# Patient Record
Sex: Female | Born: 1950 | Race: Asian | Hispanic: No | Smoking: Never smoker
Health system: Southern US, Community
[De-identification: ages and names within clinical notes are randomized; demographics above are authoritative.]

## PROBLEM LIST (undated history)

## (undated) DIAGNOSIS — C569 Malignant neoplasm of unspecified ovary: Secondary | ICD-10-CM

## (undated) DIAGNOSIS — E119 Type 2 diabetes mellitus without complications: Secondary | ICD-10-CM

## (undated) DIAGNOSIS — E079 Disorder of thyroid, unspecified: Secondary | ICD-10-CM

## (undated) DIAGNOSIS — I10 Essential (primary) hypertension: Secondary | ICD-10-CM

## (undated) DIAGNOSIS — E039 Hypothyroidism, unspecified: Secondary | ICD-10-CM

## (undated) DIAGNOSIS — I4891 Unspecified atrial fibrillation: Secondary | ICD-10-CM

## (undated) DIAGNOSIS — B019 Varicella without complication: Secondary | ICD-10-CM

## (undated) DIAGNOSIS — M1990 Unspecified osteoarthritis, unspecified site: Secondary | ICD-10-CM

## (undated) DIAGNOSIS — K219 Gastro-esophageal reflux disease without esophagitis: Secondary | ICD-10-CM

## (undated) DIAGNOSIS — G629 Polyneuropathy, unspecified: Secondary | ICD-10-CM

## (undated) DIAGNOSIS — R51 Headache: Secondary | ICD-10-CM

## (undated) DIAGNOSIS — R519 Headache, unspecified: Secondary | ICD-10-CM

## (undated) DIAGNOSIS — N6320 Unspecified lump in the left breast, unspecified quadrant: Secondary | ICD-10-CM

## (undated) DIAGNOSIS — E785 Hyperlipidemia, unspecified: Secondary | ICD-10-CM

## (undated) HISTORY — DX: Disorder of thyroid, unspecified: E07.9

## (undated) HISTORY — DX: Type 2 diabetes mellitus without complications: E11.9

## (undated) HISTORY — PX: CATARACT EXTRACTION W/ INTRAOCULAR LENS  IMPLANT, BILATERAL: SHX1307

## (undated) HISTORY — DX: Varicella without complication: B01.9

## (undated) HISTORY — PX: HYSTERECTOMY: SHX81

## (undated) HISTORY — PX: CHOLECYSTECTOMY: SHX55

## (undated) HISTORY — PX: APPENDECTOMY (OPEN): SHX54

---
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
## 1982-01-23 HISTORY — PX: APPENDECTOMY: SHX54

## 2009-01-23 HISTORY — PX: CHOLECYSTECTOMY: SHX55

## 2010-05-24 HISTORY — PX: ABDOMINAL HYSTERECTOMY: SHX81

## 2012-07-31 ENCOUNTER — Other Ambulatory Visit: Payer: Self-pay | Admitting: Occupational Medicine

## 2012-08-08 ENCOUNTER — Other Ambulatory Visit: Payer: Self-pay | Admitting: Occupational Medicine

## 2012-08-09 ENCOUNTER — Ambulatory Visit
Admission: RE | Admit: 2012-08-09 | Discharge: 2012-08-09 | Disposition: A | Discharge status: Home / Self Care | Payer: No Typology Code available for payment source | Source: Ambulatory Visit | Attending: Occupational Medicine | Admitting: Occupational Medicine

## 2012-08-09 DIAGNOSIS — R143 Flatulence: Secondary | ICD-10-CM | POA: Insufficient documentation

## 2012-08-09 DIAGNOSIS — R109 Unspecified abdominal pain: Secondary | ICD-10-CM | POA: Insufficient documentation

## 2012-08-09 DIAGNOSIS — R141 Gas pain: Secondary | ICD-10-CM | POA: Insufficient documentation

## 2012-08-09 DIAGNOSIS — Z1231 Encounter for screening mammogram for malignant neoplasm of breast: Secondary | ICD-10-CM | POA: Insufficient documentation

## 2013-01-20 ENCOUNTER — Ambulatory Visit: Payer: No Typology Code available for payment source | Admitting: Ophthalmology

## 2013-12-09 ENCOUNTER — Other Ambulatory Visit (INDEPENDENT_AMBULATORY_CARE_PROVIDER_SITE_OTHER): Payer: Self-pay | Admitting: Registered Nurse

## 2013-12-09 DIAGNOSIS — R221 Localized swelling, mass and lump, neck: Secondary | ICD-10-CM

## 2013-12-16 ENCOUNTER — Ambulatory Visit: Admission: RE | Admit: 2013-12-16 | Payer: Charity | Source: Ambulatory Visit

## 2015-07-20 DIAGNOSIS — J0191 Acute recurrent sinusitis, unspecified: Secondary | ICD-10-CM | POA: Diagnosis not present

## 2015-07-20 DIAGNOSIS — K219 Gastro-esophageal reflux disease without esophagitis: Secondary | ICD-10-CM | POA: Diagnosis not present

## 2015-07-20 DIAGNOSIS — E7800 Pure hypercholesterolemia, unspecified: Secondary | ICD-10-CM | POA: Diagnosis not present

## 2015-07-20 DIAGNOSIS — E1100 Type 2 diabetes mellitus with hyperosmolarity without nonketotic hyperglycemic-hyperosmolar coma (NKHHC): Secondary | ICD-10-CM | POA: Diagnosis not present

## 2015-07-20 DIAGNOSIS — I10 Essential (primary) hypertension: Secondary | ICD-10-CM | POA: Diagnosis not present

## 2015-08-10 DIAGNOSIS — M545 Low back pain: Secondary | ICD-10-CM | POA: Diagnosis not present

## 2015-08-10 DIAGNOSIS — R12 Heartburn: Secondary | ICD-10-CM | POA: Diagnosis not present

## 2015-08-10 DIAGNOSIS — I10 Essential (primary) hypertension: Secondary | ICD-10-CM | POA: Diagnosis not present

## 2015-08-10 DIAGNOSIS — I776 Arteritis, unspecified: Secondary | ICD-10-CM | POA: Diagnosis not present

## 2015-08-10 DIAGNOSIS — M6240 Contracture of muscle, unspecified site: Secondary | ICD-10-CM | POA: Diagnosis not present

## 2015-08-10 DIAGNOSIS — G894 Chronic pain syndrome: Secondary | ICD-10-CM | POA: Diagnosis not present

## 2015-12-30 ENCOUNTER — Encounter: Payer: Self-pay | Admitting: Nurse Practitioner

## 2015-12-30 ENCOUNTER — Ambulatory Visit (INDEPENDENT_AMBULATORY_CARE_PROVIDER_SITE_OTHER): Payer: Medicare Other | Admitting: Nurse Practitioner

## 2015-12-30 ENCOUNTER — Other Ambulatory Visit (INDEPENDENT_AMBULATORY_CARE_PROVIDER_SITE_OTHER): Payer: Medicare Other

## 2015-12-30 VITALS — BP 142/70 | HR 96 | Temp 98.5°F | Ht 62.0 in | Wt 155.0 lb

## 2015-12-30 DIAGNOSIS — E119 Type 2 diabetes mellitus without complications: Secondary | ICD-10-CM | POA: Insufficient documentation

## 2015-12-30 DIAGNOSIS — K219 Gastro-esophageal reflux disease without esophagitis: Secondary | ICD-10-CM | POA: Diagnosis not present

## 2015-12-30 DIAGNOSIS — R1031 Right lower quadrant pain: Secondary | ICD-10-CM

## 2015-12-30 DIAGNOSIS — I10 Essential (primary) hypertension: Secondary | ICD-10-CM | POA: Diagnosis not present

## 2015-12-30 DIAGNOSIS — G250 Essential tremor: Secondary | ICD-10-CM | POA: Insufficient documentation

## 2015-12-30 DIAGNOSIS — E785 Hyperlipidemia, unspecified: Secondary | ICD-10-CM

## 2015-12-30 DIAGNOSIS — Z794 Long term (current) use of insulin: Secondary | ICD-10-CM

## 2015-12-30 DIAGNOSIS — E118 Type 2 diabetes mellitus with unspecified complications: Secondary | ICD-10-CM | POA: Diagnosis not present

## 2015-12-30 DIAGNOSIS — R079 Chest pain, unspecified: Secondary | ICD-10-CM | POA: Diagnosis not present

## 2015-12-30 DIAGNOSIS — E039 Hypothyroidism, unspecified: Secondary | ICD-10-CM | POA: Insufficient documentation

## 2015-12-30 LAB — URINALYSIS, ROUTINE W REFLEX MICROSCOPIC
Bilirubin Urine: NEGATIVE
Hgb urine dipstick: NEGATIVE
Ketones, ur: NEGATIVE
Leukocytes, UA: NEGATIVE
Nitrite: NEGATIVE
PH: 5.5 (ref 5.0–8.0)
RBC / HPF: NONE SEEN (ref 0–?)
SPECIFIC GRAVITY, URINE: 1.015 (ref 1.000–1.030)
TOTAL PROTEIN, URINE-UPE24: NEGATIVE
URINE GLUCOSE: 100 — AB
Urobilinogen, UA: 0.2 (ref 0.0–1.0)

## 2015-12-30 LAB — T4, FREE: Free T4: 0.98 ng/dL (ref 0.60–1.60)

## 2015-12-30 LAB — HEMOGLOBIN A1C: HEMOGLOBIN A1C: 9.8 % — AB (ref 4.6–6.5)

## 2015-12-30 MED ORDER — SIMVASTATIN 20 MG PO TABS: 20.0000 mg | ORAL_TABLET | Freq: Every day | ORAL | 3 refills | Status: DC

## 2015-12-30 MED ORDER — METFORMIN HCL 1000 MG PO TABS: 1000.0000 mg | ORAL_TABLET | Freq: Two times a day (BID) | ORAL | 3 refills | Status: DC

## 2015-12-30 MED ORDER — DILTIAZEM HCL 30 MG PO TABS: 30.0000 mg | ORAL_TABLET | Freq: Two times a day (BID) | ORAL | 3 refills | Status: DC

## 2015-12-30 MED ORDER — ASPIRIN 81 MG PO CHEW: 81.0000 mg | CHEWABLE_TABLET | Freq: Every day | ORAL | 3 refills | Status: DC

## 2015-12-30 MED ORDER — OMEPRAZOLE 20 MG PO CPDR: 20.0000 mg | DELAYED_RELEASE_CAPSULE | Freq: Every day | ORAL | 3 refills | Status: DC

## 2015-12-30 MED ORDER — AMLODIPINE BESYLATE 5 MG PO TABS: 5.0000 mg | ORAL_TABLET | Freq: Every day | ORAL | 3 refills | Status: DC

## 2015-12-30 NOTE — Progress Notes (Signed)
Pre visit review using our clinic review tool, if applicable. No additional management support is needed unless otherwise documented below in the visit note. 

## 2015-12-30 NOTE — Progress Notes (Signed)
Subjective:    Patient ID: Barbara White, female    DOB: 20-Sep-1950, 65 y.o.   MRN: [id number]  Patient presents today for complete physical or establish care (new patient) She does not speak english. She is accompanied by sister who is translating. Unable to find bengali interpreter. Moved to [place] from [place] 55month ago.  Chest Pain   This is a recurrent problem. The current episode started more than 1 month ago. The onset quality is gradual. The problem occurs intermittently. The problem has been waxing and waning. The pain is present in the lateral region. The quality of the pain is described as pressure. The pain radiates to the left shoulder. Associated symptoms include abdominal pain. Pertinent negatives include no back pain, claudication, cough, dizziness, exertional chest pressure, fever, headaches, irregular heartbeat, leg pain, lower extremity edema, malaise/fatigue, nausea, numbness, orthopnea, palpitations, PND, shortness of breath or vomiting. Risk factors include post-menopausal and obesity.  Her family medical history is significant for CAD, diabetes, heart disease, hyperlipidemia, hypertension and stroke.  Abdominal Pain  This is a recurrent problem. The current episode started more than 1 month ago. The onset quality is gradual. The problem occurs intermittently. The problem has been waxing and waning. The pain is located in the periumbilical region, suprapubic region, RLQ and RUQ. The pain is at a severity of 3/10. The pain is mild. The quality of the pain is aching and cramping. The abdominal pain does not radiate. Pertinent negatives include no constipation, diarrhea, dysuria, fever, frequency, headaches, myalgias, nausea, vomiting or weight loss. Nothing aggravates the pain. The pain is relieved by nothing. She has tried nothing for the symptoms. Her past medical history is significant for GERD. There is no history of pancreatitis, PUD or ulcerative colitis.  s/p  cholescystectomy, total hysterectomy and appendectomy.  IImmunizations: (TDAP, Hep C screen, Pneumovax, Influenza, zoster)  Health Maintenance  Topic Date Due  .  Hepatitis C: One time screening is recommended by Center for Disease Control  (CDC) for  adults born from [birth year]through 1965.   [id number] . Complete foot exam   07/22/1960  . Eye exam for diabetics  07/22/1960  . Urine Protein Check  07/22/1960  . HIV Screening  07/22/1965  . Tetanus Vaccine  07/22/1969  . Pap Smear  07/23/1971  . Mammogram  07/22/2000  . Colon Cancer Screening  07/22/2000  . Shingles Vaccine  07/23/2010  . DEXA scan (bone density measurement)  07/23/2015  . Pneumonia vaccines (1 of 2 - PCV13) 07/23/2015  . Flu Shot  08/24/2015  . Hemoglobin A1C  06/29/2016   Diet:regular Weight:  Wt Readings from Last 3 Encounters:  12/30/15 155 lb (70.3 kg)   Exercise:none Fall Risk: Fall Risk  12/30/2015  Falls in the past year? No   Home Safety: home with sister [facility]  Decreased Interest 0  Down, Depressed, Hopeless 0  PHQ - 2 Score 0   Medications and allergies reviewed with patient and updated if appropriate.  Patient Active Problem List   Diagnosis Date Noted  . HTN (hypertension), benign 12/30/2015  . Hyperlipidemia 12/30/2015  . Diabetes mellitus ([facility] 12/30/2015  . Hypothyroidism 12/30/2015  . Essential tremor 12/30/2015    No current outpatient prescriptions on file prior to visit.   No current facility-administered medications on file prior to visit.     Past Medical History:  Diagnosis Date  . Chicken pox 1967  . Diabetes mellitus without complication ([place]   . Thyroid  disease     Past Surgical History:  Procedure Laterality Date  . ABDOMINAL HYSTERECTOMY  05/2010  . APPENDECTOMY  1984  . CHOLECYSTECTOMY  2011    Social History   Social History  . Marital status: Widowed    Spouse name: N/A  . Number of children: N/A  .  Years of education: N/A   Social History Main Topics  . Smoking status: Never Smoker  . Smokeless tobacco: Never Used  . Alcohol use No  . Drug use: No  . Sexual activity: Not Currently   Other Topics Concern  . None   Social History Narrative  . None    Family History  Problem Relation Age of Onset  . Stroke Father   . Diabetes Father   . Heart disease Father 38    with MI  . Hypertension Father   . Hyperlipidemia Father         Review of Systems  Constitutional: Negative for fever, malaise/fatigue and weight loss.  HENT: Negative for congestion and sore throat.   Eyes:       Negative for visual changes  Respiratory: Negative for cough and shortness of breath.   Cardiovascular: Positive for chest pain. Negative for palpitations, orthopnea, claudication, leg swelling and PND.  Gastrointestinal: Positive for abdominal pain and heartburn. Negative for blood in stool, constipation, diarrhea, nausea and vomiting.  Genitourinary: Negative for dysuria, frequency and urgency.  Musculoskeletal: Positive for neck pain. Negative for back pain, falls, joint pain and myalgias.  Skin: Negative for rash.  Neurological: Negative for dizziness, sensory change, numbness and headaches.  Endo/Heme/Allergies: Does not bruise/bleed easily.  Psychiatric/Behavioral: Negative for depression, substance abuse and suicidal ideas. The patient is not nervous/anxious.     Objective:   Vitals:   12/30/15 1506  BP: (!) 142/70  Pulse: 96  Temp: 98.5 F (36.9 C)    Body mass index is 28.35 kg/m.   Physical Examination:  Physical Exam  Constitutional: She is oriented to person, place, and time and well-developed, well-nourished, and in no distress. No distress.  HENT:  Right Ear: External ear normal.  Left Ear: External ear normal.  Nose: Nose normal.  Mouth/Throat: Oropharynx is clear and moist. No oropharyngeal exudate.  Eyes: Conjunctivae and EOM are normal. Pupils are equal,  round, and reactive to light. No scleral icterus.  Neck: Normal range of motion. Neck supple. No thyromegaly present.  Cardiovascular: Normal rate, normal heart sounds and intact distal pulses.   Pulmonary/Chest: Effort normal and breath sounds normal. She exhibits no tenderness.  Abdominal: Soft. Bowel sounds are normal. She exhibits no distension. There is tenderness. There is no rebound and no guarding.  R side of ABD  Musculoskeletal: Normal range of motion. She exhibits no edema or tenderness.  Lymphadenopathy:    She has no cervical adenopathy.  Neurological: She is alert and oriented to person, place, and time. Gait normal.  Skin: Skin is warm and dry.  Psychiatric: Affect and judgment normal.    ASSESSMENT and PLAN:   Normal ECG.  Kevia was seen today for establish care.  Diagnoses and all orders for this visit:  Chest pain, unspecified type -     EKG 12-Lead -     Ambulatory referral to Cardiology -     DG Chest 2 View; Future  HTN (hypertension), benign -     Comp Met (CMET); Future -     amLODipine (NORVASC) 5 MG tablet; Take 1 tablet (5 mg total) by mouth  daily. -     aspirin 81 MG chewable tablet; Chew 1 tablet (81 mg total) by mouth daily. -     diltiazem (CARDIZEM) 30 MG tablet; Take 1 tablet (30 mg total) by mouth 2 (two) times daily. -     Ambulatory referral to Cardiology  Hyperlipidemia, unspecified hyperlipidemia type -     Comp Met (CMET); Future -     simvastatin (ZOCOR) 20 MG tablet; Take 1 tablet (20 mg total) by mouth daily at 6 PM. -     CK total and CKMB (cardiac)not at [facility] -     Ambulatory referral to Cardiology  Type 2 diabetes mellitus with complication, with long-term current use of insulin (HCC) -     Comp Met (CMET); Future -     T4, free; Future -     Hemoglobin A1c; Future -     Microalbumin / creatinine urine ratio -     metFORMIN (GLUCOPHAGE) 1000 MG tablet; Take 1 tablet (1,000 mg total) by mouth 2 (two) times daily. -      Ambulatory referral to Cardiology  Hypothyroidism, unspecified type -     TSH; Future  RLQ abdominal pain -     Urinalysis, Routine w reflex microscopic; Future -     CT Abdomen Pelvis W Contrast; Future  Essential tremor  Gastroesophageal reflux disease, esophagitis presence not specified -     omeprazole (PRILOSEC) 20 MG capsule; Take 1 capsule (20 mg total) by mouth daily.   No problem-specific Assessment & Plan notes found for this encounter. Consider CT ABD/pelvis if labs are normal    Recent Results (from the past 2160 hour(s))  Comp Met (CMET)     Status: Abnormal   Collection Time: 12/30/15  5:03 PM  Result Value Ref Range   Sodium 136 135 - 145 mEq/L   Potassium 4.0 3.5 - 5.1 mEq/L   Chloride 100 96 - 112 mEq/L   CO2 27 19 - 32 mEq/L   Glucose, Bld 181 (H) 70 - 99 mg/dL   BUN 15 6 - 23 mg/dL   Creatinine, Ser 1.02 0.40 - 1.20 mg/dL   Total Bilirubin 0.3 0.2 - 1.2 mg/dL   Alkaline Phosphatase 54 39 - 117 U/L   AST 25 0 - 37 U/L   ALT 24 0 - 35 U/L   Total Protein 7.8 6.0 - 8.3 g/dL   Albumin 4.6 3.5 - 5.2 g/dL   Calcium 9.8 8.4 - 10.5 mg/dL   GFR 57.73 (L) >60.00 mL/min  TSH     Status: None   Collection Time: 12/30/15  5:03 PM  Result Value Ref Range   TSH 1.88 0.35 - 4.50 uIU/mL  T4, free     Status: None   Collection Time: 12/30/15  5:03 PM  Result Value Ref Range   Free T4 0.98 0.60 - 1.60 ng/dL    Comment: Specimens from patients who are undergoing biotin therapy and /or ingesting biotin supplements may contain high levels of biotin.  The higher biotin concentration in these specimens interferes with this Free T4 assay.  Specimens that contain high levels  of biotin may cause false high results for this Free T4 assay.  Please interpret results in light of the total clinical presentation of the patient.    Hemoglobin A1c     Status: Abnormal   Collection Time: 12/30/15  5:03 PM  Result Value Ref Range   Hgb A1c MFr Bld 9.8 (H) 4.6 - 6.5 %  Comment:  Glycemic Control Guidelines for People with Diabetes:Non Diabetic:  <6%Goal of Therapy: <7%Additional Action Suggested:  >8%   Urinalysis, Routine w reflex microscopic     Status: Abnormal   Collection Time: 12/30/15  5:03 PM  Result Value Ref Range   Color, Urine YELLOW Yellow;Lt. Yellow   APPearance CLEAR Clear   Specific Gravity, Urine 1.015 1.000 - 1.030   pH 5.5 5.0 - 8.0   Total Protein, Urine NEGATIVE Negative   Urine Glucose 100 (A) Negative   Ketones, ur NEGATIVE Negative   Bilirubin Urine NEGATIVE Negative   Hgb urine dipstick NEGATIVE Negative   Urobilinogen, UA 0.2 0.0 - 1.0   Leukocytes, UA NEGATIVE Negative   Nitrite NEGATIVE Negative   WBC, UA 0-2/hpf 0-2/hpf   RBC / HPF none seen 0-2/hpf   Squamous Epithelial / LPF Rare(0-4/hpf) Rare(0-4/hpf)   Follow up: Return in about 1 month (around 01/30/2016) for DM and HTN, hyperlipidemia.  Wilfred Lacy, NP

## 2015-12-30 NOTE — Patient Instructions (Addendum)
You will be called with lab results.  Go to hospital if chest pain worsens  Check blood glucose twice a day (before breakfast and before dinner), and record. Bring glucose reading to next Ov.  Normal EKG. You are to f/up with cardiology. You will be called to schedule appt.

## 2015-12-31 LAB — COMPREHENSIVE METABOLIC PANEL
ALBUMIN: 4.6 g/dL (ref 3.5–5.2)
ALK PHOS: 54 U/L (ref 39–117)
ALT: 24 U/L (ref 0–35)
AST: 25 U/L (ref 0–37)
BUN: 15 mg/dL (ref 6–23)
CHLORIDE: 100 meq/L (ref 96–112)
CO2: 27 mEq/L (ref 19–32)
Calcium: 9.8 mg/dL (ref 8.4–10.5)
Creatinine, Ser: 1.02 mg/dL (ref 0.40–1.20)
GFR: 57.73 mL/min — AB (ref >60.00)
Glucose, Bld: 181 mg/dL — ABNORMAL HIGH (ref 70–99)
POTASSIUM: 4 meq/L (ref 3.5–5.1)
Sodium: 136 mEq/L (ref 135–145)
TOTAL PROTEIN: 7.8 g/dL (ref 6.0–8.3)
Total Bilirubin: 0.3 mg/dL (ref 0.2–1.2)

## 2015-12-31 LAB — TSH: TSH: 1.88 u[IU]/mL (ref 0.35–4.50)

## 2016-01-05 ENCOUNTER — Telehealth: Payer: Self-pay | Admitting: Emergency Medicine

## 2016-01-05 DIAGNOSIS — E039 Hypothyroidism, unspecified: Secondary | ICD-10-CM

## 2016-01-05 MED ORDER — GLIPIZIDE 5 MG PO TABS: 5.0000 mg | ORAL_TABLET | Freq: Two times a day (BID) | ORAL | 3 refills | Status: DC

## 2016-01-05 MED ORDER — LEVOTHYROXINE SODIUM 50 MCG PO TABS: 50.0000 ug | ORAL_TABLET | Freq: Once | ORAL | 0 refills | Status: DC

## 2016-01-05 MED ORDER — INSULIN GLARGINE 100 UNIT/ML ~~LOC~~ SOLN: 25.0000 [IU] | Freq: Every day | SUBCUTANEOUS | 3 refills | Status: DC

## 2016-01-05 NOTE — Telephone Encounter (Signed)
[facility] patients levothyroxine (SYNTHROID, LEVOTHROID) 50 MCG tablet. They need clarification other the quanity. Please advise thanks.

## 2016-01-06 MED ORDER — INSULIN PEN NEEDLE 31G X 8 MM MISC: 11 refills | Status: DC

## 2016-01-06 NOTE — Telephone Encounter (Signed)
Rec'd call from pharmacist stating pt is wanting to change Lantus vials to pens. Gave ok to change to solostar pen, and inform him on the levothyroxine. Should be 1 pill a day #30. Updated chart...Raechel Chute/lmb

## 2016-01-11 ENCOUNTER — Encounter: Payer: Self-pay | Admitting: Nurse Practitioner

## 2016-01-13 ENCOUNTER — Ambulatory Visit
Admission: RE | Admit: 2016-01-13 | Discharge: 2016-01-13 | Disposition: A | Discharge status: Home / Self Care | Payer: Medicare Other | Source: Ambulatory Visit | Attending: Nurse Practitioner | Admitting: Nurse Practitioner

## 2016-01-13 DIAGNOSIS — R1031 Right lower quadrant pain: Secondary | ICD-10-CM | POA: Diagnosis not present

## 2016-01-13 IMAGING — CT CT ABD-PELV W/ CM
3 of 5 series · 13 of 36 positions shown, 19 images · IV contrast (agent unspecified)
Comparison: None.

CLINICAL DATA: Right lower quadrant pain

EXAM:
CT ABDOMEN AND PELVIS WITH CONTRAST
TECHNIQUE: Multidetector CT imaging of the abdomen and pelvis was performed
using the standard protocol following bolus administration of
intravenous contrast.
CONTRAST:  100 mL [4R].

[Series 3: abd/pelvis with · axial · 0.79mm/px · z∈[-344,-24]mm · 7 of 86 slices shown, 12 images]
[im 11/86  soft-tissue]
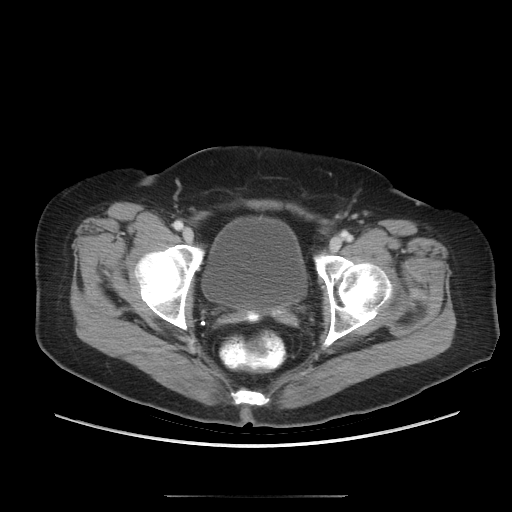
[im 11/86  bone]
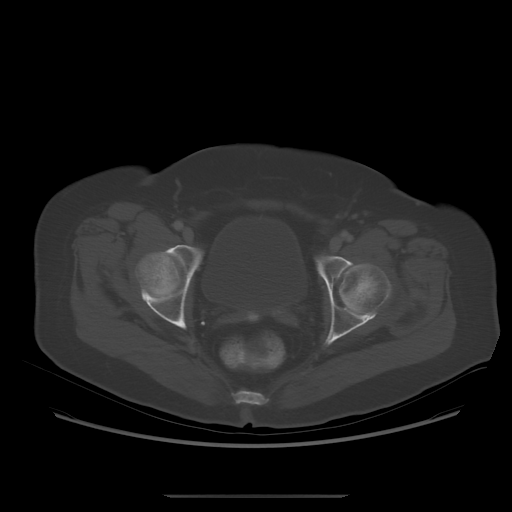
[im 22/86  soft-tissue]
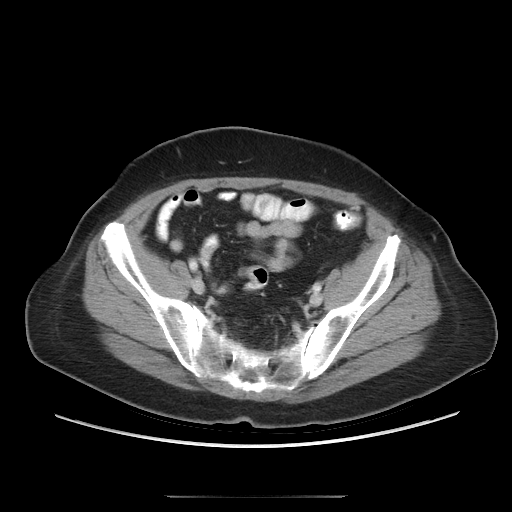
[im 32/86  soft-tissue]
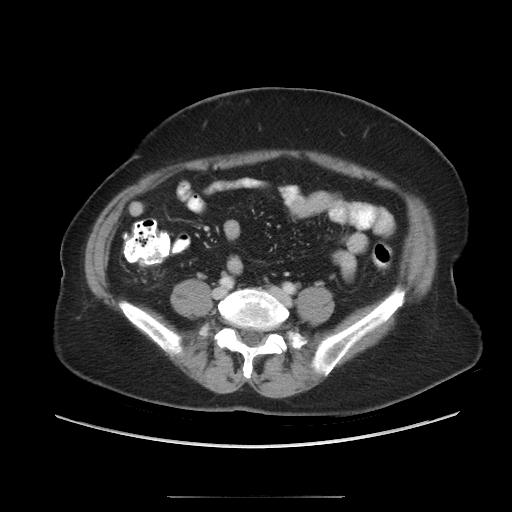
[im 43/86  soft-tissue]
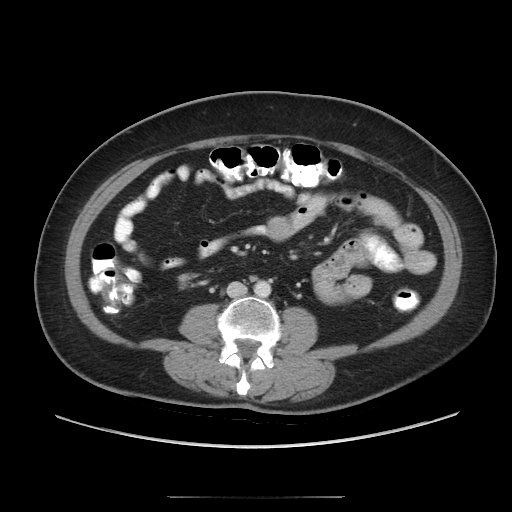
[im 43/86  lung]
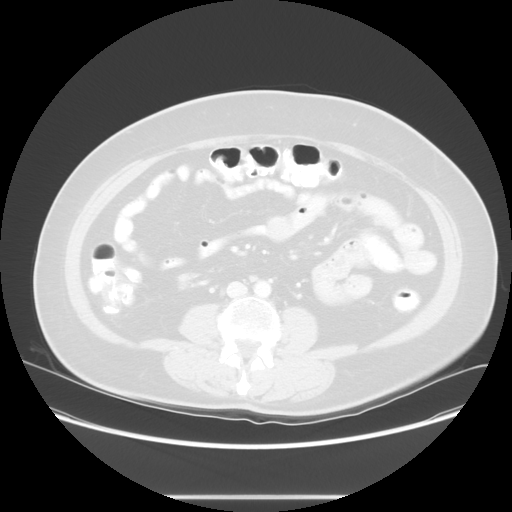
[im 54/86  soft-tissue]
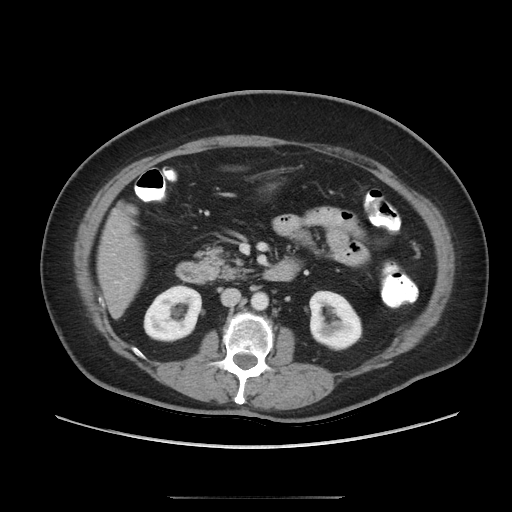
[im 54/86  lung]
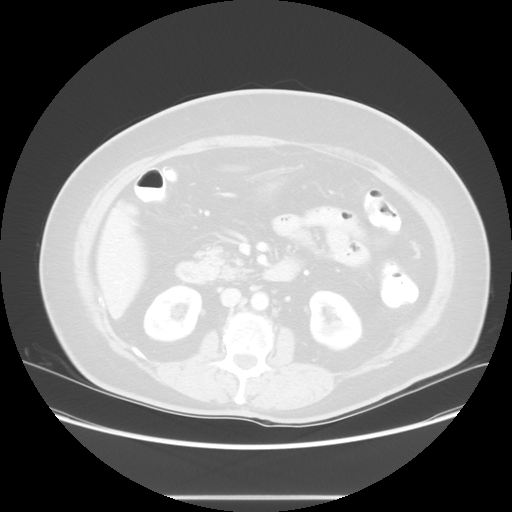
[im 64/86  soft-tissue]
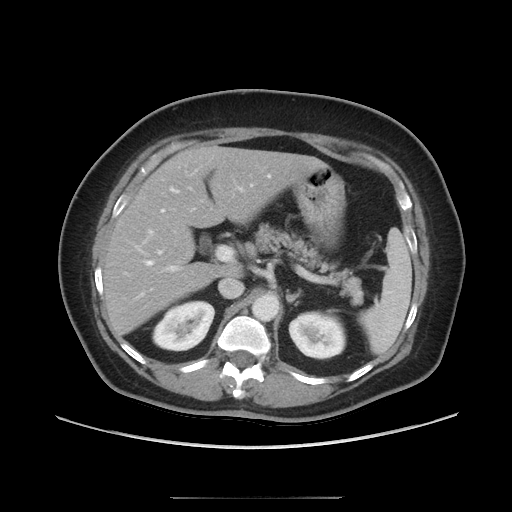
[im 64/86  lung]
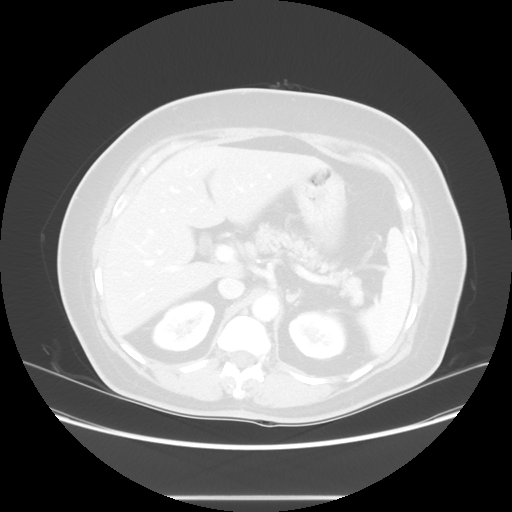
[im 75/86  soft-tissue]
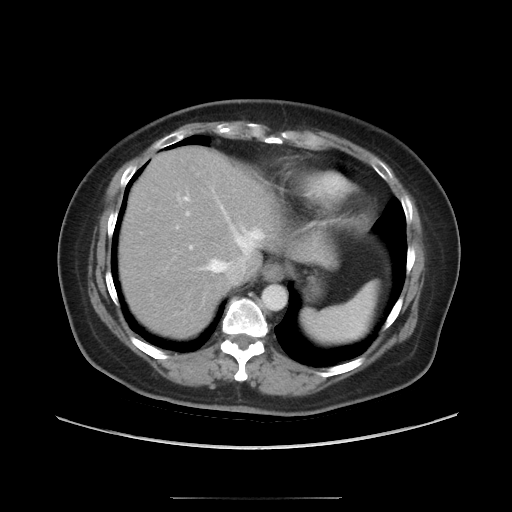
[im 75/86  lung]
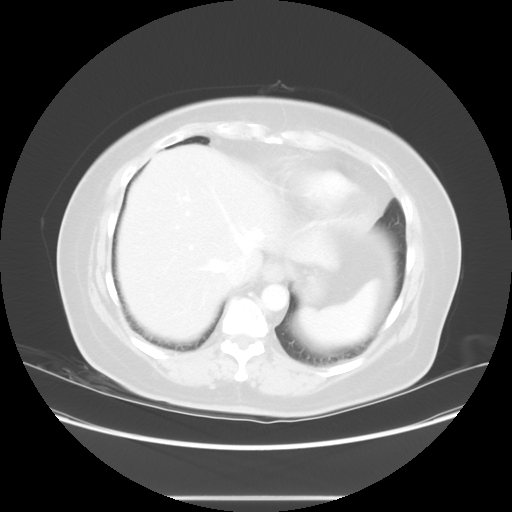

[Series 601: coronal body · coronal · 0.90mm/px · 1 of 118 slices shown, 2 images]
[im 40/118  soft-tissue]
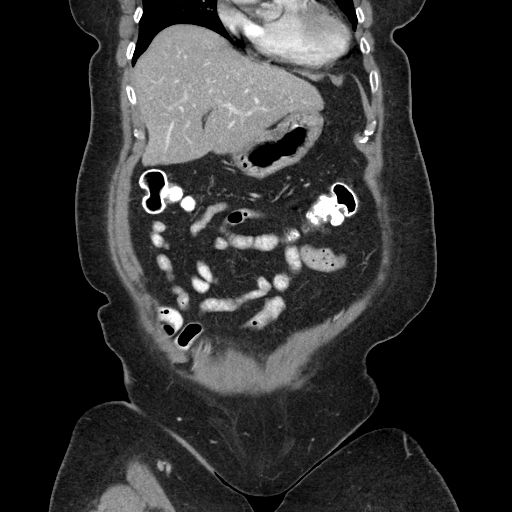
[im 40/118  bone]
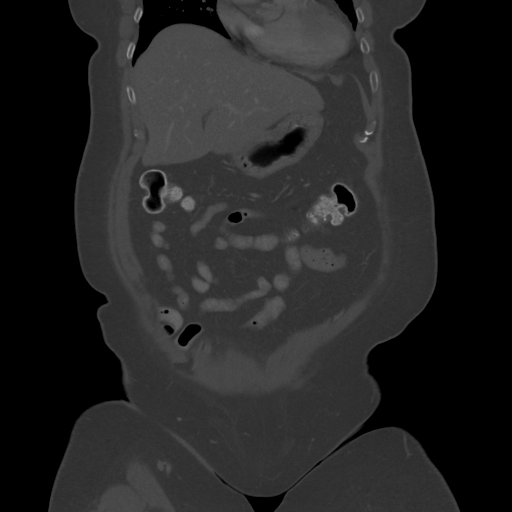

[Series 602: sagittal body · sagittal · 0.90mm/px · 5 of 155 slices shown]
[im 10/155  soft-tissue]
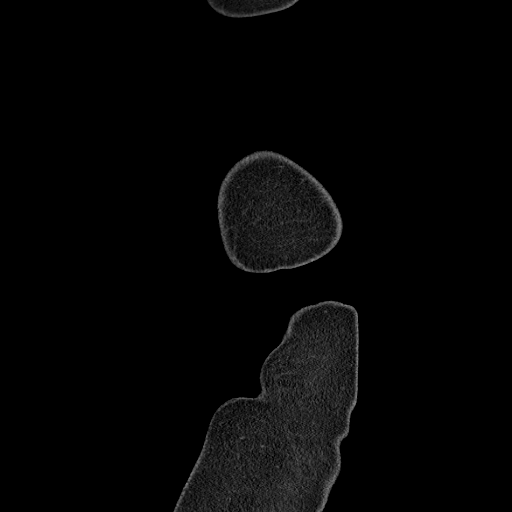
[im 29/155  soft-tissue]
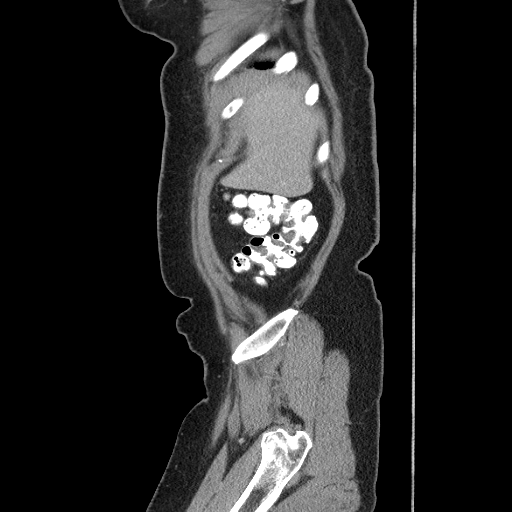
[im 49/155  soft-tissue]
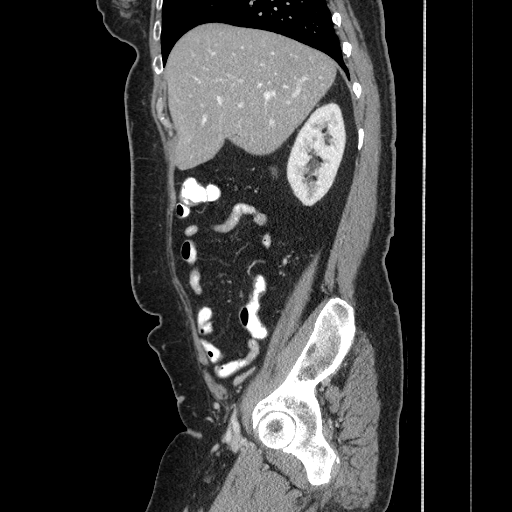
[im 68/155  soft-tissue]
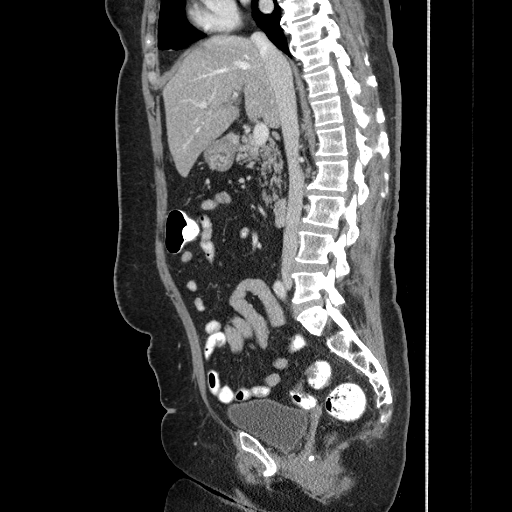
[im 87/155  soft-tissue]
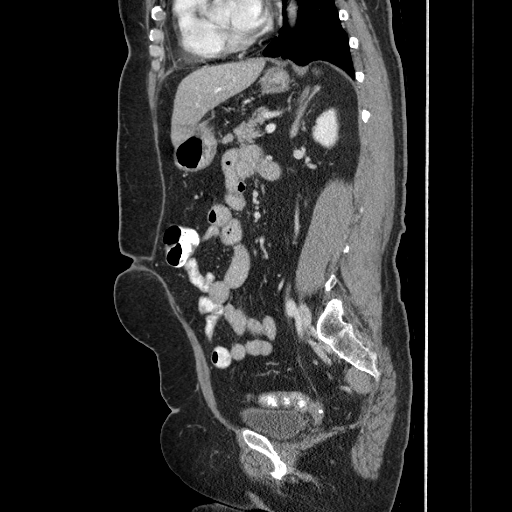

[13 of 36 positions shown; findings below may reference images not displayed]

FINDINGS: Lower chest: No acute abnormality.

Hepatobiliary: The liver is diffusely fatty infiltrated. The
gallbladder has been surgically removed.

Pancreas: Unremarkable. No pancreatic ductal dilatation or
surrounding inflammatory changes.

Spleen: Normal in size without focal abnormality.

Adrenals/Urinary Tract: The adrenal glands are within normal limits.
A few scattered small cysts are seen. No calculi or obstructive
changes are noted. The bladder is partially distended.

Stomach/Bowel: Scattered diverticular change of the colon is noted
without diverticulitis. The appendix is not visualized consistent
with a prior surgical history. No obstructive changes are seen.

Vascular/Lymphatic: Aortic atherosclerosis. No enlarged abdominal or
pelvic lymph nodes.

Reproductive: Status post hysterectomy. No adnexal masses.

Other: No abdominal wall hernia or abnormality. No abdominopelvic
ascites.

Musculoskeletal: No acute or significant osseous findings.
IMPRESSION: Chronic changes as described above without acute abnormality.

Postsurgical changes.

## 2016-01-13 MED ORDER — IOPAMIDOL (ISOVUE-300) INJECTION 61%: 100.0000 mL | Freq: Once | INTRAVENOUS | Status: DC | PRN

## 2016-01-24 HISTORY — PX: BREAST LUMPECTOMY: SHX2

## 2016-01-31 ENCOUNTER — Ambulatory Visit (INDEPENDENT_AMBULATORY_CARE_PROVIDER_SITE_OTHER): Payer: Medicare Other | Admitting: Nurse Practitioner

## 2016-01-31 ENCOUNTER — Encounter: Payer: Self-pay | Admitting: Nurse Practitioner

## 2016-01-31 VITALS — BP 132/70 | HR 87 | Temp 98.3°F | Resp 12 | Ht 62.0 in | Wt 154.1 lb

## 2016-01-31 DIAGNOSIS — Z23 Encounter for immunization: Secondary | ICD-10-CM | POA: Diagnosis not present

## 2016-01-31 DIAGNOSIS — E039 Hypothyroidism, unspecified: Secondary | ICD-10-CM | POA: Diagnosis not present

## 2016-01-31 DIAGNOSIS — Z794 Long term (current) use of insulin: Secondary | ICD-10-CM

## 2016-01-31 DIAGNOSIS — E118 Type 2 diabetes mellitus with unspecified complications: Secondary | ICD-10-CM | POA: Diagnosis not present

## 2016-01-31 DIAGNOSIS — E785 Hyperlipidemia, unspecified: Secondary | ICD-10-CM | POA: Diagnosis not present

## 2016-01-31 DIAGNOSIS — I10 Essential (primary) hypertension: Secondary | ICD-10-CM

## 2016-01-31 MED ORDER — BLOOD GLUCOSE MONITOR KIT: PACK | 0 refills | Status: DC

## 2016-01-31 MED ORDER — INSULIN GLARGINE 100 UNITS/ML SOLOSTAR PEN: 30.0000 [IU] | PEN_INJECTOR | Freq: Every day | SUBCUTANEOUS | 1 refills | Status: DC

## 2016-01-31 MED ORDER — LINAGLIPTIN 5 MG PO TABS: 5.0000 mg | ORAL_TABLET | Freq: Every day | ORAL | 3 refills | Status: DC

## 2016-01-31 NOTE — Progress Notes (Signed)
Pre visit review using our clinic review tool, if applicable. No additional management support is needed unless otherwise documented below in the visit note. 

## 2016-01-31 NOTE — Progress Notes (Signed)
Subjective:  Patient ID: Barbara White, female    DOB: [birth year]  Age: 66 y.o. MRN: [id number]  CC: Follow-up (following up on blood sugar, cholesterol, and thyroid)   patient in office with sister who is helping to interpret. Patient speaks little english.  Diabetes  She presents for her follow-up diabetic visit. She has type 2 diabetes mellitus. Disease course: uncontrolled. There are no hypoglycemic associated symptoms. Associated symptoms include foot paresthesias. Pertinent negatives for diabetes include no blurred vision, no chest pain, no fatigue, no foot ulcerations, no polydipsia, no polyphagia, no polyuria, no visual change, no weakness and no weight loss. There are no hypoglycemic complications. Symptoms are stable. There are no diabetic complications. Risk factors for coronary artery disease include dyslipidemia, diabetes mellitus, family history, hypertension, post-menopausal and sedentary lifestyle. Current diabetic treatment includes insulin injections and oral agent (dual therapy). She is compliant with treatment all of the time. Her weight is stable. She is following a generally unhealthy diet. When asked about meal planning, she reported none. She has not had a previous visit with a dietitian. She never participates in exercise. There is no change in her home blood glucose trend. Her breakfast blood glucose is taken between 8-9 am. Her breakfast blood glucose range is generally 180-200 mg/dl. Her dinner blood glucose is taken after 8 pm. Her dinner blood glucose range is generally >200 mg/dl. Her highest blood glucose is >200 mg/dl. Her overall blood glucose range is 180-200 mg/dl. An ACE inhibitor/angiotensin II receptor blocker is not being taken. She does not see a podiatrist.Eye exam is not current.    Outpatient Medications Prior to Visit  Medication Sig Dispense Refill  . amLODipine (NORVASC) 5 MG tablet Take 1 tablet (5 mg total) by mouth daily. 30 tablet 3  . aspirin 81 MG  chewable tablet Chew 1 tablet (81 mg total) by mouth daily. 30 tablet 3  . diltiazem (CARDIZEM) 30 MG tablet Take 1 tablet (30 mg total) by mouth 2 (two) times daily. 60 tablet 3  . levothyroxine (SYNTHROID, LEVOTHROID) 50 MCG tablet Take 1 tablet (50 mcg total) by mouth once. 1 tab po daily (Patient taking differently: Take 50 mcg by mouth daily before breakfast. ) 1 tablet 0  . metFORMIN (GLUCOPHAGE) 1000 MG tablet Take 1 tablet (1,000 mg total) by mouth 2 (two) times daily. 60 tablet 3  . omeprazole (PRILOSEC) 20 MG capsule Take 1 capsule (20 mg total) by mouth daily. 30 capsule 3  . simvastatin (ZOCOR) 20 MG tablet Take 1 tablet (20 mg total) by mouth daily at 6 PM. 30 tablet 3  . glipiZIDE (GLUCOTROL) 5 MG tablet Take 1 tablet (5 mg total) by mouth 2 (two) times daily before a meal. 1 tab PO twice a day 60 tablet 3  . Insulin Pen Needle (B-D ULTRAFINE III SHORT PEN) 31G X 8 MM MISC Use to administer insulin at bedtime w/Solostar Pen. Dx E11.9 (Patient not taking: Reported on 01/31/2016) 30 each 11   No facility-administered medications prior to visit.     ROS See HPI  Objective:  BP 132/70   Pulse 87   Temp 98.3 F (36.8 C) (Oral)   Resp 12   Ht 5' 2"  (1.575 m)   Wt 154 lb 1.9 oz (69.9 kg)   SpO2 97%   BMI 28.19 kg/m   BP Readings from Last 3 Encounters:  01/31/16 132/70  12/30/15 (!) 142/70    Wt Readings from Last 3 Encounters:  01/31/16 154 lb 1.9  oz (69.9 kg)  12/30/15 155 lb (70.3 kg)    Physical Exam  Constitutional: She is oriented to person, place, and time. No distress.  HENT:  Right Ear: External ear normal.  Left Ear: External ear normal.  Nose: Nose normal.  Mouth/Throat: No oropharyngeal exudate.  Eyes: No scleral icterus.  Neck: Normal range of motion. Neck supple. No thyromegaly present.  Cardiovascular: Normal rate, regular rhythm and normal heart sounds.   Pulmonary/Chest: Effort normal and breath sounds normal. No respiratory distress.  Abdominal:  Soft. She exhibits no distension. There is no tenderness.  Musculoskeletal: Normal range of motion. She exhibits no edema.  Lymphadenopathy:    She has no cervical adenopathy.  Neurological: She is alert and oriented to person, place, and time.  Diabetic foot exam done  Skin: Skin is warm and dry.  Psychiatric: She has a normal mood and affect. Her behavior is normal.  Vitals reviewed.   Lab Results  Component Value Date   GLUCOSE 181 (H) 12/30/2015   ALT 24 12/30/2015   AST 25 12/30/2015   NA 136 12/30/2015   K 4.0 12/30/2015   CL 100 12/30/2015   CREATININE 1.02 12/30/2015   BUN 15 12/30/2015   CO2 27 12/30/2015   TSH 1.88 12/30/2015   HGBA1C 9.8 (H) 12/30/2015    Ct Abdomen Pelvis W Contrast  Result Date: 01/13/2016 CLINICAL DATA:  Right lower quadrant pain EXAM: CT ABDOMEN AND PELVIS WITH CONTRAST TECHNIQUE: Multidetector CT imaging of the abdomen and pelvis was performed using the standard protocol following bolus administration of intravenous contrast. CONTRAST:  100 mL Isovue-300. COMPARISON:  None. FINDINGS: Lower chest: No acute abnormality. Hepatobiliary: The liver is diffusely fatty infiltrated. The gallbladder has been surgically removed. Pancreas: Unremarkable. No pancreatic ductal dilatation or surrounding inflammatory changes. Spleen: Normal in size without focal abnormality. Adrenals/Urinary Tract: The adrenal glands are within normal limits. A few scattered small cysts are seen. No calculi or obstructive changes are noted. The bladder is partially distended. Stomach/Bowel: Scattered diverticular change of the colon is noted without diverticulitis. The appendix is not visualized consistent with a prior surgical history. No obstructive changes are seen. Vascular/Lymphatic: Aortic atherosclerosis. No enlarged abdominal or pelvic lymph nodes. Reproductive: Status post hysterectomy. No adnexal masses. Other: No abdominal wall hernia or abnormality. No abdominopelvic ascites.  Musculoskeletal: No acute or significant osseous findings. IMPRESSION: Chronic changes as described above without acute abnormality. Postsurgical changes. Electronically Signed   By: Inez Catalina M.D.   On: 01/13/2016 09:55    Assessment & Plan:   Janine was seen today for follow-up.  Diagnoses and all orders for this visit:  Type 2 diabetes mellitus with complication, with long-term current use of insulin ([place]) -     blood glucose meter kit and supplies KIT; Dispense based on patient and insurance preference. Use up to four times daily as directed. (FOR ICD-9 250.00, 250.01). -     insulin glargine (LANTUS) 100 unit/mL SOPN; Inject 0.3 mLs (30 Units total) into the skin at bedtime. -     linagliptin (TRADJENTA) 5 MG TABS tablet; Take 1 tablet (5 mg total) by mouth daily. -     Ambulatory referral to diabetic education -     Comp Met (CMET); Future -     Hemoglobin A1c; Future -     Microalbumin / creatinine urine ratio; Future -     Ambulatory referral to Ophthalmology  Hyperlipidemia, unspecified hyperlipidemia type -     Lipid panel; Future -  Comp Met (CMET); Future  HTN (hypertension), benign -     Comp Met (CMET); Future  Hypothyroidism, unspecified type -     Comp Met (CMET); Future -     TSH; Future -     T4, free; Future  Other orders -     Flu vaccine HIGH DOSE PF   I have discontinued Ms. Schnoor's glipiZIDE. I have also changed her insulin glargine. Additionally, I am having her start on blood glucose meter kit and supplies and linagliptin. Lastly, I am having her maintain her amLODipine, aspirin, diltiazem, metFORMIN, simvastatin, omeprazole, levothyroxine, and Insulin Pen Needle.  Meds ordered this encounter  Medications  . DISCONTD: insulin glargine (LANTUS) 100 unit/mL SOPN    Sig: Inject 30 Units into the skin at bedtime.  . blood glucose meter kit and supplies KIT    Sig: Dispense based on patient and insurance preference. Use up to four times daily as  directed. (FOR ICD-9 250.00, 250.01).    Dispense:  1 each    Refill:  0    Order Specific Question:   Supervising Provider    Answer:   Cassandria Anger [[id number]]    Order Specific Question:   Number of strips    Answer:   100    Order Specific Question:   Number of lancets    Answer:   100  . insulin glargine (LANTUS) 100 unit/mL SOPN    Sig: Inject 0.3 mLs (30 Units total) into the skin at bedtime.    Dispense:  15 mL    Refill:  1    Order Specific Question:   Supervising Provider    Answer:   Cassandria Anger [[id number]]  . linagliptin (TRADJENTA) 5 MG TABS tablet    Sig: Take 1 tablet (5 mg total) by mouth daily.    Dispense:  30 tablet    Refill:  3    Order Specific Question:   Supervising Provider    Answer:   Cassandria Anger [[id number]]    Follow-up: Return in about 3 months (around 04/30/2016) for DM and HTN, hyperlipidemia (fasting, CPE).  Wilfred Lacy, NP

## 2016-01-31 NOTE — Patient Instructions (Addendum)
Continue blood sugar checks twice a day and record. Bring glucose readings to next office visit.  Increase Lantus dose to 30units at bedtime. Stop glucotrol Start tradjenta  Bring old immunization records, colonoscopy and mammogram reports

## 2016-02-09 ENCOUNTER — Ambulatory Visit: Payer: Medicare Other | Admitting: Cardiovascular Disease

## 2016-02-15 DIAGNOSIS — H353132 Nonexudative age-related macular degeneration, bilateral, intermediate dry stage: Secondary | ICD-10-CM | POA: Diagnosis not present

## 2016-02-15 DIAGNOSIS — H43391 Other vitreous opacities, right eye: Secondary | ICD-10-CM | POA: Diagnosis not present

## 2016-02-15 DIAGNOSIS — H5203 Hypermetropia, bilateral: Secondary | ICD-10-CM | POA: Diagnosis not present

## 2016-02-15 DIAGNOSIS — H52223 Regular astigmatism, bilateral: Secondary | ICD-10-CM | POA: Diagnosis not present

## 2016-02-15 DIAGNOSIS — E113293 Type 2 diabetes mellitus with mild nonproliferative diabetic retinopathy without macular edema, bilateral: Secondary | ICD-10-CM | POA: Diagnosis not present

## 2016-02-15 DIAGNOSIS — H524 Presbyopia: Secondary | ICD-10-CM | POA: Diagnosis not present

## 2016-02-15 DIAGNOSIS — H40023 Open angle with borderline findings, high risk, bilateral: Secondary | ICD-10-CM | POA: Diagnosis not present

## 2016-03-02 ENCOUNTER — Encounter: Payer: Self-pay | Admitting: Dietician

## 2016-03-02 ENCOUNTER — Encounter: Payer: Medicare Other | Attending: Nurse Practitioner | Admitting: Dietician

## 2016-03-02 DIAGNOSIS — Z794 Long term (current) use of insulin: Secondary | ICD-10-CM

## 2016-03-02 DIAGNOSIS — E118 Type 2 diabetes mellitus with unspecified complications: Secondary | ICD-10-CM

## 2016-03-02 DIAGNOSIS — I10 Essential (primary) hypertension: Secondary | ICD-10-CM | POA: Insufficient documentation

## 2016-03-02 DIAGNOSIS — E039 Hypothyroidism, unspecified: Secondary | ICD-10-CM | POA: Insufficient documentation

## 2016-03-02 DIAGNOSIS — E119 Type 2 diabetes mellitus without complications: Secondary | ICD-10-CM | POA: Insufficient documentation

## 2016-03-02 DIAGNOSIS — Z713 Dietary counseling and surveillance: Secondary | ICD-10-CM | POA: Insufficient documentation

## 2016-03-02 DIAGNOSIS — E785 Hyperlipidemia, unspecified: Secondary | ICD-10-CM | POA: Diagnosis not present

## 2016-03-02 NOTE — Patient Instructions (Addendum)
Continue to walk daily. Have small amount of protein with each meal and snack.  Protein choices are meat, chicken, fish, egg, yogurt, nuts, lentils, chick peas and other legumes. Be careful about the portion sizes.  Aim for 3 Carb Choices per meal (45 grams) +/- 1 either way  Aim for 0-1 Carbs per snack if hungry  Include protein in moderation with your meals and snacks Consider reading food labels for Total Carbohydrate and Fat Grams of foods Continue checking BG at alternate times per day as directed by MD  Continue taking medication as directed by MD

## 2016-03-03 NOTE — Progress Notes (Signed)
Diabetes Self-Management Education  Visit Type: First/Initial  Appt. Start Time: 1345 Appt. End Time: 1500  03/03/2016  [name], identified by name and date of birth, is a 66 y.o. female with a diagnosis of Diabetes: Type 2.  Other hx includes hypothyroidism, HTN, and hyperlipidemia.  Medications include Lantus 30 units q HS, Tradjenta, and Metformin.  Her last A1C was 9.8%.  She ran out of medications for 3 weeks secondary to insurance change with move from [place].    Patient lives with her sister and brother in Social worker.  Her sister does the cooking and shopping.  Patient is from [place] and understands much [place] and can read [place].  Sister did some interpreting when needed and interpretor waiver was signed.  Her sister describes her eating as picky, sometimes she will fast or not eat because she does not like the food.  Her sister cooks with little salt.  ASSESSMENT  Height 5\' 2"  (1.575 m), weight 152 lb (68.9 kg). Body mass index is 27.8 kg/m.      Diabetes Self-Management Education - 03/02/16 1350      Visit Information   Visit Type First/Initial     Initial Visit   Diabetes Type Type 2   Are you currently following a meal plan? No   Are you taking your medications as prescribed? Yes   Date Diagnosed 1998     Health Coping   How would you rate your overall health? Good     Psychosocial Assessment   Patient Belief/Attitude about Diabetes Motivated to manage diabetes   Self-care barriers English as a second language   Self-management support Doctor's office;Family   Other persons present Patient;Family Member  sister   Patient Concerns Nutrition/Meal planning;Glycemic Control   Special Needs Other (comment)  needs interpreter   Preferred Learning Style No preference indicated   Learning Readiness Ready   How often do you need to have someone help you when you read instructions, pamphlets, or other written materials from your doctor or pharmacy? 3 -  Sometimes  interpreter, speaks Bengali   What is the last grade level you completed in school? 4 years college     Pre-Education Assessment   Patient understands the diabetes disease and treatment process. Needs Instruction   Patient understands incorporating nutritional management into lifestyle. Needs Instruction   Patient undertands incorporating physical activity into lifestyle. Needs Instruction   Patient understands using medications safely. Needs Instruction   Patient understands monitoring blood glucose, interpreting and using results Needs Instruction   Patient understands prevention, detection, and treatment of acute complications. Needs Instruction   Patient understands prevention, detection, and treatment of chronic complications. Needs Instruction   Patient understands how to develop strategies to address psychosocial issues. Needs Instruction   Patient understands how to develop strategies to promote health/change behavior. Needs Instruction     Complications   Last HgB A1C per patient/outside source 9.8 %  12/30/2015   Fasting Blood glucose range (mg/dL) 16-109   Postprandial Blood glucose range (mg/dL) >604  around 540   Number of hypoglycemic episodes per month 0   Number of hyperglycemic episodes per week 14   Can you tell when your blood sugar is high? Yes   What do you do if your blood sugar is high? tries to relax, drink water   Have you had a dilated eye exam in the past 12 months? Yes   Have you had a dental exam in the past 12 months? Yes  Are you checking your feet? Yes   How many days per week are you checking your feet? 1     Dietary Intake   Breakfast oatmeal with salt, 2% milk, [employer] bread and butter or jelly, 1/2 banana or pineapple or pomelo or guava AND sometimes egg  9-9:30   Snack (morning) yogurt with chickpeas or fruit  12   Lunch rice, vegetable, fish or meat  2   Snack (afternoon) cookie or Bangladeshindian snack  4:30   Dinner [employer] Chipati (1-2), rice   7:30   Snack (evening) none   Beverage(s) coffee with powdered creamer, water     Exercise   Exercise Type Light (walking / raking leaves)   How many days per week to you exercise? 7   How many minutes per day do you exercise? 30   Total minutes per week of exercise 210     Patient Education   Previous Diabetes Education Yes (please comment)   Disease state  Definition of diabetes, type 1 and 2, and the diagnosis of diabetes   Nutrition management  Role of diet in the treatment of diabetes and the relationship between the three main macronutrients and blood glucose level;Food label reading, portion sizes and measuring food.;Carbohydrate counting;Meal options for control of blood glucose level and chronic complications.;Meal timing in regards to the patients' current diabetes medication.   Physical activity and exercise  Role of exercise on diabetes management, blood pressure control and cardiac health.;Helped patient identify appropriate exercises in relation to his/her diabetes, diabetes complications and other health issue.   Monitoring Purpose and frequency of SMBG.;Identified appropriate SMBG and/or A1C goals.   Acute complications Taught treatment of hypoglycemia - the 15 rule.   Chronic complications Assessed and discussed foot care and prevention of foot problems;Relationship between chronic complications and blood glucose control;Retinopathy and reason for yearly dilated eye exams;Dental care   Psychosocial adjustment Worked with patient to identify barriers to care and solutions   Personal strategies to promote health Lifestyle issues that need to be addressed for better diabetes care     Individualized Goals (developed by patient)   Nutrition General guidelines for healthy choices and portions discussed   Physical Activity Exercise 3-5 times per week;30 minutes per day   Medications take my medication as prescribed   Monitoring  test my blood glucose as discussed   Reducing Risk  examine blood glucose patterns     Post-Education Assessment   Patient understands the diabetes disease and treatment process. Demonstrates understanding / competency   Patient understands incorporating nutritional management into lifestyle. Demonstrates understanding / competency   Patient undertands incorporating physical activity into lifestyle. Demonstrates understanding / competency   Patient understands using medications safely. Demonstrates understanding / competency   Patient understands monitoring blood glucose, interpreting and using results Demonstrates understanding / competency   Patient understands prevention, detection, and treatment of acute complications. Demonstrates understanding / competency   Patient understands prevention, detection, and treatment of chronic complications. Demonstrates understanding / competency   Patient understands how to develop strategies to address psychosocial issues. Demonstrates understanding / competency   Patient understands how to develop strategies to promote health/change behavior. Demonstrates understanding / competency     Outcomes   Expected Outcomes Demonstrated interest in learning. Expect positive outcomes   Future DMSE PRN   Program Status Completed      Individualized Plan for Diabetes Self-Management Training:   Learning Objective:  Patient will have a greater understanding of diabetes self-management. Patient education  plan is to attend individual and/or group sessions per assessed needs and concerns.   Plan:   Patient Instructions  Continue to walk daily. Have small amount of protein with each meal and snack.  Protein choices are meat, chicken, fish, egg, yogurt, nuts, lentils, chick peas and other legumes. Be careful about the portion sizes.  Aim for 3 Carb Choices per meal (45 grams) +/- 1 either way  Aim for 0-1 Carbs per snack if hungry  Include protein in moderation with your meals and snacks Consider reading food  labels for Total Carbohydrate and Fat Grams of foods Continue checking BG at alternate times per day as directed by MD  Continue taking medication as directed by MD        Expected Outcomes:  Demonstrated interest in learning. Expect positive outcomes  Education material provided: Living Well with Diabetes, Food label handouts, A1C conversion sheet, Meal plan card, My Plate and Snack sheet  If problems or questions, patient to contact team via:  Phone and Email  Future DSME appointment: PRN

## 2016-03-08 ENCOUNTER — Telehealth: Payer: Self-pay | Admitting: Nurse Practitioner

## 2016-03-08 NOTE — Telephone Encounter (Signed)
OK. Thx

## 2016-03-08 NOTE — Telephone Encounter (Signed)
ok 

## 2016-03-08 NOTE — Telephone Encounter (Signed)
Patients sister called in. She is a patient of yours. They state you informed them she could tranfers care from [name] to you. Is this ok with you Dr. Macario GoldsPlot?  Claris GowerCharlotte is that ok with you?  [phone] - Barbara White - The sister

## 2016-03-09 NOTE — Telephone Encounter (Signed)
Patient has an appointment April 5th.

## 2016-03-20 ENCOUNTER — Ambulatory Visit: Payer: Medicare Other | Admitting: Cardiovascular Disease

## 2016-04-26 ENCOUNTER — Other Ambulatory Visit: Payer: Self-pay | Admitting: Nurse Practitioner

## 2016-04-26 DIAGNOSIS — E785 Hyperlipidemia, unspecified: Secondary | ICD-10-CM

## 2016-04-26 DIAGNOSIS — E118 Type 2 diabetes mellitus with unspecified complications: Secondary | ICD-10-CM

## 2016-04-26 DIAGNOSIS — Z794 Long term (current) use of insulin: Secondary | ICD-10-CM

## 2016-04-26 DIAGNOSIS — I10 Essential (primary) hypertension: Secondary | ICD-10-CM

## 2016-04-27 ENCOUNTER — Encounter: Payer: Self-pay | Admitting: Internal Medicine

## 2016-04-27 ENCOUNTER — Ambulatory Visit (INDEPENDENT_AMBULATORY_CARE_PROVIDER_SITE_OTHER): Payer: Medicare Other | Admitting: Internal Medicine

## 2016-04-27 DIAGNOSIS — I10 Essential (primary) hypertension: Secondary | ICD-10-CM

## 2016-04-27 DIAGNOSIS — Z794 Long term (current) use of insulin: Secondary | ICD-10-CM | POA: Diagnosis not present

## 2016-04-27 DIAGNOSIS — E118 Type 2 diabetes mellitus with unspecified complications: Secondary | ICD-10-CM | POA: Diagnosis not present

## 2016-04-27 DIAGNOSIS — E034 Atrophy of thyroid (acquired): Secondary | ICD-10-CM

## 2016-04-27 MED ORDER — EMPAGLIFLOZIN-METFORMIN HCL 5-1000 MG PO TABS: 1.0000 | ORAL_TABLET | Freq: Two times a day (BID) | ORAL | 11 refills | Status: DC

## 2016-04-27 NOTE — Progress Notes (Signed)
Subjective:  Patient ID: Barbara White, female    DOB: [birth year]  Age: 66 y.o. MRN: [id number]  CC: Rash (Pt stated itching/redness neck/chest for 2 1/2 weeks); Medication Refill; and Annual Exam   HPI Lola Czerwonka presents for DM CBG 250s in pm and 120s in am  Outpatient Medications Prior to Visit  Medication Sig Dispense Refill  . amLODipine (NORVASC) 5 MG tablet TAKE ONE TABLET BY MOUTH DAILY 30 tablet 0  . aspirin 81 MG chewable tablet Chew 1 tablet (81 mg total) by mouth daily. 30 tablet 3  . blood glucose meter kit and supplies KIT Dispense based on patient and insurance preference. Use up to four times daily as directed. (FOR ICD-9 250.00, 250.01). 1 each 0  . diltiazem (CARDIZEM) 30 MG tablet TAKE ONE TABLET BY MOUTH TWICE A DAY 60 tablet 0  . insulin glargine (LANTUS) 100 unit/mL SOPN Inject 0.3 mLs (30 Units total) into the skin at bedtime. 15 mL 1  . Insulin Pen Needle (B-D ULTRAFINE III SHORT PEN) 31G X 8 MM MISC Use to administer insulin at bedtime w/Solostar Pen. Dx E11.9 30 each 11  . linagliptin (TRADJENTA) 5 MG TABS tablet Take 1 tablet (5 mg total) by mouth daily. 30 tablet 3  . metFORMIN (GLUCOPHAGE) 1000 MG tablet TAKE ONE TABLET BY MOUTH TWICE A DAY 60 tablet 0  . omeprazole (PRILOSEC) 20 MG capsule Take 1 capsule (20 mg total) by mouth daily. 30 capsule 3  . simvastatin (ZOCOR) 20 MG tablet TAKE ONE TABLET BY MOUTH DAILY AT 6PM 30 tablet 0  . levothyroxine (SYNTHROID, LEVOTHROID) 50 MCG tablet Take 1 tablet (50 mcg total) by mouth once. 1 tab po daily (Patient taking differently: Take 50 mcg by mouth daily before breakfast. ) 1 tablet 0   No facility-administered medications prior to visit.     ROS Review of Systems  Constitutional: Negative for activity change, appetite change, chills, fatigue and unexpected weight change.  HENT: Negative for congestion, mouth sores and sinus pressure.   Eyes: Negative for visual disturbance.  Respiratory: Negative for cough  and chest tightness.   Gastrointestinal: Negative for abdominal pain and nausea.  Genitourinary: Negative for difficulty urinating, frequency and vaginal pain.  Musculoskeletal: Negative for back pain and gait problem.  Skin: Negative for pallor and rash.  Neurological: Negative for dizziness, tremors, weakness, numbness and headaches.  Psychiatric/Behavioral: Negative for confusion and sleep disturbance.    Objective:  BP 132/78   Pulse 88   Temp 98.6 F (37 C)   Ht 5' 2"  (1.575 m)   Wt 149 lb (67.6 kg)   SpO2 98%   BMI 27.25 kg/m   BP Readings from Last 3 Encounters:  04/27/16 132/78  01/31/16 132/70  12/30/15 (!) 142/70    Wt Readings from Last 3 Encounters:  04/27/16 149 lb (67.6 kg)  03/02/16 152 lb (68.9 kg)  01/31/16 154 lb 1.9 oz (69.9 kg)    Physical Exam  Lab Results  Component Value Date   GLUCOSE 181 (H) 12/30/2015   ALT 24 12/30/2015   AST 25 12/30/2015   NA 136 12/30/2015   K 4.0 12/30/2015   CL 100 12/30/2015   CREATININE 1.02 12/30/2015   BUN 15 12/30/2015   CO2 27 12/30/2015   TSH 1.88 12/30/2015   HGBA1C 9.8 (H) 12/30/2015    Ct Abdomen Pelvis W Contrast  Result Date: 01/13/2016 CLINICAL DATA:  Right lower quadrant pain EXAM: CT ABDOMEN AND PELVIS WITH CONTRAST TECHNIQUE:  Multidetector CT imaging of the abdomen and pelvis was performed using the standard protocol following bolus administration of intravenous contrast. CONTRAST:  100 mL Isovue-300. COMPARISON:  None. FINDINGS: Lower chest: No acute abnormality. Hepatobiliary: The liver is diffusely fatty infiltrated. The gallbladder has been surgically removed. Pancreas: Unremarkable. No pancreatic ductal dilatation or surrounding inflammatory changes. Spleen: Normal in size without focal abnormality. Adrenals/Urinary Tract: The adrenal glands are within normal limits. A few scattered small cysts are seen. No calculi or obstructive changes are noted. The bladder is partially distended.  Stomach/Bowel: Scattered diverticular change of the colon is noted without diverticulitis. The appendix is not visualized consistent with a prior surgical history. No obstructive changes are seen. Vascular/Lymphatic: Aortic atherosclerosis. No enlarged abdominal or pelvic lymph nodes. Reproductive: Status post hysterectomy. No adnexal masses. Other: No abdominal wall hernia or abnormality. No abdominopelvic ascites. Musculoskeletal: No acute or significant osseous findings. IMPRESSION: Chronic changes as described above without acute abnormality. Postsurgical changes. Electronically Signed   By: Inez Catalina M.D.   On: 01/13/2016 09:55    Assessment & Plan:   There are no diagnoses linked to this encounter. I am having Ms. Dipierro maintain her aspirin, omeprazole, levothyroxine, Insulin Pen Needle, blood glucose meter kit and supplies, insulin glargine, linagliptin, amLODipine, diltiazem, simvastatin, and metFORMIN.  No orders of the defined types were placed in this encounter.    Follow-up: No Follow-up on file.  Walker Kehr, MD

## 2016-04-27 NOTE — Assessment & Plan Note (Signed)
Lantus  D/c trigenta, metformin  Start [place] instead

## 2016-04-27 NOTE — Patient Instructions (Signed)
Stop trigenta, metformin  Start Synjardy instead

## 2016-04-27 NOTE — Assessment & Plan Note (Signed)
Amlodipine Diltiazem

## 2016-04-27 NOTE — Assessment & Plan Note (Signed)
On Levothroid 

## 2016-04-28 ENCOUNTER — Telehealth: Payer: Self-pay | Admitting: Nurse Practitioner

## 2016-04-28 NOTE — Telephone Encounter (Signed)
PA for Synjardy started and waiting for result  Barbara White (Key: Bells) - [id number]  Karin Golden stated alternative could be Invokamet and Xigduo xr.

## 2016-05-01 ENCOUNTER — Ambulatory Visit: Payer: Medicare Other | Admitting: Nurse Practitioner

## 2016-05-02 ENCOUNTER — Encounter: Payer: Medicare Other | Admitting: Nurse Practitioner

## 2016-05-02 ENCOUNTER — Other Ambulatory Visit: Payer: Self-pay | Admitting: Nurse Practitioner

## 2016-05-02 DIAGNOSIS — E118 Type 2 diabetes mellitus with unspecified complications: Secondary | ICD-10-CM

## 2016-05-02 DIAGNOSIS — I10 Essential (primary) hypertension: Secondary | ICD-10-CM

## 2016-05-02 DIAGNOSIS — Z794 Long term (current) use of insulin: Secondary | ICD-10-CM

## 2016-05-02 DIAGNOSIS — K219 Gastro-esophageal reflux disease without esophagitis: Secondary | ICD-10-CM

## 2016-05-02 DIAGNOSIS — E785 Hyperlipidemia, unspecified: Secondary | ICD-10-CM

## 2016-05-02 DIAGNOSIS — E039 Hypothyroidism, unspecified: Secondary | ICD-10-CM

## 2016-05-02 NOTE — Telephone Encounter (Signed)
PA was denied, filled out appeal form and sent to plan. Waiting for result.

## 2016-05-03 NOTE — Telephone Encounter (Signed)
welcare called in [phone] Barbara White   In reference to synjardy. Need to know if pt has tried and failed at least 2 of these meds??    indokamet janumet repaglinide zigduro xr

## 2016-05-03 NOTE — Telephone Encounter (Signed)
Spoke with Charmaine, gave her information she needed. Still waiting for response.

## 2016-05-03 NOTE — Telephone Encounter (Signed)
Routing to dr [facility]   ---I am ok with refilling all requests except Tragenta---I dont sow this med on patients current med list----please advise about tragenta--thanks

## 2016-05-03 NOTE — Telephone Encounter (Signed)
Charmaine from [employer] call regarding the appeal that was sent. She would like to know if the pt has ever tried xigduo, janumet or invokamet. I do not see them on her med list. Please advise.

## 2016-05-04 NOTE — Telephone Encounter (Signed)
Please forward to pcp. I am not longer her pcp.

## 2016-05-04 NOTE — Telephone Encounter (Signed)
Left detail massage inform pt that PA is approve and pharmacy will notify her once it is ready.

## 2016-05-05 NOTE — Telephone Encounter (Signed)
Done. Thx.

## 2016-05-09 ENCOUNTER — Other Ambulatory Visit: Payer: Self-pay | Admitting: Nurse Practitioner

## 2016-05-09 DIAGNOSIS — E118 Type 2 diabetes mellitus with unspecified complications: Secondary | ICD-10-CM

## 2016-05-09 DIAGNOSIS — Z794 Long term (current) use of insulin: Secondary | ICD-10-CM

## 2016-05-10 NOTE — Telephone Encounter (Signed)
Routing to dr [facility], I don't see tradjenta on patient's current med list----please advise, thanks

## 2016-05-17 ENCOUNTER — Other Ambulatory Visit: Payer: Self-pay | Admitting: Internal Medicine

## 2016-05-17 NOTE — Telephone Encounter (Signed)
Called regarding PA for insulin glargine (LANTUS) 100 unit/mL SOPN   They do not cover lantus.   Would like to know if the Pt has tried   Art therapist

## 2016-05-17 NOTE — Telephone Encounter (Signed)
OK to use Levimir - see Rx Thx

## 2016-05-17 NOTE — Telephone Encounter (Signed)
PA had been denied. Looks like patient has only been on Lantus. Would you like to switch to on of the named below which in on formulary?

## 2016-05-18 MED ORDER — INSULIN DETEMIR 100 UNIT/ML FLEXPEN: 30.0000 [IU] | PEN_INJECTOR | Freq: Every day | SUBCUTANEOUS | 11 refills | Status: DC

## 2016-05-18 NOTE — Telephone Encounter (Signed)
RX sent

## 2016-05-23 ENCOUNTER — Encounter: Payer: Self-pay | Admitting: Cardiovascular Disease

## 2016-05-23 ENCOUNTER — Ambulatory Visit (INDEPENDENT_AMBULATORY_CARE_PROVIDER_SITE_OTHER): Payer: Medicare Other | Admitting: Cardiovascular Disease

## 2016-05-23 VITALS — BP 128/74 | HR 77 | Ht 62.0 in | Wt 145.2 lb

## 2016-05-23 DIAGNOSIS — E785 Hyperlipidemia, unspecified: Secondary | ICD-10-CM | POA: Diagnosis not present

## 2016-05-23 DIAGNOSIS — I10 Essential (primary) hypertension: Secondary | ICD-10-CM | POA: Diagnosis not present

## 2016-05-23 DIAGNOSIS — R0789 Other chest pain: Secondary | ICD-10-CM | POA: Diagnosis not present

## 2016-05-23 MED ORDER — LOSARTAN POTASSIUM 50 MG PO TABS: 50.0000 mg | ORAL_TABLET | Freq: Every day | ORAL | 3 refills | Status: DC

## 2016-05-23 NOTE — Patient Instructions (Signed)
Medication Instructions:  STOP Amlodipine  START Losartan 50 mg once daily   Labwork: Your physician recommends that you return for lab work in: 3 weeks for basic metabolic panel   Testing/Procedures: None Ordered   Follow-Up: Your physician wants you to follow-up in: 6 months with Dr. Elease Hashimoto.  You will receive a reminder letter in the mail two months in advance. If you don't receive a letter, please call our office to schedule the follow-up appointment.   If you need a refill on your cardiac medications before your next appointment, please call your pharmacy.   Thank you for choosing [facility]! Eligha Bridegroom, RN [phone]

## 2016-05-23 NOTE — Progress Notes (Signed)
Cardiology Office Note   Date:  05/23/2016   ID:  Barbara White, DOB Dec 11, 1950, MRN [id number]  PCP:  Walker Kehr, MD  Cardiologist:   Mertie Moores, MD   Chief Complaint  Patient presents with  . Chest Pain   Problem list 1. Chest pain 2. Hypertension 3. Hyperlipidemia 4. Diabetes mellitus 5. Hypothyroidism   History of Present Illness:  Barbara White is a 66 y.o. female who is being seen today for the evaluation of chest pain  at the request of Nche, Charlene Brooke, NP.  Seen with her sister, Barbara White.  (who acts as Quarry manager) Originally from [place]  Occasional episodes of CP Has frequent episodes of fatigue .  Glucose goes high on occasion   Walks daily , no pain with exertion .  Walks as much as 30 minutes at a time .   Does not eat much salt on her diet  Occasionally salty snacks   Past Medical History:  Diagnosis Date  . Chicken pox 1967  . Diabetes mellitus without complication ([place])   . Thyroid disease     Past Surgical History:  Procedure Laterality Date  . ABDOMINAL HYSTERECTOMY  05/2010  . APPENDECTOMY  1984  . CHOLECYSTECTOMY  2011     Current Outpatient Prescriptions  Medication Sig Dispense Refill  . amLODipine (NORVASC) 5 MG tablet TAKE ONE TABLET BY MOUTH DAILY 30 tablet 11  . aspirin 81 MG chewable tablet Chew 1 tablet (81 mg total) by mouth daily. 30 tablet 3  . blood glucose meter kit and supplies KIT Dispense based on patient and insurance preference. Use up to four times daily as directed. (FOR ICD-9 250.00, 250.01). 1 each 0  . diltiazem (CARDIZEM) 30 MG tablet TAKE ONE TABLET BY MOUTH TWICE A DAY 60 tablet 11  . Empagliflozin-Metformin HCl (SYNJARDY) 05-998 MG TABS Take 1 tablet by mouth 2 (two) times daily. 60 tablet 11  . insulin glargine (LANTUS) 100 UNIT/ML injection Inject 30 Units into the skin at bedtime.    . Insulin Pen Needle (B-D ULTRAFINE III SHORT PEN) 31G X 8 MM MISC Use to administer insulin at bedtime  w/Solostar Pen. Dx E11.9 30 each 11  . levothyroxine (SYNTHROID, LEVOTHROID) 50 MCG tablet TAKE ONE TABLET BY MOUTH DAILY 30 tablet 11  . omeprazole (PRILOSEC) 20 MG capsule TAKE ONE CAPSULE BY MOUTH DAILY 30 capsule 2  . simvastatin (ZOCOR) 20 MG tablet TAKE ONE TABLET BY MOUTH DAILY AT 6:00PM 30 tablet 11   No current facility-administered medications for this visit.     PAD Screen 05/23/2016  Previous PAD dx? No  Previous surgical procedure? No  Pain with walking? No  Feet/toe relief with dangling? No  Painful, non-healing ulcers? No  Extremities discolored? No      Allergies:   Penicillins and Sulfa antibiotics    Social History:  The patient  reports that she has never smoked. She has never used smokeless tobacco. She reports that she does not drink alcohol or use drugs.   Family History:  The patient's family history includes Diabetes in her father; Heart disease (age of onset: 38) in her father; Hyperlipidemia in her father; Hypertension in her father; Stroke in her father.    ROS:  Please see the history of present illness.    Review of Systems: Constitutional:  denies fever, chills, diaphoresis, appetite change and fatigue.  HEENT: denies photophobia, eye pain, redness, hearing loss, ear pain, congestion, sore throat, rhinorrhea, sneezing, neck pain, neck stiffness  and tinnitus.  Respiratory: denies SOB, DOE, cough, chest tightness, and wheezing.  Cardiovascular: denies chest pain, palpitations and leg swelling.  Gastrointestinal: denies nausea, vomiting, abdominal pain, diarrhea, constipation, blood in stool.  Genitourinary: denies dysuria, urgency, frequency, hematuria, flank pain and difficulty urinating.  Musculoskeletal: denies  myalgias, back pain, joint swelling, arthralgias and gait problem.   Skin: denies pallor, rash and wound.  Neurological: denies dizziness, seizures, syncope, weakness, light-headedness, numbness and headaches.   Hematological: denies  adenopathy, easy bruising, personal or family bleeding history.  Psychiatric/ Behavioral: denies suicidal ideation, mood changes, confusion, nervousness, sleep disturbance and agitation.       All other systems are reviewed and negative.    PHYSICAL EXAM: VS:  BP 128/74 (BP Location: Left Arm, Patient Position: Sitting, Cuff Size: Normal)   Pulse 77   Ht 5' 2"  (1.575 m)   Wt 145 lb 3.2 oz (65.9 kg)   SpO2 99%   BMI 26.56 kg/m  , BMI Body mass index is 26.56 kg/m. GEN: Well nourished, well developed, in no acute distress  HEENT: normal  Neck: no JVD, carotid bruits, or masses Cardiac: RRR; no murmurs, rubs, or gallops,no edema  Breast:   She has a 3x3 mass in her left breast  Respiratory:  clear to auscultation bilaterally, normal work of breathing GI: soft, nontender, nondistended, + BS MS: no deformity or atrophy  Skin: warm and dry, no rash Neuro:  Strength and sensation are intact Psych: normal   EKG:  EKG is not ordered today. The ekg ordered 12/2015 demonstrates NSR with TWI ( she was off her meds at that time )    Recent Labs: 12/30/2015: ALT 24; BUN 15; Creatinine, Ser 1.02; Potassium 4.0; Sodium 136; TSH 1.88    Lipid Panel No results found for: CHOL, TRIG, HDL, CHOLHDL, VLDL, LDLCALC, LDLDIRECT    Wt Readings from Last 3 Encounters:  05/23/16 145 lb 3.2 oz (65.9 kg)  04/27/16 149 lb (67.6 kg)  03/02/16 152 lb (68.9 kg)      Other studies Reviewed: Additional studies/ records that were reviewed today include: . Review of the above records demonstrates:    ASSESSMENT AND PLAN:  1.  Chest pain :   Very atypical .   Has not recurred. She walks for up to 30% a without any episodes of chest discomfort.  She does have T-wave inversions on her EKG from December but she does not want a stress test at this time.  These ECG changes could have been due to repol changes from her poorly controlled BP at that tome.   Discussed this with her sister and they do not  want to order any expensive test at this time.  Specifically , they do not want a stress test at this time.   Her symptoms do not sound like angina .  Encouraged her to ambulate and call if she has any problems.    2.  HTN:   She was on 2 CCB - Dilt and AMlodipine. She has DM and would benefit from being on an ARB. Will DC amlodipine and start Losartan 50 mg a day   3. Breast mass:   She has a palpable left breast mass that I found incidentally when listening to her heart. It is not tender.   Had mamograms in 2015 by her report.  I 've encouraged her to get this checked out.   Current medicines are reviewed at length with the patient today.  The patient does not  have concerns regarding medicines.  Labs/ tests ordered today include:  No orders of the defined types were placed in this encounter.   Disposition:   FU with me in 6 months     Mertie Moores, MD  05/23/2016 3:48 PM    [address], [place], [place]  [address] Phone: [phone]; Fax: [phone]

## 2016-05-25 ENCOUNTER — Telehealth: Payer: Self-pay | Admitting: Internal Medicine

## 2016-05-25 NOTE — Telephone Encounter (Signed)
Pt insurance will not cover insulin glargine (LANTUS) 100 UNIT/ML injection   They will cover Basaglar, Trefiba, LezCarlean Purlemir  [employer] [facility] [place]

## 2016-05-26 NOTE — Telephone Encounter (Signed)
Pt.notified

## 2016-05-26 NOTE — Telephone Encounter (Signed)
Barbara White was already sent to pharmacy will contact patient to let them know pharmacy hads been trying to reach them to pick up RX

## 2016-06-13 ENCOUNTER — Other Ambulatory Visit: Payer: Medicare Other

## 2016-06-13 DIAGNOSIS — I10 Essential (primary) hypertension: Secondary | ICD-10-CM | POA: Diagnosis not present

## 2016-06-13 DIAGNOSIS — R0789 Other chest pain: Secondary | ICD-10-CM | POA: Diagnosis not present

## 2016-06-14 ENCOUNTER — Telehealth: Payer: Self-pay | Admitting: Cardiovascular Disease

## 2016-06-14 LAB — BASIC METABOLIC PANEL
BUN/Creatinine Ratio: 15 (ref 12–28)
BUN: 19 mg/dL (ref 8–27)
CO2: 24 mmol/L (ref 18–29)
CREATININE: 1.23 mg/dL — AB (ref 0.57–1.00)
Calcium: 9.7 mg/dL (ref 8.7–10.3)
Chloride: 99 mmol/L (ref 96–106)
GFR, EST AFRICAN AMERICAN: 53 mL/min/{1.73_m2} — AB (ref >59)
GFR, EST NON AFRICAN AMERICAN: 46 mL/min/{1.73_m2} — AB (ref >59)
Glucose: 148 mg/dL — ABNORMAL HIGH (ref 65–99)
Potassium: 4.5 mmol/L (ref 3.5–5.2)
Sodium: 138 mmol/L (ref 134–144)

## 2016-06-14 NOTE — Telephone Encounter (Signed)
Reviewed lab results with patient's sister (DPR) who verbalized understanding and thanked me for the call.

## 2016-06-14 NOTE — Telephone Encounter (Signed)
New message    Pt is calling stating she is returning call to Nelsonia[facility]le.

## 2016-07-03 ENCOUNTER — Other Ambulatory Visit: Payer: Self-pay | Admitting: Nurse Practitioner

## 2016-07-03 DIAGNOSIS — K219 Gastro-esophageal reflux disease without esophagitis: Secondary | ICD-10-CM

## 2016-07-04 NOTE — Telephone Encounter (Signed)
Routing to PCP

## 2016-07-07 ENCOUNTER — Other Ambulatory Visit: Payer: Self-pay

## 2016-07-07 MED ORDER — INSULIN GLARGINE 100 UNIT/ML ~~LOC~~ SOLN: 30.0000 [IU] | Freq: Every day | SUBCUTANEOUS | 3 refills | Status: DC

## 2016-07-07 MED ORDER — EMPAGLIFLOZIN-METFORMIN HCL 5-1000 MG PO TABS: 1.0000 | ORAL_TABLET | Freq: Two times a day (BID) | ORAL | 3 refills | Status: DC

## 2016-07-27 ENCOUNTER — Ambulatory Visit: Payer: Medicare Other | Admitting: Internal Medicine

## 2016-07-27 ENCOUNTER — Ambulatory Visit: Payer: Medicare Other | Admitting: Nurse Practitioner

## 2016-09-01 ENCOUNTER — Ambulatory Visit (INDEPENDENT_AMBULATORY_CARE_PROVIDER_SITE_OTHER): Payer: Medicare Other | Admitting: Internal Medicine

## 2016-09-01 ENCOUNTER — Encounter: Payer: Self-pay | Admitting: Internal Medicine

## 2016-09-01 DIAGNOSIS — Z794 Long term (current) use of insulin: Secondary | ICD-10-CM

## 2016-09-01 DIAGNOSIS — I10 Essential (primary) hypertension: Secondary | ICD-10-CM | POA: Diagnosis not present

## 2016-09-01 DIAGNOSIS — E034 Atrophy of thyroid (acquired): Secondary | ICD-10-CM | POA: Diagnosis not present

## 2016-09-01 DIAGNOSIS — E118 Type 2 diabetes mellitus with unspecified complications: Secondary | ICD-10-CM

## 2016-09-01 LAB — POCT GLYCOSYLATED HEMOGLOBIN (HGB A1C): Hemoglobin A1C: 9.3

## 2016-09-01 MED ORDER — AZITHROMYCIN 250 MG PO TABS: ORAL_TABLET | ORAL | 0 refills | Status: DC

## 2016-09-01 NOTE — Assessment & Plan Note (Signed)
Diltiazem

## 2016-09-01 NOTE — Patient Instructions (Signed)
Patellofemoral Pain Syndrome Patellofemoral pain syndrome is a condition that involves a softening or breakdown of the tissue (cartilage) on the underside of your kneecap (patella). This causes pain in the front of the knee. The condition is also called runner's knee or chondromalacia patella. Patellofemoral pain syndrome is most common in young adults who are active in sports. Your knee is the largest joint in your body. The patella covers the front of your knee and is attached to muscles above and below your knee. The underside of the patella is covered with a smooth type of cartilage (synovium). The smooth surface helps the patella glide easily when you move your knee. Patellofemoral pain syndrome causes swelling in the joint linings and bone surfaces in your knee. What are the causes? Patellofemoral pain syndrome can be caused by:  Overuse.  Poor alignment of your knee joints.  Weak leg muscles.  A direct blow to your kneecap.  What increases the risk? You may be at risk for patellofemoral pain syndrome if you:  Do a lot of activities that can wear down your kneecap. These include: ? Running. ? Squatting. ? Climbing stairs.  Start a new physical activity or exercise program.  Wear shoes that do not fit well.  Do not have good leg strength.  Are overweight.  What are the signs or symptoms? Knee pain is the most common symptom of patellofemoral pain syndrome. This may feel like a dull, aching pain underneath your patella, in the front of your knee. There may be a popping or cracking sound when you move your knee. Pain may get worse with:  Exercise.  Climbing stairs.  Running.  Jumping.  Squatting.  Kneeling.  Sitting for a long time.  Moving or pushing on your patella.  How is this diagnosed? Your health care provider may be able to diagnose patellofemoral pain syndrome from your symptoms and medical history. You may be asked about your recent physical activities  and which ones cause knee pain. Your health care provider may do a physical exam with certain tests to confirm the diagnosis. These may include:  Moving your patella back and forth.  Checking your range of knee motion.  Having you squat or jump to see if you have pain.  Checking the strength of your leg muscles.  An MRI of the knee may also be done. How is this treated? Patellofemoral pain syndrome can usually be treated at home with rest, ice, compression, and elevation (RICE). Other treatments may include:  Nonsteroidal anti-inflammatory drugs (NSAIDs).  Physical therapy to stretch and strengthen your leg muscles.  Shoe inserts (orthotics) to take stress off your knee.  A knee brace or knee support.  Surgery to remove damaged cartilage or move the patella to a better position. The need for surgery is rare.  Follow these instructions at home:  Take medicines only as directed by your health care provider.  Rest your knee. ? When resting, keep your knee raised above the level of your heart. ? Avoid activities that cause knee pain.  Apply ice to the injured area: ? Put ice in a plastic bag. ? Place a towel between your skin and the bag. ? Leave the ice on for 20 minutes, 2-3 times a day.  Use splints, braces, knee supports, or walking aids as directed by your health care provider.  Perform stretching and strengthening exercises as directed by your health care provider or physical therapist.  Keep all follow-up visits as directed by your health care   provider. This is important. Contact a health care provider if:  Your symptoms get worse.  You are not improving with home care. This information is not intended to replace advice given to you by your health care provider. Make sure you discuss any questions you have with your health care provider. Document Released: 12/28/2008 Document Revised: 06/17/2015 Document Reviewed: 03/31/2013 Elsevier Interactive Patient Education   2018 [employer]  

## 2016-09-01 NOTE — Assessment & Plan Note (Addendum)
A1c A little better

## 2016-09-01 NOTE — Assessment & Plan Note (Signed)
On levothroid 

## 2016-09-01 NOTE — Progress Notes (Signed)
Subjective:  Patient ID: Barbara White, female    DOB: 12-Mar-1950  Age: 66 y.o. MRN: [id number]  CC: No chief complaint on file.   HPI Barbara White presents for URI on the return from [place]) F/u HTN, DM, dyslipidemia C/o toothache  Outpatient Medications Prior to Visit  Medication Sig Dispense Refill  . aspirin 81 MG chewable tablet Chew 1 tablet (81 mg total) by mouth daily. 30 tablet 3  . blood glucose meter kit and supplies KIT Dispense based on patient and insurance preference. Use up to four times daily as directed. (FOR ICD-9 250.00, 250.01). 1 each 0  . diltiazem (CARDIZEM) 30 MG tablet TAKE ONE TABLET BY MOUTH TWICE A DAY 60 tablet 11  . Empagliflozin-Metformin HCl (SYNJARDY) 05-998 MG TABS Take 1 tablet by mouth 2 (two) times daily. 180 tablet 3  . insulin glargine (LANTUS) 100 UNIT/ML injection Inject 0.3 mLs (30 Units total) into the skin at bedtime. DX: E11.9 40 mL 3  . Insulin Pen Needle (B-D ULTRAFINE III SHORT PEN) 31G X 8 MM MISC Use to administer insulin at bedtime w/Solostar Pen. Dx E11.9 30 each 11  . levothyroxine (SYNTHROID, LEVOTHROID) 50 MCG tablet TAKE ONE TABLET BY MOUTH DAILY 30 tablet 11  . omeprazole (PRILOSEC) 20 MG capsule TAKE ONE CAPSULE BY MOUTH DAILY 90 capsule 1  . simvastatin (ZOCOR) 20 MG tablet TAKE ONE TABLET BY MOUTH DAILY AT 6:00PM 30 tablet 11  . losartan (COZAAR) 50 MG tablet Take 1 tablet (50 mg total) by mouth daily. 90 tablet 3   No facility-administered medications prior to visit.     ROS Review of Systems  Constitutional: Negative for activity change, appetite change, chills, fatigue and unexpected weight change.  HENT: Negative for congestion, mouth sores and sinus pressure.   Eyes: Negative for visual disturbance.  Respiratory: Positive for cough. Negative for chest tightness.   Gastrointestinal: Negative for abdominal pain and nausea.  Genitourinary: Negative for difficulty urinating, frequency and vaginal pain.    Musculoskeletal: Negative for back pain and gait problem.  Skin: Negative for pallor and rash.  Neurological: Negative for dizziness, tremors, weakness, numbness and headaches.  Psychiatric/Behavioral: Negative for confusion and sleep disturbance.    Objective:  BP 120/64 (BP Location: Left Arm, Patient Position: Sitting, Cuff Size: Normal)   Pulse 81   Temp 98.2 F (36.8 C) (Oral)   Ht _0  (1.575 m)   Wt 143 lb (64.9 kg)   SpO2 96%   BMI 26.16 kg/m   BP Readings from Last 3 Encounters:  09/01/16 120/64  05/23/16 128/74  04/27/16 132/78    Wt Readings from Last 3 Encounters:  09/01/16 143 lb (64.9 kg)  05/23/16 145 lb 3.2 oz (65.9 kg)  04/27/16 149 lb (67.6 kg)    Physical Exam  Constitutional: She appears well-developed. No distress.  HENT:  Head: Normocephalic.  Right Ear: External ear normal.  Left Ear: External ear normal.  Nose: Nose normal.  Mouth/Throat: Oropharynx is clear and moist.  Eyes: Pupils are equal, round, and reactive to light. Conjunctivae are normal. Right eye exhibits no discharge. Left eye exhibits no discharge.  Neck: Normal range of motion. Neck supple. No JVD present. No tracheal deviation present. No thyromegaly present.  Cardiovascular: Normal rate, regular rhythm and normal heart sounds.   Pulmonary/Chest: No stridor. No respiratory distress. She has no wheezes.  Abdominal: Soft. Bowel sounds are normal. She exhibits no distension and no mass. There is no tenderness. There is no rebound  and no guarding.  Musculoskeletal: She exhibits tenderness. She exhibits no edema.  Lymphadenopathy:    She has no cervical adenopathy.  Neurological: She displays normal reflexes. No cranial nerve deficit. She exhibits normal muscle tone. Coordination normal.  Skin: No rash noted. No erythema.  Psychiatric: She has a normal mood and affect. Her behavior is normal. Judgment and thought content normal.  tooth abscess R incisor B patella - tender  Lab  Results  Component Value Date   GLUCOSE 148 (H) 06/13/2016   ALT 24 12/30/2015   AST 25 12/30/2015   NA 138 06/13/2016   K 4.5 06/13/2016   CL 99 06/13/2016   CREATININE 1.23 (H) 06/13/2016   BUN 19 06/13/2016   CO2 24 06/13/2016   TSH 1.88 12/30/2015   HGBA1C 9.8 (H) 12/30/2015    A1c 9.3  Ct Abdomen Pelvis W Contrast  Result Date: 01/13/2016 CLINICAL DATA:  Right lower quadrant pain EXAM: CT ABDOMEN AND PELVIS WITH CONTRAST TECHNIQUE: Multidetector CT imaging of the abdomen and pelvis was performed using the standard protocol following bolus administration of intravenous contrast. CONTRAST:  100 mL Isovue-300. COMPARISON:  None. FINDINGS: Lower chest: No acute abnormality. Hepatobiliary: The liver is diffusely fatty infiltrated. The gallbladder has been surgically removed. Pancreas: Unremarkable. No pancreatic ductal dilatation or surrounding inflammatory changes. Spleen: Normal in size without focal abnormality. Adrenals/Urinary Tract: The adrenal glands are within normal limits. A few scattered small cysts are seen. No calculi or obstructive changes are noted. The bladder is partially distended. Stomach/Bowel: Scattered diverticular change of the colon is noted without diverticulitis. The appendix is not visualized consistent with a prior surgical history. No obstructive changes are seen. Vascular/Lymphatic: Aortic atherosclerosis. No enlarged abdominal or pelvic lymph nodes. Reproductive: Status post hysterectomy. No adnexal masses. Other: No abdominal wall hernia or abnormality. No abdominopelvic ascites. Musculoskeletal: No acute or significant osseous findings. IMPRESSION: Chronic changes as described above without acute abnormality. Postsurgical changes. Electronically Signed   By: Inez Catalina M.D.   On: 01/13/2016 09:55    Assessment & Plan:   There are no diagnoses linked to this encounter. I am having Ms. Simi maintain her aspirin, Insulin Pen Needle, blood glucose meter kit  and supplies, simvastatin, levothyroxine, diltiazem, losartan, omeprazole, insulin glargine, and Empagliflozin-Metformin HCl.  No orders of the defined types were placed in this encounter.    Follow-up: No Follow-up on file.  Walker Kehr, MD

## 2016-12-05 ENCOUNTER — Ambulatory Visit (INDEPENDENT_AMBULATORY_CARE_PROVIDER_SITE_OTHER): Payer: Medicare Other | Admitting: Internal Medicine

## 2016-12-05 ENCOUNTER — Other Ambulatory Visit (INDEPENDENT_AMBULATORY_CARE_PROVIDER_SITE_OTHER): Payer: Medicare Other

## 2016-12-05 ENCOUNTER — Encounter: Payer: Self-pay | Admitting: Internal Medicine

## 2016-12-05 VITALS — BP 104/62 | HR 70 | Temp 97.6°F | Resp 16 | Ht 62.0 in | Wt 140.0 lb

## 2016-12-05 DIAGNOSIS — Z23 Encounter for immunization: Secondary | ICD-10-CM

## 2016-12-05 DIAGNOSIS — Z794 Long term (current) use of insulin: Secondary | ICD-10-CM

## 2016-12-05 DIAGNOSIS — E034 Atrophy of thyroid (acquired): Secondary | ICD-10-CM

## 2016-12-05 DIAGNOSIS — E118 Type 2 diabetes mellitus with unspecified complications: Secondary | ICD-10-CM | POA: Diagnosis not present

## 2016-12-05 DIAGNOSIS — E785 Hyperlipidemia, unspecified: Secondary | ICD-10-CM | POA: Diagnosis not present

## 2016-12-05 DIAGNOSIS — I10 Essential (primary) hypertension: Secondary | ICD-10-CM

## 2016-12-05 LAB — BASIC METABOLIC PANEL
BUN: 16 mg/dL (ref 6–23)
CO2: 27 mEq/L (ref 19–32)
Calcium: 9.4 mg/dL (ref 8.4–10.5)
Chloride: 101 mEq/L (ref 96–112)
Creatinine, Ser: 1.02 mg/dL (ref 0.40–1.20)
GFR: 57.56 mL/min — AB (ref >60.00)
Glucose, Bld: 152 mg/dL — ABNORMAL HIGH (ref 70–99)
POTASSIUM: 4.4 meq/L (ref 3.5–5.1)
Sodium: 136 mEq/L (ref 135–145)

## 2016-12-05 LAB — HEMOGLOBIN A1C: HEMOGLOBIN A1C: 9.4 % — AB (ref 4.6–6.5)

## 2016-12-05 LAB — MICROALBUMIN / CREATININE URINE RATIO
Creatinine,U: 42.4 mg/dL
MICROALB/CREAT RATIO: 1.7 mg/g (ref 0.0–30.0)
Microalb, Ur: <0.7 mg/dL (ref 0.0–1.9)

## 2016-12-05 LAB — T4, FREE: FREE T4: 0.98 ng/dL (ref 0.60–1.60)

## 2016-12-05 LAB — TSH: TSH: 1.5 u[IU]/mL (ref 0.35–4.50)

## 2016-12-05 MED ORDER — INSULIN DETEMIR 100 UNIT/ML FLEXPEN: 30.0000 [IU] | PEN_INJECTOR | Freq: Every day | SUBCUTANEOUS | 3 refills | Status: DC

## 2016-12-05 MED ORDER — SIMVASTATIN 20 MG PO TABS: ORAL_TABLET | ORAL | 3 refills | Status: DC

## 2016-12-05 MED ORDER — VITAMIN D3 50 MCG (2000 UT) PO CAPS: 2000.0000 [IU] | ORAL_CAPSULE | Freq: Every day | ORAL | 3 refills | Status: DC

## 2016-12-05 NOTE — Assessment & Plan Note (Signed)
Labs

## 2016-12-05 NOTE — Assessment & Plan Note (Signed)
Hold simvastatin x2-3 weeks if pains

## 2016-12-05 NOTE — Addendum Note (Signed)
Addended by: Mercer PodWRENN,  E on: 12/05/2016 04:26 PM   Modules accepted: Orders

## 2016-12-05 NOTE — Progress Notes (Signed)
Subjective:  Patient ID: Barbara White, female    DOB: [birth year]  Age: 66 y.o. MRN: [id number]  CC: No chief complaint on file.   HPI Barbara White presents for DM, HTN, dyslipidemia f/u  Outpatient Medications Prior to Visit  Medication Sig Dispense Refill  . aspirin 81 MG chewable tablet Chew 1 tablet (81 mg total) by mouth daily. 30 tablet 3  . azithromycin (ZITHROMAX Z-PAK) 250 MG tablet As directed 6 each 0  . blood glucose meter kit and supplies KIT Dispense based on patient and insurance preference. Use up to four times daily as directed. (FOR ICD-9 250.00, 250.01). 1 each 0  . diltiazem (CARDIZEM) 30 MG tablet TAKE ONE TABLET BY MOUTH TWICE A DAY 60 tablet 11  . Empagliflozin-Metformin HCl (SYNJARDY) 05-998 MG TABS Take 1 tablet by mouth 2 (two) times daily. 180 tablet 3  . insulin glargine (LANTUS) 100 UNIT/ML injection Inject 0.3 mLs (30 Units total) into the skin at bedtime. DX: E11.9 40 mL 3  . Insulin Pen Needle (B-D ULTRAFINE III SHORT PEN) 31G X 8 MM MISC Use to administer insulin at bedtime w/Solostar Pen. Dx E11.9 30 each 11  . levothyroxine (SYNTHROID, LEVOTHROID) 50 MCG tablet TAKE ONE TABLET BY MOUTH DAILY 30 tablet 11  . losartan (COZAAR) 50 MG tablet Take 1 tablet (50 mg total) by mouth daily. 90 tablet 3  . omeprazole (PRILOSEC) 20 MG capsule TAKE ONE CAPSULE BY MOUTH DAILY 90 capsule 1  . simvastatin (ZOCOR) 20 MG tablet TAKE ONE TABLET BY MOUTH DAILY AT 6:00PM 30 tablet 11   No facility-administered medications prior to visit.     ROS Review of Systems  Constitutional: Negative for activity change, appetite change, chills, fatigue and unexpected weight change.  HENT: Negative for congestion, mouth sores and sinus pressure.   Eyes: Negative for visual disturbance.  Respiratory: Negative for cough and chest tightness.   Gastrointestinal: Negative for abdominal pain and nausea.  Genitourinary: Negative for difficulty urinating, frequency and vaginal pain.    Musculoskeletal: Negative for back pain and gait problem.  Skin: Negative for pallor and rash.  Neurological: Negative for dizziness, tremors, weakness, numbness and headaches.  Psychiatric/Behavioral: Negative for confusion and sleep disturbance.    Objective:  BP 104/62 (BP Location: Left Arm, Patient Position: Sitting, Cuff Size: Normal)   Pulse 70   Temp 97.6 F (36.4 C) (Oral)   Resp 16   Ht 5' 2"  (1.575 m)   Wt 140 lb (63.5 kg)   SpO2 96%   BMI 25.61 kg/m   BP Readings from Last 3 Encounters:  12/05/16 104/62  09/01/16 120/64  05/23/16 128/74    Wt Readings from Last 3 Encounters:  12/05/16 140 lb (63.5 kg)  09/01/16 143 lb (64.9 kg)  05/23/16 145 lb 3.2 oz (65.9 kg)    Physical Exam  Constitutional: She appears well-developed. No distress.  HENT:  Head: Normocephalic.  Right Ear: External ear normal.  Left Ear: External ear normal.  Nose: Nose normal.  Mouth/Throat: Oropharynx is clear and moist.  Eyes: Conjunctivae are normal. Pupils are equal, round, and reactive to light. Right eye exhibits no discharge. Left eye exhibits no discharge.  Neck: Normal range of motion. Neck supple. No JVD present. No tracheal deviation present. No thyromegaly present.  Cardiovascular: Normal rate, regular rhythm and normal heart sounds.  Pulmonary/Chest: No stridor. No respiratory distress. She has no wheezes.  Abdominal: Soft. Bowel sounds are normal. She exhibits no distension and no mass. There  is no tenderness. There is no rebound and no guarding.  Musculoskeletal: She exhibits no edema or tenderness.  Lymphadenopathy:    She has no cervical adenopathy.  Neurological: She displays normal reflexes. No cranial nerve deficit. She exhibits normal muscle tone. Coordination normal.  Skin: No rash noted. No erythema.  Psychiatric: She has a normal mood and affect. Her behavior is normal. Judgment and thought content normal.    Lab Results  Component Value Date   GLUCOSE 148  (H) 06/13/2016   ALT 24 12/30/2015   AST 25 12/30/2015   NA 138 06/13/2016   K 4.5 06/13/2016   CL 99 06/13/2016   CREATININE 1.23 (H) 06/13/2016   BUN 19 06/13/2016   CO2 24 06/13/2016   TSH 1.88 12/30/2015   HGBA1C 9.3 09/01/2016    Ct Abdomen Pelvis W Contrast  Result Date: 01/13/2016 CLINICAL DATA:  Right lower quadrant pain EXAM: CT ABDOMEN AND PELVIS WITH CONTRAST TECHNIQUE: Multidetector CT imaging of the abdomen and pelvis was performed using the standard protocol following bolus administration of intravenous contrast. CONTRAST:  100 mL Isovue-300. COMPARISON:  None. FINDINGS: Lower chest: No acute abnormality. Hepatobiliary: The liver is diffusely fatty infiltrated. The gallbladder has been surgically removed. Pancreas: Unremarkable. No pancreatic ductal dilatation or surrounding inflammatory changes. Spleen: Normal in size without focal abnormality. Adrenals/Urinary Tract: The adrenal glands are within normal limits. A few scattered small cysts are seen. No calculi or obstructive changes are noted. The bladder is partially distended. Stomach/Bowel: Scattered diverticular change of the colon is noted without diverticulitis. The appendix is not visualized consistent with a prior surgical history. No obstructive changes are seen. Vascular/Lymphatic: Aortic atherosclerosis. No enlarged abdominal or pelvic lymph nodes. Reproductive: Status post hysterectomy. No adnexal masses. Other: No abdominal wall hernia or abnormality. No abdominopelvic ascites. Musculoskeletal: No acute or significant osseous findings. IMPRESSION: Chronic changes as described above without acute abnormality. Postsurgical changes. Electronically Signed   By: Inez Catalina M.D.   On: 01/13/2016 09:55    Assessment & Plan:   There are no diagnoses linked to this encounter. I am having Barbara White maintain her aspirin, Insulin Pen Needle, blood glucose meter kit and supplies, simvastatin, levothyroxine, diltiazem,  losartan, omeprazole, insulin glargine, Empagliflozin-Metformin HCl, and azithromycin.  No orders of the defined types were placed in this encounter.    Follow-up: No Follow-up on file.  Walker Kehr, MD

## 2016-12-05 NOTE — Assessment & Plan Note (Signed)
Levemir Synjardy   Poor control Endo ref Dr Everardo AllEllison

## 2017-01-15 ENCOUNTER — Ambulatory Visit (INDEPENDENT_AMBULATORY_CARE_PROVIDER_SITE_OTHER): Payer: Medicare Other | Admitting: Endocrinology

## 2017-01-15 ENCOUNTER — Encounter: Payer: Self-pay | Admitting: Endocrinology

## 2017-01-15 VITALS — BP 106/62 | HR 78 | Wt 141.2 lb

## 2017-01-15 DIAGNOSIS — E1142 Type 2 diabetes mellitus with diabetic polyneuropathy: Secondary | ICD-10-CM

## 2017-01-15 DIAGNOSIS — Z794 Long term (current) use of insulin: Secondary | ICD-10-CM | POA: Diagnosis not present

## 2017-01-15 MED ORDER — INSULIN DETEMIR 100 UNIT/ML FLEXPEN: 20.0000 [IU] | PEN_INJECTOR | Freq: Two times a day (BID) | SUBCUTANEOUS | 3 refills | Status: DC

## 2017-01-15 NOTE — Progress Notes (Signed)
Subjective:    Patient ID: Barbara White, female    DOB: Aug 17, 1950, 66 y.o.   MRN: [id number]  HPI pt is referred by Dr Alain Marion, for diabetes.  Pt states DM was dx'ed in 1998 (she had GDM in 1976); she has mild neuropathy of the lower extremities; she is unaware of any associated chronic complications; she has been on insulin since 2003; pt says her diet and exercise are good; she has never had pancreatitis, pancreatic surgery, severe hypoglycemia or DKA.   She takes levemir, 5 units qam and 30 units qpm.  She also takes [place].  She says cbg's vary from 120-200's.  It is in general higher as the day goes on.   Past Medical History:  Diagnosis Date  . Chicken pox 1967  . Diabetes mellitus without complication ([place])   . Thyroid disease     Past Surgical History:  Procedure Laterality Date  . ABDOMINAL HYSTERECTOMY  05/2010  . APPENDECTOMY  1984  . CHOLECYSTECTOMY  2011    Social History   Socioeconomic History  . Marital status: Widowed    Spouse name: Not on file  . Number of children: Not on file  . Years of education: Not on file  . Highest education level: Not on file  Social Needs  . Financial resource strain: Not on file  . Food insecurity - worry: Not on file  . Food insecurity - inability: Not on file  . Transportation needs - medical: Not on file  . Transportation needs - non-medical: Not on file  Occupational History  . Not on file  Tobacco Use  . Smoking status: Never Smoker  . Smokeless tobacco: Never Used  Substance and Sexual Activity  . Alcohol use: No  . Drug use: No  . Sexual activity: Not Currently  Other Topics Concern  . Not on file  Social History Narrative  . Not on file    Current Outpatient Medications on File Prior to Visit  Medication Sig Dispense Refill  . aspirin 81 MG chewable tablet Chew 1 tablet (81 mg total) by mouth daily. 30 tablet 3  . blood glucose meter kit and supplies KIT Dispense based on patient and insurance  preference. Use up to four times daily as directed. (FOR ICD-9 250.00, 250.01). 1 each 0  . Cholecalciferol (VITAMIN D3) 2000 units capsule Take 1 capsule (2,000 Units total) daily by mouth. 100 capsule 3  . diltiazem (CARDIZEM) 30 MG tablet TAKE ONE TABLET BY MOUTH TWICE A DAY 60 tablet 11  . Empagliflozin-Metformin HCl (SYNJARDY) 05-998 MG TABS Take 1 tablet by mouth 2 (two) times daily. 180 tablet 3  . Insulin Pen Needle (B-D ULTRAFINE III SHORT PEN) 31G X 8 MM MISC Use to administer insulin at bedtime w/Solostar Pen. Dx E11.9 30 each 11  . levothyroxine (SYNTHROID, LEVOTHROID) 50 MCG tablet TAKE ONE TABLET BY MOUTH DAILY 30 tablet 11  . omeprazole (PRILOSEC) 20 MG capsule TAKE ONE CAPSULE BY MOUTH DAILY 90 capsule 1  . simvastatin (ZOCOR) 20 MG tablet TAKE ONE TABLET BY MOUTH DAILY AT 6:00PM 90 tablet 3  . losartan (COZAAR) 50 MG tablet Take 1 tablet (50 mg total) by mouth daily. 90 tablet 3   No current facility-administered medications on file prior to visit.     Allergies  Allergen Reactions  . Penicillins Swelling    Body swollen.   . Sulfa Antibiotics Itching    Family History  Problem Relation Age of Onset  . Stroke Father   .  Diabetes Father   . Heart disease Father 16       with MI  . Hypertension Father   . Hyperlipidemia Father     BP 106/62   Pulse 78   Wt 141 lb 3.2 oz (64 kg)   SpO2 96%   BMI 25.83 kg/m   Review of Systems denies blurry vision, chest pain, sob, vomiting, urinary frequency, muscle cramps, excessive diaphoresis, memory loss, depression, rhinorrhea, and easy bruising.  She has lost 11 lbs x a few months.  She has intermitt headache, cold intolerance, and nausea.     Objective:   Physical Exam VS: see vs page GEN: no distress HEAD: head: no deformity eyes: no periorbital swelling, no proptosis external nose and ears are normal mouth: no lesion seen NECK: supple, thyroid is not enlarged CHEST WALL: no deformity LUNGS: clear to  auscultation CV: reg rate and rhythm, no murmur ABD: abdomen is soft, nontender.  no hepatosplenomegaly.  not distended.  no hernia MUSCULOSKELETAL: muscle bulk and strength are grossly normal.  no obvious joint swelling.  gait is normal and steady.   EXTEMITIES: no deformity.  no ulcer on the feet.  feet are of normal color and temp.  no edema PULSES: dorsalis pedis intact bilat.  no carotid bruit NEURO:  cn 2-12 grossly intact.   readily moves all 4's.  sensation is intact to touch on the feet.   SKIN:  Normal texture and temperature.  No rash or suspicious lesion is visible.   NODES:  None palpable at the neck.  PSYCH: alert, well-oriented.  Does not appear anxious nor depressed.     Lab Results  Component Value Date   HGBA1C 9.4 (H) 12/05/2016   Lab Results  Component Value Date   CREATININE 1.02 12/05/2016   BUN 16 12/05/2016   NA 136 12/05/2016   K 4.4 12/05/2016   CL 101 12/05/2016   CO2 27 12/05/2016   Lab Results  Component Value Date   MICROALBUR <0.7 12/05/2016   I personally reviewed electrocardiogram tracing (12/30/15): Indication: chest pain Impression: NSR.  No MI.  No hypertrophy.  NS-TWA No comparison is available.     Assessment & Plan:  Insulin-requiring type 2 DM, with polyneuropathy: Based on the pattern of her cbg's, she needs some adjustment in her therapy.    Patient Instructions  good diet and exercise significantly improve the control of your diabetes.  please let me know if you wish to be referred to a dietician.  high blood sugar is very risky to your health.  you should see an eye doctor and dentist every year.  It is very important to get all recommended vaccinations.  Controlling your blood pressure and cholesterol drastically reduces the damage diabetes does to your body.  Those who smoke should quit.  Please discuss these with your doctor.  check your blood sugar twice a day.  vary the time of day when you check, between before the 3 meals,  and at bedtime.  also check if you have symptoms of your blood sugar being too high or too low.  please keep a record of the readings and bring it to your next appointment here (or you can bring the meter itself).  You can write it on any piece of paper.  please call us sooner if your blood sugar goes below 70, or if you have a lot of readings over 200.   For now, please:  Change levemir to 20 units twice a day,  and:  Please continue the same synjardy.  Please call or message [place] next week, to tell us how the blood sugar is doing.   We will need to take this complex situation in stages.   Please come back for a follow-up appointment in 1 month.

## 2017-01-15 NOTE — Patient Instructions (Addendum)
good diet and exercise significantly improve the control of your diabetes.  please let me know if you wish to be referred to a dietician.  high blood sugar is very risky to your health.  you should see an eye doctor and dentist every year.  It is very important to get all recommended vaccinations.  Controlling your blood pressure and cholesterol drastically reduces the damage diabetes does to your body.  Those who smoke should quit.  Please discuss these with your doctor.  check your blood sugar twice a day.  vary the time of day when you check, between before the 3 meals, and at bedtime.  also check if you have symptoms of your blood sugar being too high or too low.  please keep a record of the readings and bring it to your next appointment here (or you can bring the meter itself).  You can write it on any piece of paper.  please call us sooner if your blood sugar goes below 70, or if you have a lot of readings over 200.   For now, please:  Change levemir to 20 units twice a day, and:  Please continue the same synjardy.  Please call or message us next week, to tell us how the blood sugar is doing.   We will need to take this complex situation in stages.   Please come back for a follow-up appointment in 1 month.

## 2017-01-22 ENCOUNTER — Telehealth: Payer: Self-pay | Admitting: Endocrinology

## 2017-01-22 NOTE — Telephone Encounter (Signed)
Patient's sister called to update Dr Everardo AllEllison on how patient is doing. She is doing ok. Blood sugar was 245 last night. On 12/24 it was  116. Ranging from 116-245 Sister ph# [phone] if you have any questions

## 2017-01-24 NOTE — Telephone Encounter (Signed)
Please verify levemir is 20 units bid Then change to 30 units qam and 15 units qpm. I'll see you next time.

## 2017-01-24 NOTE — Telephone Encounter (Signed)
I tried to call number provided to give new insulin dosages but received no answer & VM was full

## 2017-01-25 NOTE — Telephone Encounter (Signed)
I called and verified dosages with patient's sister. I also gave he new dosages & she stated she would let us know in a week or so how blood sugars were doing.

## 2017-02-12 ENCOUNTER — Telehealth: Payer: Self-pay | Admitting: Internal Medicine

## 2017-02-12 DIAGNOSIS — N63 Unspecified lump in unspecified breast: Secondary | ICD-10-CM

## 2017-02-12 NOTE — Telephone Encounter (Signed)
Copied from CRM #[id number]. Topic: Referral - Request >> Feb 12, 2017  2:18 PM Eston Mouldavis, Cheri B wrote: Reason for CRM: PT is asking for a referral  for sonogram  for  lump on left breast [facility]  [phone]  Pt  already has apt  1/29  2:00pm

## 2017-02-13 NOTE — Telephone Encounter (Signed)
Ok Thx 

## 2017-02-14 NOTE — Telephone Encounter (Signed)
Called pt no answer LMOM referral has been placed../lmb 

## 2017-02-16 ENCOUNTER — Encounter: Payer: Self-pay | Admitting: Endocrinology

## 2017-02-16 ENCOUNTER — Ambulatory Visit (INDEPENDENT_AMBULATORY_CARE_PROVIDER_SITE_OTHER): Payer: Medicare Other | Admitting: Endocrinology

## 2017-02-16 VITALS — BP 111/66 | HR 79 | Wt 140.2 lb

## 2017-02-16 DIAGNOSIS — E1142 Type 2 diabetes mellitus with diabetic polyneuropathy: Secondary | ICD-10-CM

## 2017-02-16 DIAGNOSIS — Z794 Long term (current) use of insulin: Secondary | ICD-10-CM | POA: Diagnosis not present

## 2017-02-16 LAB — POCT GLYCOSYLATED HEMOGLOBIN (HGB A1C): Hemoglobin A1C: 8

## 2017-02-16 MED ORDER — INSULIN DETEMIR 100 UNIT/ML FLEXPEN: PEN_INJECTOR | SUBCUTANEOUS | 11 refills | Status: DC

## 2017-02-16 NOTE — Patient Instructions (Addendum)
check your blood sugar twice a day.  vary the time of day when you check, between before the 3 meals, and at bedtime.  also check if you have symptoms of your blood sugar being too high or too low.  please keep a record of the readings and bring it to your next appointment here (or you can bring the meter itself).  You can write it on any piece of paper.  please call us sooner if your blood sugar goes below 70, or if you have a lot of readings over 200.   For now, please:  Change levemir to 35 units each morning and 15 units each evening Please continue the same synjardy.  Please come back for a follow-up appointment in 2 months.

## 2017-02-16 NOTE — Progress Notes (Signed)
Subjective:    Patient ID: Barbara White, female    DOB: 1950-09-10, 67 y.o.   MRN: [id number]  HPI Pt returns for f/u of diabetes mellitus: DM type: Insulin-requiring type 2 Dx'ed: 1448 Complications: polyneuropathy Therapy: insulin since 2003, and 2 oral meds GDM: 1976 DKA: never Severe hypoglycemia: never Pancreatitis: never Pancreatic imaging: normal on 2017 CT Other: she takes BID insulin, at least for now  Interval history: She takes 25 units qam and 20 units qpm.  she brings a record of her cbg's which I have reviewed today.  It varies from 90-200's.  It is in general higher as the day goes on.   Past Medical History:  Diagnosis Date  . Chicken pox 1967  . Diabetes mellitus without complication ([place])   . Thyroid disease     Past Surgical History:  Procedure Laterality Date  . ABDOMINAL HYSTERECTOMY  05/2010  . APPENDECTOMY  1984  . CHOLECYSTECTOMY  2011    Social History   Socioeconomic History  . Marital status: Widowed    Spouse name: Not on file  . Number of children: Not on file  . Years of education: Not on file  . Highest education level: Not on file  Social Needs  . Financial resource strain: Not on file  . Food insecurity - worry: Not on file  . Food insecurity - inability: Not on file  . Transportation needs - medical: Not on file  . Transportation needs - non-medical: Not on file  Occupational History  . Not on file  Tobacco Use  . Smoking status: Never Smoker  . Smokeless tobacco: Never Used  Substance and Sexual Activity  . Alcohol use: No  . Drug use: No  . Sexual activity: Not Currently  Other Topics Concern  . Not on file  Social History Narrative  . Not on file    Current Outpatient Medications on File Prior to Visit  Medication Sig Dispense Refill  . aspirin 81 MG chewable tablet Chew 1 tablet (81 mg total) by mouth daily. 30 tablet 3  . blood glucose meter kit and supplies KIT Dispense based on patient and insurance preference.  Use up to four times daily as directed. (FOR ICD-9 250.00, 250.01). 1 each 0  . Cholecalciferol (VITAMIN D3) 2000 units capsule Take 1 capsule (2,000 Units total) daily by mouth. 100 capsule 3  . diltiazem (CARDIZEM) 30 MG tablet TAKE ONE TABLET BY MOUTH TWICE A DAY 60 tablet 11  . Empagliflozin-Metformin HCl (SYNJARDY) 05-998 MG TABS Take 1 tablet by mouth 2 (two) times daily. 180 tablet 3  . Insulin Pen Needle (B-D ULTRAFINE III SHORT PEN) 31G X 8 MM MISC Use to administer insulin at bedtime w/Solostar Pen. Dx E11.9 30 each 11  . levothyroxine (SYNTHROID, LEVOTHROID) 50 MCG tablet TAKE ONE TABLET BY MOUTH DAILY 30 tablet 11  . omeprazole (PRILOSEC) 20 MG capsule TAKE ONE CAPSULE BY MOUTH DAILY 90 capsule 1  . simvastatin (ZOCOR) 20 MG tablet TAKE ONE TABLET BY MOUTH DAILY AT 6:00PM 90 tablet 3  . losartan (COZAAR) 50 MG tablet Take 1 tablet (50 mg total) by mouth daily. 90 tablet 3   No current facility-administered medications on file prior to visit.     Allergies  Allergen Reactions  . Penicillins Swelling    Body swollen.   . Sulfa Antibiotics Itching    Family History  Problem Relation Age of Onset  . Stroke Father   . Diabetes Father   . Heart  disease Father 44       with MI  . Hypertension Father   . Hyperlipidemia Father     BP 111/66 (BP Location: Left Arm, Patient Position: Sitting, Cuff Size: Normal)   Pulse 79   Wt 140 lb 3.2 oz (63.6 kg)   SpO2 95%   BMI 25.64 kg/m   Review of Systems She denies hypoglycemia.     Objective:   Physical Exam VITAL SIGNS:  See vs page GENERAL: no distress Pulses: dorsalis pedis intact bilat.   MSK: no deformity of the feet CV: no leg edema Skin:  no ulcer on the feet.  normal color and temp on the feet.  Neuro: sensation is intact to touch on the feet  Lab Results  Component Value Date   CREATININE 1.02 12/05/2016   BUN 16 12/05/2016   NA 136 12/05/2016   K 4.4 12/05/2016   CL 101 12/05/2016   CO2 27 12/05/2016    Lab Results  Component Value Date   HGBA1C 8.0 02/16/2017       Assessment & Plan:  Insulin-requiring type 2 DM, with polyneuropathy.  Based on the pattern of her cbg's, she needs some adjustment in her therapy  Patient Instructions  check your blood sugar twice a day.  vary the time of day when you check, between before the 3 meals, and at bedtime.  also check if you have symptoms of your blood sugar being too high or too low.  please keep a record of the readings and bring it to your next appointment here (or you can bring the meter itself).  You can write it on any piece of paper.  please call us sooner if your blood sugar goes below 70, or if you have a lot of readings over 200.   For now, please:  Change levemir to 35 units each morning and 15 units each evening Please continue the same synjardy.  Please come back for a follow-up appointment in 2 months.

## 2017-02-20 DIAGNOSIS — N6322 Unspecified lump in the left breast, upper inner quadrant: Secondary | ICD-10-CM | POA: Diagnosis not present

## 2017-02-20 DIAGNOSIS — R922 Inconclusive mammogram: Secondary | ICD-10-CM | POA: Diagnosis not present

## 2017-02-20 DIAGNOSIS — N6321 Unspecified lump in the left breast, upper outer quadrant: Secondary | ICD-10-CM | POA: Diagnosis not present

## 2017-02-22 ENCOUNTER — Other Ambulatory Visit: Payer: Self-pay | Admitting: Radiology

## 2017-02-22 DIAGNOSIS — N6012 Diffuse cystic mastopathy of left breast: Secondary | ICD-10-CM | POA: Diagnosis not present

## 2017-02-22 DIAGNOSIS — N6022 Fibroadenosis of left breast: Secondary | ICD-10-CM | POA: Diagnosis not present

## 2017-02-22 DIAGNOSIS — N61 Mastitis without abscess: Secondary | ICD-10-CM | POA: Diagnosis not present

## 2017-02-22 DIAGNOSIS — N6032 Fibrosclerosis of left breast: Secondary | ICD-10-CM | POA: Diagnosis not present

## 2017-02-22 DIAGNOSIS — N6321 Unspecified lump in the left breast, upper outer quadrant: Secondary | ICD-10-CM | POA: Diagnosis not present

## 2017-02-27 DIAGNOSIS — H5203 Hypermetropia, bilateral: Secondary | ICD-10-CM | POA: Diagnosis not present

## 2017-02-27 DIAGNOSIS — H40023 Open angle with borderline findings, high risk, bilateral: Secondary | ICD-10-CM | POA: Diagnosis not present

## 2017-02-27 DIAGNOSIS — H5713 Ocular pain, bilateral: Secondary | ICD-10-CM | POA: Diagnosis not present

## 2017-02-27 DIAGNOSIS — E113293 Type 2 diabetes mellitus with mild nonproliferative diabetic retinopathy without macular edema, bilateral: Secondary | ICD-10-CM | POA: Diagnosis not present

## 2017-02-27 DIAGNOSIS — H43391 Other vitreous opacities, right eye: Secondary | ICD-10-CM | POA: Diagnosis not present

## 2017-02-27 DIAGNOSIS — H353132 Nonexudative age-related macular degeneration, bilateral, intermediate dry stage: Secondary | ICD-10-CM | POA: Diagnosis not present

## 2017-02-27 DIAGNOSIS — H52223 Regular astigmatism, bilateral: Secondary | ICD-10-CM | POA: Diagnosis not present

## 2017-02-27 DIAGNOSIS — H524 Presbyopia: Secondary | ICD-10-CM | POA: Diagnosis not present

## 2017-03-09 ENCOUNTER — Other Ambulatory Visit: Payer: Self-pay | Admitting: General Surgery

## 2017-03-09 DIAGNOSIS — N6320 Unspecified lump in the left breast, unspecified quadrant: Secondary | ICD-10-CM

## 2017-03-09 DIAGNOSIS — E785 Hyperlipidemia, unspecified: Secondary | ICD-10-CM | POA: Diagnosis not present

## 2017-03-09 DIAGNOSIS — Z9079 Acquired absence of other genital organ(s): Secondary | ICD-10-CM | POA: Diagnosis not present

## 2017-03-09 DIAGNOSIS — Z794 Long term (current) use of insulin: Secondary | ICD-10-CM | POA: Diagnosis not present

## 2017-03-09 DIAGNOSIS — Z90722 Acquired absence of ovaries, bilateral: Secondary | ICD-10-CM | POA: Diagnosis not present

## 2017-03-09 DIAGNOSIS — Z90710 Acquired absence of both cervix and uterus: Secondary | ICD-10-CM | POA: Diagnosis not present

## 2017-03-09 DIAGNOSIS — Z9049 Acquired absence of other specified parts of digestive tract: Secondary | ICD-10-CM | POA: Diagnosis not present

## 2017-03-09 DIAGNOSIS — E119 Type 2 diabetes mellitus without complications: Secondary | ICD-10-CM | POA: Diagnosis not present

## 2017-03-09 DIAGNOSIS — E039 Hypothyroidism, unspecified: Secondary | ICD-10-CM | POA: Diagnosis not present

## 2017-03-11 NOTE — H&P (Signed)
Damien Fusi Location: [facility] Patient #: [id number] DOB: Sep 09, 1950 Widowed / Language: Undefined / Race: Undefined Female         History of Present Illness       This is a very pleasant 67 year old female from [place]. She speaks English reasonably well. Her sister is with her throughout the encounter and she speaks excellent [place]. Her PCP is Dr. Posey Rea.. Her endocrinologist is Dr. Everardo All. She was referred by Dr. Tilda Burrow at [facility] imaging for a palpable mass at the 12 o'clock position of the left breast      The patient denies any history of trauma or infection in either breast. She's noticed a painless lump in her left breast for a few years. She recently had diagnostic mammograms and ultrasound. There was a 4.2 cm irregular mass with irregular margin in the left breast at 12 o'clock position, 3 cm from the nipple. Highly suggestive of malignancy. Image guided biopsy showed only dense fibrosis and adenosis. Excision was recommended. I agree that this should be excised to clarify the diagnosis     Past history reveals no prior breast problems. Insulin-dependent diabetes. Hyperlipidemia. Hypothyroidism. Appendectomy. Cholecystectomy. She reports TAH and BSO in [place] 6 years ago. She says there may have been early cancer in the specimen. Apparently she has seen Dr. Elease Hashimoto for atypical chest pain but workup was negative. See his notes from May 2018. He noted the breast mass at that time Family history is negative for breast or ovarian cancer social history she is a widow. Has 3 children. Denies alcohol or tobacco. Is from [place]. Lives with her sister      Exam reveals a dominant mass left breast 12 o'clock position. This needs to be excised. She and her sister agree She'll be scheduled for left breast lumpectomy with radioactive seed localization. I discussed the indications, details, techniques, and risk of the surgery in detail.  They're aware of the risk of bleeding, infection, reoperation if cancer, nerve damage with chronic pain, cosmetic deformity.     She understands all these issues very well. All of her questions are answered. She agrees with this plan.   Past Surgical History Appendectomy  Breast Biopsy  Left. Cataract Surgery  Bilateral. Hysterectomy (due to cancer) - Partial   Diagnostic Studies History  Colonoscopy  never Mammogram  within last year  Allergies  Sulfa Drugs  Penicillins  Allergies Reconciled   Medication History  DilTIAZem HCl (30MG  Tablet, Oral) Active. Levothyroxine Sodium ( Tablet, Oral) Active. Synjardy (5-1000MG  Tablet, Oral) Active. Simvastatin (20MG  Tablet, Oral) Active. Losartan Potassium (50MG  Tablet, Oral) Active. Levemir FlexTouch (100UNIT/ML Soln Pen-inj, Subcutaneous) Active. Aspirin (81MG  Tablet DR, Oral) Active. Medications Reconciled  Social History  Caffeine use  Coffee. No alcohol use  Tobacco use  Never smoker.  Family History Diabetes Mellitus  Father. Heart disease in female family member before age 86   Pregnancy / Birth History  Age at menarche  12 years. Age of menopause  [age] Gravida  3 Length (months) of breastfeeding  7-12 Maternal age  43-25 Para  3  Other Problems  Arthritis  Diabetes Mellitus  Gastroesophageal Reflux Disease  Hypercholesterolemia  Lump In Breast  Thyroid Disease     Review of Systems  General Not Present- Appetite Loss, Chills, Fatigue, Fever, Night Sweats, Weight Gain and Weight Loss. Skin Not Present- Change in Wart/Mole, Dryness, Hives, Jaundice, New Lesions, Non-Healing Wounds, Rash and Ulcer. HEENT Present- Wears glasses/contact lenses. Not Present- Earache, Hearing Loss, Hoarseness,  Nose Bleed, Oral Ulcers, Ringing in the Ears, Seasonal Allergies, Sinus Pain, Sore Throat, Visual Disturbances and Yellow Eyes. Respiratory Present- Snoring. Not Present- Bloody  sputum, Chronic Cough, Difficulty Breathing and Wheezing. Breast Present- Breast Mass. Not Present- Breast Pain, Nipple Discharge and Skin Changes. Cardiovascular Not Present- Chest Pain, Difficulty Breathing Lying Down, Leg Cramps, Palpitations, Rapid Heart Rate, Shortness of Breath and Swelling of Extremities. Gastrointestinal Not Present- Abdominal Pain, Bloating, Bloody Stool, Change in Bowel Habits, Chronic diarrhea, Constipation, Difficulty Swallowing, Excessive gas, Gets full quickly at meals, Hemorrhoids, Indigestion, Nausea, Rectal Pain and Vomiting. Female Genitourinary Not Present- Frequency, Nocturia, Painful Urination, Pelvic Pain and Urgency. Musculoskeletal Present- Joint Pain and Muscle Weakness. Not Present- Back Pain, Joint Stiffness, Muscle Pain and Swelling of Extremities. Psychiatric Not Present- Anxiety, Bipolar, Change in Sleep Pattern, Depression, Fearful and Frequent crying. Endocrine Present- Cold Intolerance and Heat Intolerance. Not Present- Excessive Hunger, Hair Changes, Hot flashes and New Diabetes.  Vitals Weight: 142.13 lb Height: 62in Body Surface Area: 1.65 m Body Mass Index: 25.99 kg/m  Temp.: 27F(Oral)  Pulse: 91 (Regular)  BP: 114/58 (Sitting, Left Arm, Standard)       Physical Exam  General Mental Status-Alert. General Appearance-Consistent with stated age. Hydration-Well hydrated. Voice-Normal.  Head and Neck Head-normocephalic, atraumatic with no lesions or palpable masses. Trachea-midline. Thyroid Gland Characteristics - normal size and consistency.  Eye Eyeball - Bilateral-Extraocular movements intact. Sclera/Conjunctiva - Bilateral-No scleral icterus.  Chest and Lung Exam Chest and lung exam reveals -quiet, even and easy respiratory effort with no use of accessory muscles and on auscultation, normal breath sounds, no adventitious sounds and normal vocal resonance. Inspection Chest Wall - Normal. Back  - normal.  Breast Note: Breasts are symmetrical and medium size. No skin changes. There is a painless palpable mass at the 12 o'clock position of the left breast above the areolar margin. 3 x 4 cm. Quite mobile. Not fixed to anything. No other masses in either breast. No axillary adenopathy on either side.   Cardiovascular Cardiovascular examination reveals -normal heart sounds, regular rate and rhythm with no murmurs and normal pedal pulses bilaterally.  Abdomen Inspection Inspection of the abdomen reveals - No Hernias. Palpation/Percussion Palpation and Percussion of the abdomen reveal - Soft, Non Tender, No Rebound tenderness, No Rigidity (guarding) and No hepatosplenomegaly. Auscultation Auscultation of the abdomen reveals - Bowel sounds normal.  Neurologic Neurologic evaluation reveals -alert and oriented x 3 with no impairment of recent or remote memory. Mental Status-Normal.  Musculoskeletal Normal Exam - Left-Upper Extremity Strength Normal and Lower Extremity Strength Normal. Normal Exam - Right-Upper Extremity Strength Normal and Lower Extremity Strength Normal.  Lymphatic Head & Neck  General Head & Neck Lymphatics: Bilateral - Description - Normal. Axillary  General Axillary Region: Bilateral - Description - Normal. Tenderness - Non Tender. Femoral & Inguinal  Generalized Femoral & Inguinal Lymphatics: Bilateral - Description - Normal. Tenderness - Non Tender.    Assessment & Plan   LEFT BREAST MASS (N63.20)      You have a large mass in your left breast at the 12 o'clock position The x-ray findings showed this to be suspicious for cancer The ultrasound-guided biopsy does not show cancer but shows dense fibrosis We are concerned that there could still be cancer present, and we recommend that this area be excised to clarify the diagnosis You agree with this  you will be scheduled for left breast lumpectomy with radioactive seed  localization We have discussed the indications, techniques, and risk  of the surgery in detail  TYPE 2 DIABETES MELLITUS WITH INSULIN THERAPY (E11.9) HYPERLIPIDEMIA, ACQUIRED (E78.5) HYPOTHYROIDISM, ADULT (E03.9) HISTORY OF TOTAL ABDOMINAL HYSTERECTOMY AND BILATERAL SALPINGO-OOPHORECTOMY (Z90.710) Impression: 6 years ago. In [place]. There may have been neoplastic change. No rectal HISTORY OF CHOLECYSTECTOMY (Z90.49)    Angelia Mould. Derrell Lolling, M.D., [facility] General and Minimally invasive Surgery Breast and Colorectal Surgery Office:   [phone] Pager:   [phone]

## 2017-03-13 ENCOUNTER — Encounter: Payer: Self-pay | Admitting: Internal Medicine

## 2017-03-13 ENCOUNTER — Ambulatory Visit (INDEPENDENT_AMBULATORY_CARE_PROVIDER_SITE_OTHER): Payer: Medicare Other | Admitting: Internal Medicine

## 2017-03-13 VITALS — BP 114/62 | HR 78 | Temp 98.4°F | Ht 62.0 in | Wt 142.0 lb

## 2017-03-13 DIAGNOSIS — E039 Hypothyroidism, unspecified: Secondary | ICD-10-CM

## 2017-03-13 DIAGNOSIS — E785 Hyperlipidemia, unspecified: Secondary | ICD-10-CM

## 2017-03-13 DIAGNOSIS — Z794 Long term (current) use of insulin: Secondary | ICD-10-CM

## 2017-03-13 DIAGNOSIS — N63 Unspecified lump in unspecified breast: Secondary | ICD-10-CM | POA: Diagnosis not present

## 2017-03-13 DIAGNOSIS — N6322 Unspecified lump in the left breast, upper inner quadrant: Secondary | ICD-10-CM | POA: Diagnosis not present

## 2017-03-13 DIAGNOSIS — I10 Essential (primary) hypertension: Secondary | ICD-10-CM | POA: Diagnosis not present

## 2017-03-13 DIAGNOSIS — E1142 Type 2 diabetes mellitus with diabetic polyneuropathy: Secondary | ICD-10-CM

## 2017-03-13 DIAGNOSIS — N6321 Unspecified lump in the left breast, upper outer quadrant: Secondary | ICD-10-CM | POA: Diagnosis not present

## 2017-03-13 MED ORDER — LOSARTAN POTASSIUM 50 MG PO TABS: 50.0000 mg | ORAL_TABLET | Freq: Every day | ORAL | 3 refills | Status: DC

## 2017-03-13 MED ORDER — ASPIRIN 81 MG PO CHEW: 81.0000 mg | CHEWABLE_TABLET | Freq: Every day | ORAL | 3 refills | Status: AC

## 2017-03-13 MED ORDER — DILTIAZEM HCL 30 MG PO TABS: 30.0000 mg | ORAL_TABLET | Freq: Two times a day (BID) | ORAL | 3 refills | Status: DC

## 2017-03-13 MED ORDER — LEVOTHYROXINE SODIUM 50 MCG PO TABS: 50.0000 ug | ORAL_TABLET | Freq: Every day | ORAL | 3 refills | Status: DC

## 2017-03-13 NOTE — Assessment & Plan Note (Signed)
Levemir Synjardy  

## 2017-03-13 NOTE — Assessment & Plan Note (Signed)
Simvastatin Labs 

## 2017-03-13 NOTE — Assessment & Plan Note (Signed)
L breast tumor - Dr Derrell LollingIngram is going to do a lumpectomy

## 2017-03-13 NOTE — Progress Notes (Signed)
Subjective:  Patient ID: Barbara White, female    DOB: 09-21-1950  Age: 67 y.o. MRN: [id number]  CC: No chief complaint on file.   HPI Barbara White presents for L breast tumor - Dr Barbara White is going to do a lumpectomy ina few days F/u DM - Dr Barbara White  Outpatient Medications Prior to Visit  Medication Sig Dispense Refill  . aspirin 81 MG chewable tablet Chew 1 tablet (81 mg total) by mouth daily. 30 tablet 3  . blood glucose meter kit and supplies KIT Dispense based on patient and insurance preference. Use up to four times daily as directed. (FOR ICD-9 250.00, 250.01). 1 each 0  . Cholecalciferol (VITAMIN D3) 2000 units capsule Take 1 capsule (2,000 Units total) daily by mouth. 100 capsule 3  . diltiazem (CARDIZEM) 30 MG tablet TAKE ONE TABLET BY MOUTH TWICE A DAY 60 tablet 11  . Empagliflozin-Metformin HCl (SYNJARDY) 05-998 MG TABS Take 1 tablet by mouth 2 (two) times daily. 180 tablet 3  . Insulin Detemir (LEVEMIR FLEXPEN) 100 UNIT/ML Pen 35 units each morning and 15 units each evening, and pen needles 2/day 10 pen 11  . Insulin Pen Needle (B-D ULTRAFINE III SHORT PEN) 31G X 8 MM MISC Use to administer insulin at bedtime w/Solostar Pen. Dx E11.9 30 each 11  . levothyroxine (SYNTHROID, LEVOTHROID) 50 MCG tablet TAKE ONE TABLET BY MOUTH DAILY 30 tablet 11  . omeprazole (PRILOSEC) 20 MG capsule TAKE ONE CAPSULE BY MOUTH DAILY 90 capsule 1  . simvastatin (ZOCOR) 20 MG tablet TAKE ONE TABLET BY MOUTH DAILY AT 6:00PM 90 tablet 3  . losartan (COZAAR) 50 MG tablet Take 1 tablet (50 mg total) by mouth daily. 90 tablet 3   No facility-administered medications prior to visit.     ROS Review of Systems  Constitutional: Negative for activity change, appetite change, chills, fatigue and unexpected weight change.  HENT: Negative for congestion, mouth sores and sinus pressure.   Eyes: Negative for visual disturbance.  Respiratory: Negative for cough and chest tightness.   Gastrointestinal:  Negative for abdominal pain and nausea.  Genitourinary: Negative for difficulty urinating, frequency and vaginal pain.  Musculoskeletal: Negative for back pain and gait problem.  Skin: Negative for pallor and rash.  Neurological: Negative for dizziness, tremors, weakness, numbness and headaches.  Psychiatric/Behavioral: Negative for confusion and sleep disturbance.    Objective:  BP 114/62 (BP Location: Left Arm, Patient Position: Sitting, Cuff Size: Normal)   Pulse 78   Temp 98.4 F (36.9 C) (Oral)   Ht 5' 2"  (1.575 m)   Wt 142 lb (64.4 kg)   SpO2 98%   BMI 25.97 kg/m   BP Readings from Last 3 Encounters:  03/13/17 114/62  02/16/17 111/66  01/15/17 106/62    Wt Readings from Last 3 Encounters:  03/13/17 142 lb (64.4 kg)  02/16/17 140 lb 3.2 oz (63.6 kg)  01/15/17 141 lb 3.2 oz (64 kg)    Physical Exam  Constitutional: She appears well-developed. No distress.  HENT:  Head: Normocephalic.  Right Ear: External ear normal.  Left Ear: External ear normal.  Nose: Nose normal.  Mouth/Throat: Oropharynx is clear and moist.  Eyes: Conjunctivae are normal. Pupils are equal, round, and reactive to light. Right eye exhibits no discharge. Left eye exhibits no discharge.  Neck: Normal range of motion. Neck supple. No JVD present. No tracheal deviation present. No thyromegaly present.  Cardiovascular: Normal rate, regular rhythm and normal heart sounds.  Pulmonary/Chest: No stridor. No  respiratory distress. She has no wheezes.  Abdominal: Soft. Bowel sounds are normal. She exhibits no distension and no mass. There is no tenderness. There is no rebound and no guarding.  Musculoskeletal: She exhibits no edema or tenderness.  Lymphadenopathy:    She has no cervical adenopathy.  Neurological: She displays normal reflexes. No cranial nerve deficit. She exhibits normal muscle tone. Coordination normal.  Skin: No rash noted. No erythema.  Psychiatric: She has a normal mood and affect. Her  behavior is normal. Judgment and thought content normal.    Lab Results  Component Value Date   GLUCOSE 152 (H) 12/05/2016   ALT 24 12/30/2015   AST 25 12/30/2015   NA 136 12/05/2016   K 4.4 12/05/2016   CL 101 12/05/2016   CREATININE 1.02 12/05/2016   BUN 16 12/05/2016   CO2 27 12/05/2016   TSH 1.50 12/05/2016   HGBA1C 8.0 02/16/2017   MICROALBUR <0.7 12/05/2016    Ct Abdomen Pelvis W Contrast  Result Date: 01/13/2016 CLINICAL DATA:  Right lower quadrant pain EXAM: CT ABDOMEN AND PELVIS WITH CONTRAST TECHNIQUE: Multidetector CT imaging of the abdomen and pelvis was performed using the standard protocol following bolus administration of intravenous contrast. CONTRAST:  100 mL Isovue-300. COMPARISON:  None. FINDINGS: Lower chest: No acute abnormality. Hepatobiliary: The liver is diffusely fatty infiltrated. The gallbladder has been surgically removed. Pancreas: Unremarkable. No pancreatic ductal dilatation or surrounding inflammatory changes. Spleen: Normal in size without focal abnormality. Adrenals/Urinary Tract: The adrenal glands are within normal limits. A few scattered small cysts are seen. No calculi or obstructive changes are noted. The bladder is partially distended. Stomach/Bowel: Scattered diverticular change of the colon is noted without diverticulitis. The appendix is not visualized consistent with a prior surgical history. No obstructive changes are seen. Vascular/Lymphatic: Aortic atherosclerosis. No enlarged abdominal or pelvic lymph nodes. Reproductive: Status post hysterectomy. No adnexal masses. Other: No abdominal wall hernia or abnormality. No abdominopelvic ascites. Musculoskeletal: No acute or significant osseous findings. IMPRESSION: Chronic changes as described above without acute abnormality. Postsurgical changes. Electronically Signed   By: Barbara White M.D.   On: 01/13/2016 09:55    Assessment & Plan:   There are no diagnoses linked to this encounter. I am  having Barbara White maintain her aspirin, Insulin Pen Needle, blood glucose meter kit and supplies, levothyroxine, diltiazem, losartan, omeprazole, Empagliflozin-Metformin HCl, simvastatin, Vitamin D3, and Insulin Detemir.  No orders of the defined types were placed in this encounter.    Follow-up: No Follow-up on file.  Walker Kehr, MD

## 2017-03-13 NOTE — Assessment & Plan Note (Signed)
Levothroid °Labs °

## 2017-03-13 NOTE — Assessment & Plan Note (Signed)
Diltiazem Losartan 

## 2017-03-14 ENCOUNTER — Encounter (HOSPITAL_COMMUNITY): Payer: Self-pay | Admitting: *Deleted

## 2017-03-14 ENCOUNTER — Other Ambulatory Visit: Payer: Self-pay

## 2017-03-14 NOTE — Progress Notes (Signed)
Crescentia Ahmed (DPR) made aware to have pt take 7 units of Levemir insulin the night before surgery, check blood glucose (BG) the morning of surgery if BG is > 70 take 17 units of Levemir insulin . Do not take Empagliflozin-Metformin HCl (SYNJARDY) the morning of procedure. DPR made aware to have pt check BG every 2 hours prior to arrival to hospital on DOS. Pt made aware to treat a BG < 70 with 4 ounces of apple  juice, wait 15 minutes after intervention to recheck BG, if BG remains < 70, call Short Stay unit to speak with a nurse. DPR verbalized understanding of all pre-op instructions.

## 2017-03-14 NOTE — Progress Notes (Signed)
Pt SDW-pre-op call completed by Patsy BaltimoreSyeda Ahmed (DPR). DPR denies that pt C/O any acute cardiopulmonary issues. DPR stated that pt is under the care of Dr. Elease HashimotoNahser, Cardiology. DPR denies that pt had a stress test, echo and cardiac cath. DPR denies that pt hd an EKG and chest x ray within the last year. DPR denies that pt had recent labs. DPR stated that pt was instructed to stop taking Aspirin. DPR made aware to have pt stop taking vitamins, fish oil and herbal medications. Do not take any NSAIDs ie: Ibuprofen, Advil, Naproxen (Aleve), Motrin, BC and Goody Powder. DPR verbalized understanding of all pre-op instructions.

## 2017-03-16 ENCOUNTER — Ambulatory Visit (HOSPITAL_COMMUNITY): Payer: Medicare Other | Admitting: Certified Registered"

## 2017-03-16 ENCOUNTER — Encounter (HOSPITAL_COMMUNITY)
Admission: RE | Disposition: A | Discharge status: Home / Self Care | Payer: Self-pay | Source: Ambulatory Visit | Attending: General Surgery

## 2017-03-16 ENCOUNTER — Encounter (HOSPITAL_COMMUNITY): Payer: Self-pay | Admitting: Urology

## 2017-03-16 ENCOUNTER — Ambulatory Visit (HOSPITAL_COMMUNITY)
Admission: RE | Admit: 2017-03-16 | Discharge: 2017-03-16 | Disposition: A | Discharge status: Home / Self Care | Payer: Medicare Other | Source: Ambulatory Visit | Attending: General Surgery | Admitting: General Surgery

## 2017-03-16 ENCOUNTER — Other Ambulatory Visit: Payer: Self-pay

## 2017-03-16 DIAGNOSIS — Z79890 Hormone replacement therapy (postmenopausal): Secondary | ICD-10-CM | POA: Diagnosis not present

## 2017-03-16 DIAGNOSIS — Z794 Long term (current) use of insulin: Secondary | ICD-10-CM | POA: Insufficient documentation

## 2017-03-16 DIAGNOSIS — K219 Gastro-esophageal reflux disease without esophagitis: Secondary | ICD-10-CM | POA: Insufficient documentation

## 2017-03-16 DIAGNOSIS — N6022 Fibroadenosis of left breast: Secondary | ICD-10-CM | POA: Insufficient documentation

## 2017-03-16 DIAGNOSIS — Z79899 Other long term (current) drug therapy: Secondary | ICD-10-CM | POA: Insufficient documentation

## 2017-03-16 DIAGNOSIS — E119 Type 2 diabetes mellitus without complications: Secondary | ICD-10-CM | POA: Insufficient documentation

## 2017-03-16 DIAGNOSIS — N6032 Fibrosclerosis of left breast: Secondary | ICD-10-CM | POA: Insufficient documentation

## 2017-03-16 DIAGNOSIS — N6012 Diffuse cystic mastopathy of left breast: Secondary | ICD-10-CM | POA: Diagnosis not present

## 2017-03-16 DIAGNOSIS — I10 Essential (primary) hypertension: Secondary | ICD-10-CM | POA: Insufficient documentation

## 2017-03-16 DIAGNOSIS — N63 Unspecified lump in unspecified breast: Secondary | ICD-10-CM

## 2017-03-16 DIAGNOSIS — E785 Hyperlipidemia, unspecified: Secondary | ICD-10-CM | POA: Diagnosis not present

## 2017-03-16 DIAGNOSIS — N6320 Unspecified lump in the left breast, unspecified quadrant: Secondary | ICD-10-CM | POA: Diagnosis not present

## 2017-03-16 DIAGNOSIS — E039 Hypothyroidism, unspecified: Secondary | ICD-10-CM | POA: Diagnosis not present

## 2017-03-16 DIAGNOSIS — N6322 Unspecified lump in the left breast, upper inner quadrant: Secondary | ICD-10-CM | POA: Diagnosis present

## 2017-03-16 HISTORY — DX: Unspecified lump in the left breast, unspecified quadrant: N63.20

## 2017-03-16 HISTORY — PX: BREAST LUMPECTOMY WITH RADIOACTIVE SEED LOCALIZATION: SHX6424

## 2017-03-16 HISTORY — DX: Polyneuropathy, unspecified: G62.9

## 2017-03-16 HISTORY — DX: Unspecified osteoarthritis, unspecified site: M19.90

## 2017-03-16 HISTORY — DX: Essential (primary) hypertension: I10

## 2017-03-16 HISTORY — DX: Hyperlipidemia, unspecified: E78.5

## 2017-03-16 HISTORY — DX: Headache: R51

## 2017-03-16 HISTORY — DX: Gastro-esophageal reflux disease without esophagitis: K21.9

## 2017-03-16 HISTORY — DX: Headache, unspecified: R51.9

## 2017-03-16 LAB — COMPREHENSIVE METABOLIC PANEL
ALK PHOS: 42 U/L (ref 38–126)
ALT: 15 U/L (ref 14–54)
AST: 21 U/L (ref 15–41)
Albumin: 3.9 g/dL (ref 3.5–5.0)
Anion gap: 9 (ref 5–15)
BILIRUBIN TOTAL: 0.5 mg/dL (ref 0.3–1.2)
BUN: 13 mg/dL (ref 6–20)
CALCIUM: 9 mg/dL (ref 8.9–10.3)
CO2: 25 mmol/L (ref 22–32)
CREATININE: 1.03 mg/dL — AB (ref 0.44–1.00)
Chloride: 104 mmol/L (ref 101–111)
GFR calc non Af Amer: 55 mL/min — ABNORMAL LOW (ref >60)
GLUCOSE: 104 mg/dL — AB (ref 65–99)
Potassium: 4.2 mmol/L (ref 3.5–5.1)
SODIUM: 138 mmol/L (ref 135–145)
TOTAL PROTEIN: 6.9 g/dL (ref 6.5–8.1)

## 2017-03-16 LAB — CBC WITH DIFFERENTIAL/PLATELET
Basophils Absolute: 0 10*3/uL (ref 0.0–0.1)
Basophils Relative: 0 %
EOS ABS: 0.2 10*3/uL (ref 0.0–0.7)
Eosinophils Relative: 3 %
HEMATOCRIT: 40.3 % (ref 36.0–46.0)
HEMOGLOBIN: 12.6 g/dL (ref 12.0–15.0)
LYMPHS ABS: 2.5 10*3/uL (ref 0.7–4.0)
LYMPHS PCT: 36 %
MCH: 25.5 pg — AB (ref 26.0–34.0)
MCHC: 31.3 g/dL (ref 30.0–36.0)
MCV: 81.4 fL (ref 78.0–100.0)
MONOS PCT: 5 %
Monocytes Absolute: 0.4 10*3/uL (ref 0.1–1.0)
NEUTROS ABS: 3.9 10*3/uL (ref 1.7–7.7)
NEUTROS PCT: 56 %
Platelets: 238 10*3/uL (ref 150–400)
RBC: 4.95 MIL/uL (ref 3.87–5.11)
RDW: 13.9 % (ref 11.5–15.5)
WBC: 6.9 10*3/uL (ref 4.0–10.5)

## 2017-03-16 LAB — GLUCOSE, CAPILLARY: Glucose-Capillary: 86 mg/dL (ref 65–99)

## 2017-03-16 SURGERY — BREAST LUMPECTOMY WITH RADIOACTIVE SEED LOCALIZATION
Anesthesia: General | Site: Breast | Laterality: Left

## 2017-03-16 MED ORDER — PHENYLEPHRINE 40 MCG/ML (10ML) SYRINGE FOR IV PUSH (FOR BLOOD PRESSURE SUPPORT)
PREFILLED_SYRINGE | INTRAVENOUS | Status: AC
  Filled 2017-03-16: qty 10

## 2017-03-16 MED ORDER — SODIUM CHLORIDE 0.9% FLUSH: 3.0000 mL | INTRAVENOUS | Status: DC | PRN

## 2017-03-16 MED ORDER — ACETAMINOPHEN 650 MG RE SUPP: 650.0000 mg | RECTAL | Status: DC | PRN

## 2017-03-16 MED ORDER — KETAMINE HCL 10 MG/ML IJ SOLN
INTRAMUSCULAR | Status: DC | PRN
  Administered 2017-03-16: 30 mg via INTRAVENOUS

## 2017-03-16 MED ORDER — CHLORHEXIDINE GLUCONATE CLOTH 2 % EX PADS: 6.0000 | MEDICATED_PAD | Freq: Once | CUTANEOUS | Status: DC

## 2017-03-16 MED ORDER — ACETAMINOPHEN 500 MG PO TABS
1000.0000 mg | ORAL_TABLET | ORAL | Status: AC
  Administered 2017-03-16: 1000 mg via ORAL

## 2017-03-16 MED ORDER — OXYCODONE HCL 5 MG PO TABS
ORAL_TABLET | ORAL | Status: AC
  Filled 2017-03-16: qty 1

## 2017-03-16 MED ORDER — BUPIVACAINE-EPINEPHRINE (PF) 0.5% -1:200000 IJ SOLN
INTRAMUSCULAR | Status: AC
  Filled 2017-03-16: qty 30

## 2017-03-16 MED ORDER — ONDANSETRON HCL 4 MG/2ML IJ SOLN: 4.0000 mg | Freq: Once | INTRAMUSCULAR | Status: DC | PRN

## 2017-03-16 MED ORDER — ONDANSETRON HCL 4 MG/2ML IJ SOLN
INTRAMUSCULAR | Status: DC | PRN
  Administered 2017-03-16: 4 mg via INTRAVENOUS

## 2017-03-16 MED ORDER — OXYCODONE HCL 5 MG PO TABS: 5.0000 mg | ORAL_TABLET | ORAL | Status: DC | PRN

## 2017-03-16 MED ORDER — 0.9 % SODIUM CHLORIDE (POUR BTL) OPTIME
TOPICAL | Status: DC | PRN
  Administered 2017-03-16: 1000 mL

## 2017-03-16 MED ORDER — ONDANSETRON HCL 4 MG/2ML IJ SOLN
INTRAMUSCULAR | Status: AC
  Filled 2017-03-16: qty 2

## 2017-03-16 MED ORDER — PROPOFOL 10 MG/ML IV BOLUS
INTRAVENOUS | Status: AC
Start: 2017-03-16 — End: ?
  Filled 2017-03-16: qty 20

## 2017-03-16 MED ORDER — ACETAMINOPHEN 500 MG PO TABS
ORAL_TABLET | ORAL | Status: AC
  Administered 2017-03-16: 1000 mg via ORAL
  Filled 2017-03-16: qty 2

## 2017-03-16 MED ORDER — FENTANYL CITRATE (PF) 250 MCG/5ML IJ SOLN
INTRAMUSCULAR | Status: AC
Start: 2017-03-16 — End: ?
  Filled 2017-03-16: qty 5

## 2017-03-16 MED ORDER — PHENYLEPHRINE 40 MCG/ML (10ML) SYRINGE FOR IV PUSH (FOR BLOOD PRESSURE SUPPORT)
PREFILLED_SYRINGE | INTRAVENOUS | Status: DC | PRN
  Administered 2017-03-16 (×5): 80 ug via INTRAVENOUS

## 2017-03-16 MED ORDER — EPHEDRINE 5 MG/ML INJ
INTRAVENOUS | Status: AC
  Filled 2017-03-16: qty 10

## 2017-03-16 MED ORDER — LACTATED RINGERS IV SOLN: INTRAVENOUS | Status: DC

## 2017-03-16 MED ORDER — GABAPENTIN 300 MG PO CAPS
300.0000 mg | ORAL_CAPSULE | ORAL | Status: AC
  Administered 2017-03-16: 300 mg via ORAL

## 2017-03-16 MED ORDER — MIDAZOLAM HCL 2 MG/2ML IJ SOLN
INTRAMUSCULAR | Status: AC
  Filled 2017-03-16: qty 2

## 2017-03-16 MED ORDER — GABAPENTIN 300 MG PO CAPS
ORAL_CAPSULE | ORAL | Status: AC
  Administered 2017-03-16: 300 mg via ORAL
  Filled 2017-03-16: qty 1

## 2017-03-16 MED ORDER — CEFAZOLIN SODIUM-DEXTROSE 1-4 GM/50ML-% IV SOLN
INTRAVENOUS | Status: DC | PRN
  Administered 2017-03-16: 2 g via INTRAVENOUS

## 2017-03-16 MED ORDER — FENTANYL CITRATE (PF) 100 MCG/2ML IJ SOLN
INTRAMUSCULAR | Status: AC
  Filled 2017-03-16: qty 2

## 2017-03-16 MED ORDER — OXYCODONE HCL 5 MG PO TABS
5.0000 mg | ORAL_TABLET | Freq: Once | ORAL | Status: AC | PRN
  Administered 2017-03-16: 5 mg via ORAL

## 2017-03-16 MED ORDER — ACETAMINOPHEN 325 MG PO TABS: 650.0000 mg | ORAL_TABLET | ORAL | Status: DC | PRN

## 2017-03-16 MED ORDER — PROPOFOL 10 MG/ML IV BOLUS
INTRAVENOUS | Status: DC | PRN
  Administered 2017-03-16: 150 mg via INTRAVENOUS

## 2017-03-16 MED ORDER — FENTANYL CITRATE (PF) 100 MCG/2ML IJ SOLN: 25.0000 ug | INTRAMUSCULAR | Status: DC | PRN

## 2017-03-16 MED ORDER — HYDROCODONE-ACETAMINOPHEN 5-325 MG PO TABS: 1.0000 | ORAL_TABLET | Freq: Four times a day (QID) | ORAL | 0 refills | Status: DC | PRN

## 2017-03-16 MED ORDER — FENTANYL CITRATE (PF) 250 MCG/5ML IJ SOLN
INTRAMUSCULAR | Status: DC | PRN
  Administered 2017-03-16: 25 ug via INTRAVENOUS

## 2017-03-16 MED ORDER — KETAMINE HCL-SODIUM CHLORIDE 100-0.9 MG/10ML-% IV SOSY
PREFILLED_SYRINGE | INTRAVENOUS | Status: AC
  Filled 2017-03-16: qty 10

## 2017-03-16 MED ORDER — FENTANYL CITRATE (PF) 100 MCG/2ML IJ SOLN
25.0000 ug | INTRAMUSCULAR | Status: DC | PRN
  Administered 2017-03-16: 25 ug via INTRAVENOUS

## 2017-03-16 MED ORDER — DEXAMETHASONE SODIUM PHOSPHATE 10 MG/ML IJ SOLN
INTRAMUSCULAR | Status: AC
  Filled 2017-03-16: qty 1

## 2017-03-16 MED ORDER — DEXAMETHASONE SODIUM PHOSPHATE 10 MG/ML IJ SOLN
INTRAMUSCULAR | Status: DC | PRN
  Administered 2017-03-16: 4 mg via INTRAVENOUS

## 2017-03-16 MED ORDER — MIDAZOLAM HCL 5 MG/5ML IJ SOLN
INTRAMUSCULAR | Status: DC | PRN
  Administered 2017-03-16: 2 mg via INTRAVENOUS

## 2017-03-16 MED ORDER — SODIUM CHLORIDE 0.9 % IV SOLN: 250.0000 mL | INTRAVENOUS | Status: DC | PRN

## 2017-03-16 MED ORDER — LIDOCAINE 2% (20 MG/ML) 5 ML SYRINGE
INTRAMUSCULAR | Status: DC | PRN
  Administered 2017-03-16: 60 mg via INTRAVENOUS

## 2017-03-16 MED ORDER — BUPIVACAINE-EPINEPHRINE (PF) 0.5% -1:200000 IJ SOLN
INTRAMUSCULAR | Status: DC | PRN
  Administered 2017-03-16: 10 mL

## 2017-03-16 MED ORDER — LACTATED RINGERS IV SOLN
INTRAVENOUS | Status: DC
  Administered 2017-03-16: 13:00:00 via INTRAVENOUS

## 2017-03-16 MED ORDER — SODIUM CHLORIDE 0.9% FLUSH: 3.0000 mL | Freq: Two times a day (BID) | INTRAVENOUS | Status: DC

## 2017-03-16 MED ORDER — OXYCODONE HCL 5 MG/5ML PO SOLN: 5.0000 mg | Freq: Once | ORAL | Status: AC | PRN

## 2017-03-16 MED ORDER — CEFAZOLIN SODIUM 1 G IJ SOLR
INTRAMUSCULAR | Status: AC
  Filled 2017-03-16: qty 20

## 2017-03-16 MED ORDER — LIDOCAINE 2% (20 MG/ML) 5 ML SYRINGE
INTRAMUSCULAR | Status: AC
  Filled 2017-03-16: qty 5

## 2017-03-16 MED ORDER — EPHEDRINE SULFATE-NACL 50-0.9 MG/10ML-% IV SOSY
PREFILLED_SYRINGE | INTRAVENOUS | Status: DC | PRN
  Administered 2017-03-16: 15 mg via INTRAVENOUS

## 2017-03-16 SURGICAL SUPPLY — 45 items
APPLIER CLIP 9.375 MED OPEN (MISCELLANEOUS) ×2
BINDER BREAST LRG (GAUZE/BANDAGES/DRESSINGS) IMPLANT
BINDER BREAST XLRG (GAUZE/BANDAGES/DRESSINGS) ×2 IMPLANT
BLADE SURG 15 STRL LF DISP TIS (BLADE) ×1 IMPLANT
BLADE SURG 15 STRL SS (BLADE) ×1
CANISTER SUCT 3000ML PPV (MISCELLANEOUS) ×2 IMPLANT
CHLORAPREP W/TINT 26ML (MISCELLANEOUS) ×2 IMPLANT
CLIP APPLIE 9.375 MED OPEN (MISCELLANEOUS) ×1 IMPLANT
COVER PROBE W GEL 5X96 (DRAPES) ×2 IMPLANT
COVER SURGICAL LIGHT HANDLE (MISCELLANEOUS) ×2 IMPLANT
DERMABOND ADVANCED (GAUZE/BANDAGES/DRESSINGS) ×1
DERMABOND ADVANCED .7 DNX12 (GAUZE/BANDAGES/DRESSINGS) ×1 IMPLANT
DEVICE DUBIN SPECIMEN MAMMOGRA (MISCELLANEOUS) ×2 IMPLANT
DRAPE CHEST BREAST 15X10 FENES (DRAPES) ×2 IMPLANT
DRAPE UTILITY XL STRL (DRAPES) ×2 IMPLANT
DRSG PAD ABDOMINAL 8X10 ST (GAUZE/BANDAGES/DRESSINGS) ×2 IMPLANT
ELECT CAUTERY BLADE 6.4 (BLADE) ×2 IMPLANT
ELECT REM PT RETURN 9FT ADLT (ELECTROSURGICAL) ×2
ELECTRODE REM PT RTRN 9FT ADLT (ELECTROSURGICAL) ×1 IMPLANT
GAUZE SPONGE 4X4 12PLY STRL (GAUZE/BANDAGES/DRESSINGS) ×2 IMPLANT
GAUZE SPONGE 4X4 12PLY STRL LF (GAUZE/BANDAGES/DRESSINGS) ×2 IMPLANT
GLOVE EUDERMIC 7 POWDERFREE (GLOVE) ×4 IMPLANT
GOWN STRL REUS W/ TWL LRG LVL3 (GOWN DISPOSABLE) ×1 IMPLANT
GOWN STRL REUS W/ TWL XL LVL3 (GOWN DISPOSABLE) ×1 IMPLANT
GOWN STRL REUS W/TWL LRG LVL3 (GOWN DISPOSABLE) ×1
GOWN STRL REUS W/TWL XL LVL3 (GOWN DISPOSABLE) ×1
ILLUMINATOR WAVEGUIDE N/F (MISCELLANEOUS) IMPLANT
KIT BASIN OR (CUSTOM PROCEDURE TRAY) ×2 IMPLANT
KIT MARKER MARGIN INK (KITS) ×2 IMPLANT
LIGHT WAVEGUIDE WIDE FLAT (MISCELLANEOUS) IMPLANT
NEEDLE HYPO 25GX1X1/2 BEV (NEEDLE) ×2 IMPLANT
NS IRRIG 1000ML POUR BTL (IV SOLUTION) ×2 IMPLANT
PACK SURGICAL SETUP 50X90 (CUSTOM PROCEDURE TRAY) ×2 IMPLANT
PAD ABD 8X10 STRL (GAUZE/BANDAGES/DRESSINGS) ×2 IMPLANT
PENCIL BUTTON HOLSTER BLD 10FT (ELECTRODE) ×2 IMPLANT
SPONGE LAP 4X18 X RAY DECT (DISPOSABLE) ×2 IMPLANT
SUT MNCRL AB 4-0 PS2 18 (SUTURE) ×2 IMPLANT
SUT SILK 2 0 SH (SUTURE) ×2 IMPLANT
SUT VIC AB 3-0 SH 18 (SUTURE) ×2 IMPLANT
SYR BULB 3OZ (MISCELLANEOUS) ×2 IMPLANT
SYR CONTROL 10ML LL (SYRINGE) ×2 IMPLANT
TOWEL OR 17X24 6PK STRL BLUE (TOWEL DISPOSABLE) ×2 IMPLANT
TOWEL OR 17X26 10 PK STRL BLUE (TOWEL DISPOSABLE) ×2 IMPLANT
TUBE CONNECTING 12X1/4 (SUCTIONS) ×2 IMPLANT
YANKAUER SUCT BULB TIP NO VENT (SUCTIONS) ×2 IMPLANT

## 2017-03-16 NOTE — Transfer of Care (Signed)
Immediate Anesthesia Transfer of Care Note  Patient: Barbara FusiSyeda Prins  Procedure(s) Performed: LEFT BREAST LUMPECTOMY WITH RADIOACTIVE SEED LOCALIZATION ERAS PATHWAY (Left Breast)  Patient Location: PACU  Anesthesia Type:General  Level of Consciousness: drowsy and patient cooperative  Airway & Oxygen Therapy: Patient Spontanous Breathing and Patient connected to face mask oxygen  Post-op Assessment: Report given to RN and Post -op Vital signs reviewed and stable  Post vital signs: Reviewed and stable  Last Vitals:  Vitals:   03/16/17 1249  BP: (!) 123/51  Pulse: 68  Resp: 18  Temp: 36.6 C  SpO2: 97%    Last Pain:  Vitals:   03/16/17 1249  TempSrc: Oral      Patients Stated Pain Goal: 2 (03/16/17 1229)  Complications: No apparent anesthesia complications

## 2017-03-16 NOTE — Discharge Instructions (Signed)
[facility] °Office Phone Number [phone] ° °BREAST BIOPSY/ PARTIAL MASTECTOMY: POST OP INSTRUCTIONS ° °Always review your discharge instruction sheet given to you by the facility where your surgery was performed. ° °IF YOU HAVE DISABILITY OR FAMILY LEAVE FORMS, YOU MUST BRING THEM TO THE OFFICE FOR PROCESSING.  DO NOT GIVE THEM TO YOUR DOCTOR. ° °1. A prescription for pain medication may be given to you upon discharge.  Take your pain medication as prescribed, if needed.  If narcotic pain medicine is not needed, then you may take acetaminophen (Tylenol) or ibuprofen (Advil) as needed. °2. Take your usually prescribed medications unless otherwise directed °3. If you need a refill on your pain medication, please contact your pharmacy.  They will contact our office to request authorization.  Prescriptions will not be filled after 5pm or on week-ends. °4. You should eat very light the first 24 hours after surgery, such as soup, crackers, pudding, etc.  Resume your normal diet the day after surgery. °5. Most patients will experience some swelling and bruising in the breast.  Ice packs and a good support bra will help.  Swelling and bruising can take several days to resolve.  °6. It is common to experience some constipation if taking pain medication after surgery.  Increasing fluid intake and taking a stool softener will usually help or prevent this problem from occurring.  A mild laxative (Milk of Magnesia or Miralax) should be taken according to package directions if there are no bowel movements after 48 hours. °7. Unless discharge instructions indicate otherwise, you may remove your bandages 24-48 hours after surgery, and you may shower at that time.  You may have steri-strips (small skin tapes) in place directly over the incision.  These strips should be left on the skin for 7-10 days.  If your surgeon used skin glue on the incision, you may shower in 24 hours.  The glue will flake off over the  next 2-3 weeks.  Any sutures or staples will be removed at the office during your follow-up visit. °8. ACTIVITIES:  You may resume regular daily activities (gradually increasing) beginning the next day.  Wearing a good support bra or sports bra minimizes pain and swelling.  You may have sexual intercourse when it is comfortable. °a. You may drive when you no longer are taking prescription pain medication, you can comfortably wear a seatbelt, and you can safely maneuver your car and apply brakes. °b. RETURN TO WORK:  ______________________________________________________________________________________ °9. You should see your doctor in the office for a follow-up appointment approximately two weeks after your surgery.  Your doctor’s nurse will typically make your follow-up appointment when she calls you with your pathology report.  Expect your pathology report 2-3 business days after your surgery.  You may call to check if you do not hear from us after three days. °10. OTHER INSTRUCTIONS: _______________________________________________________________________________________________ _____________________________________________________________________________________________________________________________________ °_____________________________________________________________________________________________________________________________________ °_____________________________________________________________________________________________________________________________________ ° °WHEN TO CALL YOUR DOCTOR: °1. Fever over 101.0 °2. Nausea and/or vomiting. °3. Extreme swelling or bruising. °4. Continued bleeding from incision. °5. Increased pain, redness, or drainage from the incision. ° °The clinic staff is available to answer your questions during regular business hours.  Please don’t hesitate to call and ask to speak to one of the nurses for clinical concerns.  If you have a medical emergency, go to the nearest  emergency room or call 911.  A surgeon from [facility] is always on call at the hospital. ° °For further questions, please visit [email or web address]  °

## 2017-03-16 NOTE — Anesthesia Procedure Notes (Signed)
Procedure Name: LMA Insertion Date/Time: 03/16/2017 2:30 PM Performed by: Army FossaPulliam,  Dane, CRNA Pre-anesthesia Checklist: Patient identified, Emergency Drugs available, Suction available and Patient being monitored Patient Re-evaluated:Patient Re-evaluated prior to induction Oxygen Delivery Method: Circle System Utilized Preoxygenation: Pre-oxygenation with 100% oxygen Induction Type: IV induction Ventilation: Mask ventilation without difficulty LMA: LMA inserted LMA Size: 3.0 Number of attempts: 1 Airway Equipment and Method: Bite block Placement Confirmation: positive ETCO2 Tube secured with: Tape Dental Injury: Teeth and Oropharynx as per pre-operative assessment

## 2017-03-16 NOTE — Anesthesia Postprocedure Evaluation (Signed)
Anesthesia Post Note  Patient: Damien FusiSyeda Clapper  Procedure(s) Performed: LEFT BREAST LUMPECTOMY WITH RADIOACTIVE SEED LOCALIZATION ERAS PATHWAY (Left Breast)     Patient location during evaluation: PACU Anesthesia Type: General Level of consciousness: awake and alert Pain management: pain level controlled Vital Signs Assessment: post-procedure vital signs reviewed and stable Respiratory status: spontaneous breathing, nonlabored ventilation and respiratory function stable Cardiovascular status: blood pressure returned to baseline and stable Postop Assessment: no apparent nausea or vomiting Anesthetic complications: no    Last Vitals:  Vitals:   03/16/17 1623 03/16/17 1700  BP: 131/74 132/72  Pulse: 80 89  Resp: 18   Temp: (!) 36.3 C   SpO2: 98% 97%    Last Pain:  Vitals:   03/16/17 1249  TempSrc: Oral                 Beryle Lathehomas E 

## 2017-03-16 NOTE — Interval H&P Note (Signed)
History and Physical Interval Note:  03/16/2017 12:26 PM  Barbara White  has presented today for surgery, with the diagnosis of left breast mass  The various methods of treatment have been discussed with the patient and family. After consideration of risks, benefits and other options for treatment, the patient has consented to  Procedure(s): LEFT BREAST LUMPECTOMY WITH RADIOACTIVE SEED LOCALIZATION ERAS PATHWAY (Left) as a surgical intervention .  The patient's history has been reviewed, patient examined, no change in status, stable for surgery.  I have reviewed the patient's chart and labs.  Questions were answered to the patient's satisfaction.     [name] 

## 2017-03-16 NOTE — Op Note (Signed)
Patient Name:           Barbara White   Date of Surgery:        03/16/2017  Pre op Diagnosis:      Left breast mass  Post op Diagnosis:    Same  Procedure:                 Left breast lumpectomy with radioactive seed localization and margin assessment  Surgeon:                     Edsel Petrin. Dalbert Batman, M.D., FACS  Assistant:                      OR staff  Operative Indications:   This is a very pleasant 67 year old female from [place]. Her PCP is Dr. Alain Marion.. Her endocrinologist is Dr. Loanne Drilling. She was referred by Dr. Marcelo Baldy at [facility] imaging for a palpable mass at the 12 o'clock position of the left breast      The patient denies any history of trauma or infection in either breast. She's noticed a painless lump in her left breast for a few years. She recently had diagnostic mammograms and ultrasound. There was a 4.2 cm irregular mass with irregular margin in the left breast at 12 o'clock position, 3 cm from the nipple. Highly suggestive of malignancy. Image guided biopsy showed only dense fibrosis and adenosis. Excision was recommended. I agree that this should be excised to clarify the diagnosis     Past history reveals no prior breast problems. Insulin-dependent diabetes. Hyperlipidemia. Hypothyroidism. Appendectomy. Cholecystectomy. She reports TAH and BSO in [place] 6 years ago.  Family history is negative for breast or ovarian cancer       Exam reveals a dominant mass left breast 12 o'clock position. This needs to be excised. She and her sister agree.  She'll be scheduled for left breast lumpectomy with radioactive seed localization.She agrees with this plan.   Operative Findings:       There is a palpable mass in the left breast at the 12:00 position.  3 or 4 cm in diameter.  The original biopsy clip and radioactive seed were contained within this mass.  The tissue felt dense and fibrotic.  We marked all of the margins.  The specimen mammogram looked good showing  the original biopsy clip and the radioactive seed.  Procedure in Detail:          Following the induction of general LMA anesthesia the patient's left breast was prepped and draped in sterile fashion.  Surgical timeout was performed and intravenous antibiotics were given.  0.5% Marcaine with epinephrine was used as local infiltration anesthetic.  Using the neoprobe I planned a curvilinear incision.  The incision was a circumareolar in orientation but was about 2 or 3 cm above the areolar margin.  Incision was made with the knife.  The lumpectomy was performed using the neoprobe and electrocautery.  The specimen was removed and marked with silk sutures and a 6 color ink kit to orient the pathologist.  The specimen mammogram looked very good as described above.  The specimen was sent to the pathology lab.  Hemostasis was excellent.  The wound was irrigated.  5 metal clips were placed to mark the walls of the lumpectomy cavity.  The lumpectomy cavity was closed with multiple layers of 3-0 Vicryl and the skin closed with a running subcuticular 4-0 Monocryl and Dermabond.  Breast binder was placed  and the patient taken to PACU in stable condition.  EBL 10-20 mL.  Counts correct.  Complications none.    Addendum: I logged onto the [employer] and reviewed her prescription medication history      M. Dalbert Batman, M.D., FACS General and Minimally Invasive Surgery Breast and Colorectal Surgery  03/16/2017 3:16 PM

## 2017-03-16 NOTE — Anesthesia Preprocedure Evaluation (Addendum)
Anesthesia Evaluation  Patient identified by MRN, date of birth, ID band Patient awake    Reviewed: Allergy & Precautions, NPO status , Patient's Chart, lab work & pertinent test results  Airway Mallampati: II  TM Distance: >3 FB Neck ROM: Full    Dental  (+) Dental Advisory Given, Loose,    Pulmonary neg pulmonary ROS,    Pulmonary exam normal breath sounds clear to auscultation       Cardiovascular hypertension, Pt. on medications Normal cardiovascular exam Rhythm:Regular Rate:Normal     Neuro/Psych  Headaches, negative psych ROS   GI/Hepatic Neg liver ROS, GERD  Controlled and Medicated,  Endo/Other  diabetes, Insulin DependentHypothyroidism   Renal/GU negative Renal ROS  negative genitourinary   Musculoskeletal  (+) Arthritis ,   Abdominal   Peds  Hematology negative hematology ROS (+)   Anesthesia Other Findings   Reproductive/Obstetrics                            Anesthesia Physical Anesthesia Plan  ASA: II  Anesthesia Plan: General   Post-op Pain Management:    Induction: Intravenous  PONV Risk Score and Plan: 3 and Treatment may vary due to age or medical condition, Ondansetron, Dexamethasone and Scopolamine patch - Pre-op  Airway Management Planned: LMA  Additional Equipment: None  Intra-op Plan:   Post-operative Plan: Extubation in OR  Informed Consent: I have reviewed the patients History and Physical, chart, labs and discussed the procedure including the risks, benefits and alternatives for the proposed anesthesia with the patient or authorized representative who has indicated his/her understanding and acceptance.   Dental advisory given  Plan Discussed with: CRNA  Anesthesia Plan Comments:         Anesthesia Quick Evaluation

## 2017-03-17 ENCOUNTER — Encounter (HOSPITAL_COMMUNITY): Payer: Self-pay | Admitting: General Surgery

## 2017-04-10 ENCOUNTER — Other Ambulatory Visit (INDEPENDENT_AMBULATORY_CARE_PROVIDER_SITE_OTHER): Payer: Medicare Other

## 2017-04-10 DIAGNOSIS — I10 Essential (primary) hypertension: Secondary | ICD-10-CM | POA: Diagnosis not present

## 2017-04-10 DIAGNOSIS — N63 Unspecified lump in unspecified breast: Secondary | ICD-10-CM

## 2017-04-10 DIAGNOSIS — E039 Hypothyroidism, unspecified: Secondary | ICD-10-CM

## 2017-04-10 DIAGNOSIS — E785 Hyperlipidemia, unspecified: Secondary | ICD-10-CM

## 2017-04-10 LAB — BASIC METABOLIC PANEL
BUN: 21 mg/dL (ref 6–23)
CALCIUM: 9.5 mg/dL (ref 8.4–10.5)
CO2: 29 meq/L (ref 19–32)
Chloride: 101 mEq/L (ref 96–112)
Creatinine, Ser: 1.02 mg/dL (ref 0.40–1.20)
GFR: 57.5 mL/min — AB (ref >60.00)
Glucose, Bld: 121 mg/dL — ABNORMAL HIGH (ref 70–99)
POTASSIUM: 4.5 meq/L (ref 3.5–5.1)
Sodium: 137 mEq/L (ref 135–145)

## 2017-04-10 LAB — MICROALBUMIN / CREATININE URINE RATIO
CREATININE, U: 79.3 mg/dL
Microalb Creat Ratio: 0.9 mg/g (ref 0.0–30.0)

## 2017-04-10 LAB — HEPATIC FUNCTION PANEL
ALBUMIN: 4.2 g/dL (ref 3.5–5.2)
ALT: 13 U/L (ref 0–35)
AST: 17 U/L (ref 0–37)
Alkaline Phosphatase: 39 U/L (ref 39–117)
Bilirubin, Direct: 0 mg/dL (ref 0.0–0.3)
Total Bilirubin: 0.4 mg/dL (ref 0.2–1.2)
Total Protein: 7.1 g/dL (ref 6.0–8.3)

## 2017-04-10 LAB — URINALYSIS
Bilirubin Urine: NEGATIVE
Hgb urine dipstick: NEGATIVE
Ketones, ur: NEGATIVE
Leukocytes, UA: NEGATIVE
Nitrite: NEGATIVE
Specific Gravity, Urine: 1.02 (ref 1.000–1.030)
TOTAL PROTEIN, URINE-UPE24: NEGATIVE
UROBILINOGEN UA: 0.2 (ref 0.0–1.0)
pH: 5.5 (ref 5.0–8.0)

## 2017-04-10 LAB — TSH: TSH: 0.99 u[IU]/mL (ref 0.35–4.50)

## 2017-04-10 LAB — LIPID PANEL
Cholesterol: 149 mg/dL (ref 0–200)
HDL: 38.2 mg/dL — AB (ref >39.00)
LDL Cholesterol: 81 mg/dL (ref 0–99)
NONHDL: 110.99
Total CHOL/HDL Ratio: 4
Triglycerides: 152 mg/dL — ABNORMAL HIGH (ref 0.0–149.0)
VLDL: 30.4 mg/dL (ref 0.0–40.0)

## 2017-05-16 ENCOUNTER — Telehealth: Payer: Self-pay | Admitting: Internal Medicine

## 2017-05-16 NOTE — Telephone Encounter (Signed)
Copied from CRM [id number]. Topic: Quick Communication - Rx Refill/Question >> May 16, 2017 11:01 AM Eston Mouldavis,  B wrote:  Pt will be out in 2-3 days  Medication: Insulin Detemir (LEVEMIR FLEXPEN) 100 UNIT/ML Pen   Has the patient contacted their pharmacy? No   (Agent: If no, request that the patient contact the pharmacy for the refill.)  Preferred Pharm[facility] (with phone number or street name[facility] [place] [place] - [address] [phone] (Phone) [phone] (Fax)      Agent: Please be advised that RX refills may take up to 3 business days. We ask that you follow-up with your pharmacy.

## 2017-05-17 ENCOUNTER — Telehealth: Payer: Self-pay | Admitting: Endocrinology

## 2017-05-17 ENCOUNTER — Other Ambulatory Visit: Payer: Self-pay

## 2017-05-17 MED ORDER — INSULIN DETEMIR 100 UNIT/ML FLEXPEN: PEN_INJECTOR | SUBCUTANEOUS | 11 refills | Status: DC

## 2017-05-17 NOTE — Telephone Encounter (Signed)
Noted last rx for Levemir Flexpen 100 units/ ml. Was written by Dr. Everardo AllEllison on 02/16/17; qty. 10 pens, and 11 refills.  [facility]; was advised the last prescription they rec'd was on 01/15/17; their records don't show the latest Rx written in 01/2017 with dosage increase.  Call placed to pt.;  was advised to contact Dr. George HughEllison's office for new Rx.  Pt. Verb. Understanding.

## 2017-05-17 NOTE — Telephone Encounter (Signed)
I called patient's sister & she stated that patient wasn't receiving the correct number of pens when prescription was picked up. [facility] & they stated that the last prescription was never received. I have resent prescription to them electronically because it appears that initially it was printed off. I called patient's sister again & made her aware of this.

## 2017-05-17 NOTE — Telephone Encounter (Signed)
Insulin Detemir (LEVEMIR FLEXPEN) 100 UNIT/ML Pen  Patient sister needs some information about the medication pt is taking. She states that she only receives 5 in the box when she gets the prescription instead of 10.   please advise

## 2017-05-22 ENCOUNTER — Encounter: Payer: Self-pay | Admitting: Endocrinology

## 2017-05-22 ENCOUNTER — Ambulatory Visit (INDEPENDENT_AMBULATORY_CARE_PROVIDER_SITE_OTHER): Payer: Medicare Other | Admitting: Endocrinology

## 2017-05-22 VITALS — BP 110/68 | HR 81 | Wt 143.0 lb

## 2017-05-22 DIAGNOSIS — E118 Type 2 diabetes mellitus with unspecified complications: Secondary | ICD-10-CM | POA: Diagnosis not present

## 2017-05-22 DIAGNOSIS — Z794 Long term (current) use of insulin: Secondary | ICD-10-CM

## 2017-05-22 DIAGNOSIS — E1165 Type 2 diabetes mellitus with hyperglycemia: Secondary | ICD-10-CM

## 2017-05-22 LAB — POCT GLYCOSYLATED HEMOGLOBIN (HGB A1C): Hemoglobin A1C: 7.8

## 2017-05-22 MED ORDER — INSULIN DETEMIR 100 UNIT/ML FLEXPEN: PEN_INJECTOR | SUBCUTANEOUS | 11 refills | Status: DC

## 2017-05-22 MED ORDER — BLOOD GLUCOSE MONITOR KIT: PACK | 0 refills | Status: DC

## 2017-05-22 NOTE — Progress Notes (Signed)
Subjective:    Patient ID: Barbara White, female    DOB: 17-Oct-1950, 67 y.o.   MRN: [id number]  HPI Pt returns for f/u of diabetes mellitus: DM type: Insulin-requiring type 2 Dx'ed: 1998 Complications: polyneuropathy Therapy: insulin since 2003, and 2 oral meds GDM: 1976 DKA: never Severe hypoglycemia: never Pancreatitis: never Pancreatic imaging: normal on 2017 CT.  Other: she declines multiple daily injections. Interval history: she brings a record of her cbg's which I have reviewed today.  It varies from 95-190.  It is in general higher as the day goes on.  She requests instructions for the upcoming Ramadan month.  She usually loses a few lbs over the Ramadan month.  Past Medical History:  Diagnosis Date  . Arthritis   . Chicken pox 1967  . Diabetes mellitus without complication (HCC)   . GERD (gastroesophageal reflux disease)   . Headache   . Hyperlipidemia   . Hypertension   . Mass of left breast   . Polyneuropathy   . Thyroid disease     Past Surgical History:  Procedure Laterality Date  . ABDOMINAL HYSTERECTOMY  05/2010  . APPENDECTOMY  1984  . BREAST LUMPECTOMY WITH RADIOACTIVE SEED LOCALIZATION Left 03/16/2017   Procedure: LEFT BREAST LUMPECTOMY WITH RADIOACTIVE SEED LOCALIZATION ERAS PATHWAY;  Surgeon: Claud Kelp, MD;  Location[facility] OR;  Service: General;  Laterality: Left;  . CATARACT EXTRACTION W/ INTRAOCULAR LENS  IMPLANT, BILATERAL    . CHOLECYSTECTOMY  2011    Social History   Socioeconomic History  . Marital status: Widowed    Spouse name: Not on file  . Number of children: Not on file  . Years of education: Not on file  . Highest education level: Not on file  Occupational History  . Not on file  Social Needs  . Financial resource strain: Not on file  . Food insecurity:    Worry: Not on file    Inability: Not on file  . Transportation needs:    Medical: Not on file    Non-medical: Not on file  Tobacco Use  . Smoking status: Never Smoker   . Smokeless tobacco: Never Used  Substance and Sexual Activity  . Alcohol use: No  . Drug use: No  . Sexual activity: Not Currently  Lifestyle  . Physical activity:    Days per week: Not on file    Minutes per session: Not on file  . Stress: Not on file  Relationships  . Social connections:    Talks on phone: Not on file    Gets together: Not on file    Attends religious service: Not on file    Active member of club or organization: Not on file    Attends meetings of clubs or organizations: Not on file    Relationship status: Not on file  . Intimate partner violence:    Fear of current or ex partner: Not on file    Emotionally abused: Not on file    Physically abused: Not on file    Forced sexual activity: Not on file  Other Topics Concern  . Not on file  Social History Narrative  . Not on file    Current Outpatient Medications on File Prior to Visit  Medication Sig Dispense Refill  . aspirin 81 MG chewable tablet Chew 1 tablet (81 mg total) by mouth daily. 90 tablet 3  . Cholecalciferol (VITAMIN D3) 2000 units capsule Take 1 capsule (2,000 Units total) daily by mouth. 100 capsule 3  .  diltiazem (CARDIZEM) 30 MG tablet Take 1 tablet (30 mg total) by mouth 2 (two) times daily. 180 tablet 3  . Empagliflozin-Metformin HCl (SYNJARDY) 05-998 MG TABS Take 1 tablet by mouth 2 (two) times daily. 180 tablet 3  . levothyroxine (SYNTHROID, LEVOTHROID) 50 MCG tablet Take 1 tablet (50 mcg total) by mouth daily. 90 tablet 3  . losartan (COZAAR) 50 MG tablet Take 1 tablet (50 mg total) by mouth daily. 90 tablet 3  . Multiple Vitamins-Minerals (ICAPS AREDS 2 PO) Take 1 capsule by mouth 2 (two) times daily.    . simvastatin (ZOCOR) 20 MG tablet TAKE ONE TABLET BY MOUTH DAILY AT 6:00PM 90 tablet 3   No current facility-administered medications on file prior to visit.     Allergies  Allergen Reactions  . Penicillins Swelling and Other (See Comments)    Body swollen, pt collapsed Has  patient had a PCN reaction causing immediate rash, facial/tongue/throat swelling, SOB or lightheadedness with hypotension:  Has patient had a PCN reaction causing severe rash involving mucus membranes or skin necrosis:  Has patient had a PCN reaction that required hospitalization:  Has patient had a PCN reaction occurring within the last 10 years:  If all of the above answers are "NO", then may proceed with Cephalosporin use.   . Sulfa Antibiotics Itching    Family History  Problem Relation Age of Onset  . Stroke Father   . Diabetes Father   . Heart disease Father 69       with MI  . Hypertension Father   . Hyperlipidemia Father     BP 110/68   Pulse 81   Wt 143 lb (64.9 kg)   SpO2 95%   BMI 26.16 kg/m    Review of Systems she denies hypoglycemia    Objective:   Physical Exam VITAL SIGNS:  See vs page GENERAL: no distress Pulses: foot pulses are intact bilaterally.   MSK: no deformity of the feet or ankles.  CV: no edema of the legs or ankles Skin:  no ulcer on the feet or ankles.  normal color and temp on the feet and ankles Neuro: sensation is intact to touch on the feet and ankles.     Lab Results  Component Value Date   HGBA1C 7.8 05/22/2017       Assessment & Plan:  Insulin-requiring type 2 DM, with polyneuropathy.  Based on the pattern of her cbg's, she needs some adjustment in her therapy.     Patient Instructions  check your blood sugar twice a day.  vary the time of day when you check, between before the 3 meals, and at bedtime.  also check if you have symptoms of your blood sugar being too high or too low.  please keep a record of the readings and bring it to your next appointment here (or you can bring the meter itself).  You can write it on any piece of paper.  please call us sooner if your blood sugar goes below 70, or if you have a lot of readings over 200.   For now, please:  Change levemir to 40 units each morning and 10 units each evening Please  continue the same "synjardy." For the upcoming Ramadan month, take 15 units, twice a day (and the same "Synjardy").  Also, make sure to check the blood sugar during the day, and call if you are having lows.  Also, it is good to have glucose pills, incase of emergency.   Please  come back for a follow-up appointment in 3-4 months, or when you return from [place].

## 2017-05-22 NOTE — Patient Instructions (Addendum)
check your blood sugar twice a day.  vary the time of day when you check, between before the 3 meals, and at bedtime.  also check if you have symptoms of your blood sugar being too high or too low.  please keep a record of the readings and bring it to your next appointment here (or you can bring the meter itself).  You can write it on any piece of paper.  please call us sooner if your blood sugar goes below 70, or if you have a lot of readings over 200.   For now, please:  Change levemir to 40 units each morning and 10 units each evening Please continue the same "synjardy." For the upcoming Ramadan month, take 15 units, twice a day (and the same "Synjardy").  Also, make sure to check the blood sugar during the day, and call if you are having lows.  Also, it is good to have glucose pills, incase of emergency.   Please come back for a follow-up appointment in 3-4 months, or when you return from [place].

## 2017-06-18 ENCOUNTER — Encounter: Payer: Self-pay | Admitting: Endocrinology

## 2017-06-19 ENCOUNTER — Other Ambulatory Visit: Payer: Self-pay

## 2017-06-19 MED ORDER — INSULIN DETEMIR 100 UNIT/ML FLEXPEN: PEN_INJECTOR | SUBCUTANEOUS | 11 refills | Status: DC

## 2017-07-02 ENCOUNTER — Ambulatory Visit: Payer: Medicare Other | Admitting: Internal Medicine

## 2017-07-11 ENCOUNTER — Ambulatory Visit: Payer: Medicare Other | Admitting: Internal Medicine

## 2017-08-31 ENCOUNTER — Ambulatory Visit: Payer: Medicare Other | Admitting: Internal Medicine

## 2017-09-14 ENCOUNTER — Other Ambulatory Visit: Payer: Self-pay | Admitting: Internal Medicine

## 2017-09-27 ENCOUNTER — Ambulatory Visit: Payer: Medicare Other | Admitting: Endocrinology

## 2017-10-01 ENCOUNTER — Ambulatory Visit (INDEPENDENT_AMBULATORY_CARE_PROVIDER_SITE_OTHER): Payer: Medicare Other | Admitting: Internal Medicine

## 2017-10-01 ENCOUNTER — Encounter: Payer: Self-pay | Admitting: Internal Medicine

## 2017-10-01 ENCOUNTER — Other Ambulatory Visit (INDEPENDENT_AMBULATORY_CARE_PROVIDER_SITE_OTHER): Payer: Medicare Other

## 2017-10-01 VITALS — BP 112/66 | HR 73 | Temp 98.3°F | Ht 62.0 in | Wt 141.0 lb

## 2017-10-01 DIAGNOSIS — K219 Gastro-esophageal reflux disease without esophagitis: Secondary | ICD-10-CM | POA: Diagnosis not present

## 2017-10-01 DIAGNOSIS — E034 Atrophy of thyroid (acquired): Secondary | ICD-10-CM

## 2017-10-01 DIAGNOSIS — E785 Hyperlipidemia, unspecified: Secondary | ICD-10-CM

## 2017-10-01 DIAGNOSIS — E1142 Type 2 diabetes mellitus with diabetic polyneuropathy: Secondary | ICD-10-CM

## 2017-10-01 DIAGNOSIS — Z794 Long term (current) use of insulin: Secondary | ICD-10-CM

## 2017-10-01 DIAGNOSIS — K0889 Other specified disorders of teeth and supporting structures: Secondary | ICD-10-CM

## 2017-10-01 DIAGNOSIS — Z23 Encounter for immunization: Secondary | ICD-10-CM

## 2017-10-01 LAB — CBC WITH DIFFERENTIAL/PLATELET
BASOS ABS: 0.1 10*3/uL (ref 0.0–0.1)
BASOS PCT: 0.9 % (ref 0.0–3.0)
Eosinophils Absolute: 0.2 10*3/uL (ref 0.0–0.7)
Eosinophils Relative: 3.3 % (ref 0.0–5.0)
HEMATOCRIT: 38.6 % (ref 36.0–46.0)
Hemoglobin: 12.6 g/dL (ref 12.0–15.0)
LYMPHS PCT: 39.6 % (ref 12.0–46.0)
Lymphs Abs: 2.4 10*3/uL (ref 0.7–4.0)
MCHC: 32.7 g/dL (ref 30.0–36.0)
MCV: 79.4 fl (ref 78.0–100.0)
MONOS PCT: 8.1 % (ref 3.0–12.0)
Monocytes Absolute: 0.5 10*3/uL (ref 0.1–1.0)
NEUTROS ABS: 2.9 10*3/uL (ref 1.4–7.7)
Neutrophils Relative %: 48.1 % (ref 43.0–77.0)
PLATELETS: 298 10*3/uL (ref 150.0–400.0)
RBC: 4.87 Mil/uL (ref 3.87–5.11)
RDW: 14.6 % (ref 11.5–15.5)
WBC: 6.1 10*3/uL (ref 4.0–10.5)

## 2017-10-01 LAB — BASIC METABOLIC PANEL
BUN: 20 mg/dL (ref 6–23)
CO2: 28 meq/L (ref 19–32)
Calcium: 9.5 mg/dL (ref 8.4–10.5)
Chloride: 101 mEq/L (ref 96–112)
Creatinine, Ser: 1.11 mg/dL (ref 0.40–1.20)
GFR: 52.08 mL/min — ABNORMAL LOW (ref >60.00)
Glucose, Bld: 106 mg/dL — ABNORMAL HIGH (ref 70–99)
Potassium: 4.1 mEq/L (ref 3.5–5.1)
SODIUM: 136 meq/L (ref 135–145)

## 2017-10-01 LAB — HEMOGLOBIN A1C: HEMOGLOBIN A1C: 8.7 % — AB (ref 4.6–6.5)

## 2017-10-01 LAB — TSH: TSH: 1.1 u[IU]/mL (ref 0.35–4.50)

## 2017-10-01 LAB — HEPATIC FUNCTION PANEL
ALK PHOS: 49 U/L (ref 39–117)
ALT: 12 U/L (ref 0–35)
AST: 13 U/L (ref 0–37)
Albumin: 4.2 g/dL (ref 3.5–5.2)
BILIRUBIN DIRECT: 0.1 mg/dL (ref 0.0–0.3)
TOTAL PROTEIN: 7.6 g/dL (ref 6.0–8.3)
Total Bilirubin: 0.4 mg/dL (ref 0.2–1.2)

## 2017-10-01 MED ORDER — DOXYCYCLINE HYCLATE 100 MG PO TABS: 100.0000 mg | ORAL_TABLET | Freq: Two times a day (BID) | ORAL | 0 refills | Status: DC

## 2017-10-01 MED ORDER — OMEPRAZOLE 20 MG PO CPDR: 20.0000 mg | DELAYED_RELEASE_CAPSULE | Freq: Every day | ORAL | 3 refills | Status: DC

## 2017-10-01 MED ORDER — SIMVASTATIN 20 MG PO TABS: ORAL_TABLET | ORAL | 3 refills | Status: DC

## 2017-10-01 NOTE — Assessment & Plan Note (Signed)
TSH 

## 2017-10-01 NOTE — Assessment & Plan Note (Signed)
R upper incisor Doxy x 10 d Call [facility] in [place]

## 2017-10-01 NOTE — Progress Notes (Signed)
Subjective:  Patient ID: [name], female    DOB: 1950/02/02  Age: 67 y.o. MRN: [id number]  CC: No chief complaint on file.   HPI Barbara White presents for R toothache F/u DM, HTN   Outpatient Medications Prior to Visit  Medication Sig Dispense Refill  . aspirin 81 MG chewable tablet Chew 1 tablet (81 mg total) by mouth daily. 90 tablet 3  . blood glucose meter kit and supplies KIT Dispense based on patient and insurance preference. Use up to four times daily as directed. (FOR ICD-9 250.00, 250.01). 1 each 0  . Cholecalciferol (VITAMIN D3) 2000 units capsule Take 1 capsule (2,000 Units total) daily by mouth. 100 capsule 3  . diltiazem (CARDIZEM) 30 MG tablet Take 1 tablet (30 mg total) by mouth 2 (two) times daily. 180 tablet 3  . Insulin Detemir (LEVEMIR FLEXPEN) 100 UNIT/ML Pen 40 units each morning and 10 units each evening, and pen needles 2/day 90 pen 11  . levothyroxine (SYNTHROID, LEVOTHROID) 50 MCG tablet Take 1 tablet (50 mcg total) by mouth daily. 90 tablet 3  . Multiple Vitamins-Minerals (ICAPS AREDS 2 PO) Take 1 capsule by mouth 2 (two) times daily.    . simvastatin (ZOCOR) 20 MG tablet TAKE ONE TABLET BY MOUTH DAILY AT 6:00PM 90 tablet 3  . SYNJARDY 05-998 MG TABS TAKE ONE TABLET BY MOUTH TWICE A DAY 180 tablet 2  . losartan (COZAAR) 50 MG tablet Take 1 tablet (50 mg total) by mouth daily. 90 tablet 3   No facility-administered medications prior to visit.     ROS: Review of Systems  Constitutional: Negative for activity change, appetite change, chills, fatigue and unexpected weight change.  HENT: Negative for congestion, mouth sores and sinus pressure.   Eyes: Negative for visual disturbance.  Respiratory: Negative for cough and chest tightness.   Gastrointestinal: Negative for abdominal pain and nausea.  Genitourinary: Negative for difficulty urinating, frequency and vaginal pain.  Musculoskeletal: Negative for back pain and gait problem.  Skin: Negative for  pallor and rash.  Neurological: Negative for dizziness, tremors, weakness, numbness and headaches.  Psychiatric/Behavioral: Negative for confusion and sleep disturbance.    Objective:  BP 112/66 (BP Location: Left Arm, Patient Position: Sitting, Cuff Size: Normal)   Pulse 73   Temp 98.3 F (36.8 C) (Oral)   Ht 5' 2"  (1.575 m)   Wt 141 lb (64 kg)   SpO2 97%   BMI 25.79 kg/m   BP Readings from Last 3 Encounters:  10/01/17 112/66  05/22/17 110/68  03/16/17 132/72    Wt Readings from Last 3 Encounters:  10/01/17 141 lb (64 kg)  05/22/17 143 lb (64.9 kg)  03/16/17 142 lb (64.4 kg)    Physical Exam  Constitutional: She appears well-developed. No distress.  HENT:  Head: Normocephalic.  Right Ear: External ear normal.  Left Ear: External ear normal.  Nose: Nose normal.  Mouth/Throat: Oropharynx is clear and moist.  Eyes: Pupils are equal, round, and reactive to light. Conjunctivae are normal. Right eye exhibits no discharge. Left eye exhibits no discharge.  Neck: Normal range of motion. Neck supple. No JVD present. No tracheal deviation present. No thyromegaly present.  Cardiovascular: Normal rate, regular rhythm and normal heart sounds.  Pulmonary/Chest: No stridor. No respiratory distress. She has no wheezes.  Abdominal: Soft. Bowel sounds are normal. She exhibits no distension and no mass. There is no tenderness. There is no rebound and no guarding.  Musculoskeletal: She exhibits no edema or tenderness.  Lymphadenopathy:    She has no cervical adenopathy.  Neurological: She displays normal reflexes. No cranial nerve deficit. She exhibits normal muscle tone. Coordination normal.  Skin: No rash noted. No erythema.  Psychiatric: She has a normal mood and affect. Her behavior is normal. Judgment and thought content normal.   R upper incisor tender/caries   Lab Results  Component Value Date   WBC 6.9 03/16/2017   HGB 12.6 03/16/2017   HCT 40.3 03/16/2017   PLT 238  03/16/2017   GLUCOSE 121 (H) 04/10/2017   CHOL 149 04/10/2017   TRIG 152.0 (H) 04/10/2017   HDL 38.20 (L) 04/10/2017   LDLCALC 81 04/10/2017   ALT 13 04/10/2017   AST 17 04/10/2017   NA 137 04/10/2017   K 4.5 04/10/2017   CL 101 04/10/2017   CREATININE 1.02 04/10/2017   BUN 21 04/10/2017   CO2 29 04/10/2017   TSH 0.99 04/10/2017   HGBA1C 7.8 05/22/2017   MICROALBUR <0.7 04/10/2017    No results found.  Assessment & Plan:   Diagnoses and all orders for this visit:  Need for influenza vaccination -     Flu vaccine HIGH DOSE PF (Fluzone High dose)     No orders of the defined types were placed in this encounter.    Follow-up: No follow-ups on file.  Walker Kehr, MD

## 2017-10-01 NOTE — Assessment & Plan Note (Signed)
LABS F/U w/Dr Everardo All

## 2017-10-01 NOTE — Patient Instructions (Signed)
Call [facility] in [place]

## 2017-10-11 ENCOUNTER — Ambulatory Visit (INDEPENDENT_AMBULATORY_CARE_PROVIDER_SITE_OTHER): Payer: Medicare Other | Admitting: Endocrinology

## 2017-10-11 ENCOUNTER — Encounter: Payer: Self-pay | Admitting: Endocrinology

## 2017-10-11 VITALS — BP 110/72 | HR 79 | Ht 62.0 in | Wt 143.0 lb

## 2017-10-11 DIAGNOSIS — Z794 Long term (current) use of insulin: Secondary | ICD-10-CM

## 2017-10-11 DIAGNOSIS — E1142 Type 2 diabetes mellitus with diabetic polyneuropathy: Secondary | ICD-10-CM | POA: Diagnosis not present

## 2017-10-11 MED ORDER — INSULIN DETEMIR 100 UNIT/ML FLEXPEN: PEN_INJECTOR | SUBCUTANEOUS | 3 refills | Status: DC

## 2017-10-11 NOTE — Patient Instructions (Addendum)
check your blood sugar twice a day.  vary the time of day when you check, between before the 3 meals, and at bedtime.  also check if you have symptoms of your blood sugar being too high or too low.  please keep a record of the readings and bring it to your next appointment here (or you can bring the meter itself).  You can write it on any piece of paper.  please call us sooner if your blood sugar goes below 70, or if you have a lot of readings over 200.   please increase the levemir to 45 units each morning and 10 units each evening.  Please continue the same "synjardy."  Please come back for a follow-up appointment in 4 months.      ???? ????? ????? ????? ????? ??????? ????? ????? ???? ????????? ??? ??? ???? ??? ????, 3 ??????? ??? ??? ????? ????? ????? ????? ??????? ?????? ??? ???? ?? ??? ?? ?????? ????? ?????? ???? ??? ??????? ??? ?????? ???? ??? ????? ?????? ????? ??? ??? ????? ??????? ???????????????????? ????? ???? (?? ???? ????? ????? ???? ?????)? ???? ??? ???? ?????? ??????? ????? ?????? ????? ????? ??????? ?????? ??? 70 ?? ???? ??? ???? ??? ?? 200 ??? ???? ????? ????? ???? ??? ?????? ?????????? ?? ????? ???? ??? ????? ????? 45 ????? ??? ??????? ????????? 10 ?????? ??????? ??????? ???? ??? ??? "synjardy" ??????? ???? ???? ??? 4 ???? ???-?? ?????????????????? ???? ???? ?????

## 2017-10-11 NOTE — Progress Notes (Signed)
Subjective:    Patient ID: Barbara White, female    DOB: 07/13/1950, 67 y.o.   MRN: [id number]  HPI Pt returns for f/u of diabetes mellitus: DM type: Insulin-requiring type 2 Dx'ed: 0600 Complications: polyneuropathy Therapy: insulin since 2003, and 2 oral meds GDM: 1976 DKA: never Severe hypoglycemia: never Pancreatitis: never Pancreatic imaging: normal on 2017 CT.  Other: she declines multiple daily injections. Interval history: she brings a record of her cbg's which I have reviewed today.  It varies from 100-200.  It is in general higher as the day goes on. She has mild hypoglycemia approx once per month.  This usually happens fasting.  She takes 35 units QAM, and 15 units QPM.  She will soon go to [place] for 3 months.   Past Medical History:  Diagnosis Date  . Arthritis   . Chicken pox 1967  . Diabetes mellitus without complication ([facility])   . GERD (gastroesophageal reflux disease)   . Headache   . Hyperlipidemia   . Hypertension   . Mass of left breast   . Polyneuropathy   . Thyroid disease     Past Surgical History:  Procedure Laterality Date  . ABDOMINAL HYSTERECTOMY  05/2010  . APPENDECTOMY  1984  . BREAST LUMPECTOMY WITH RADIOACTIVE SEED LOCALIZATION Left 03/16/2017   Procedure: LEFT BREAST LUMPECTOMY WITH RADIOACTIVE SEED LOCALIZATION ERAS PATHWAY;  Surgeon: Fanny Skates, MD;  Location: [facility];  Service: General;  Laterality: Left;  . CATARACT EXTRACTION W/ INTRAOCULAR LENS  IMPLANT, BILATERAL    . CHOLECYSTECTOMY  2011    Social History   Socioeconomic History  . Marital status: Widowed    Spouse name: Not on file  . Number of children: Not on file  . Years of education: Not on file  . Highest education level: Not on file  Occupational History  . Not on file  Social Needs  . Financial resource strain: Not on file  . Food insecurity:    Worry: Not on file    Inability: Not on file  . Transportation needs:    Medical: Not on file    Non-medical: Not  on file  Tobacco Use  . Smoking status: Never Smoker  . Smokeless tobacco: Never Used  Substance and Sexual Activity  . Alcohol use: No  . Drug use: No  . Sexual activity: Not Currently  Lifestyle  . Physical activity:    Days per week: Not on file    Minutes per session: Not on file  . Stress: Not on file  Relationships  . Social connections:    Talks on phone: Not on file    Gets together: Not on file    Attends religious service: Not on file    Active member of club or organization: Not on file    Attends meetings of clubs or organizations: Not on file    Relationship status: Not on file  . Intimate partner violence:    Fear of current or ex partner: Not on file    Emotionally abused: Not on file    Physically abused: Not on file    Forced sexual activity: Not on file  Other Topics Concern  . Not on file  Social History Narrative  . Not on file    Current Outpatient Medications on File Prior to Visit  Medication Sig Dispense Refill  . aspirin 81 MG chewable tablet Chew 1 tablet (81 mg total) by mouth daily. 90 tablet 3  . blood glucose meter kit  and supplies KIT Dispense based on patient and insurance preference. Use up to four times daily as directed. (FOR ICD-9 250.00, 250.01). 1 each 0  . Cholecalciferol (VITAMIN D3) 2000 units capsule Take 1 capsule (2,000 Units total) daily by mouth. 100 capsule 3  . diltiazem (CARDIZEM) 30 MG tablet Take 1 tablet (30 mg total) by mouth 2 (two) times daily. 180 tablet 3  . doxycycline (VIBRA-TABS) 100 MG tablet Take 1 tablet (100 mg total) by mouth 2 (two) times daily. 20 tablet 0  . levothyroxine (SYNTHROID, LEVOTHROID) 50 MCG tablet Take 1 tablet (50 mcg total) by mouth daily. 90 tablet 3  . Multiple Vitamins-Minerals (ICAPS AREDS 2 PO) Take 1 capsule by mouth 2 (two) times daily.    Marland Kitchen omeprazole (PRILOSEC) 20 MG capsule Take 1 capsule (20 mg total) by mouth daily. 90 capsule 3  . simvastatin (ZOCOR) 20 MG tablet TAKE ONE TABLET BY  MOUTH DAILY AT 6:00PM 90 tablet 3  . SYNJARDY 05-998 MG TABS TAKE ONE TABLET BY MOUTH TWICE A DAY 180 tablet 2  . losartan (COZAAR) 50 MG tablet Take 1 tablet (50 mg total) by mouth daily. 90 tablet 3   No current facility-administered medications on file prior to visit.     Allergies  Allergen Reactions  . Penicillins Swelling and Other (See Comments)    Pt collapsed 1972-  Fasting month ?vaso-vagal after blood was taken. No rash Has patient had a PCN reaction causing immediate rash, facial/tongue/throat swelling, SOB or lightheadedness with hypotension:  Has patient had a PCN reaction causing severe rash involving mucus membranes or skin necrosis:  Has patient had a PCN reaction that required hospitalization:  Has patient had a PCN reaction occurring within the last 10 years:  If all of the above answers are "NO", then may proceed with Cephalospor  . Sulfa Antibiotics Itching    Family History  Problem Relation Age of Onset  . Stroke Father   . Diabetes Father   . Heart disease Father 26       with MI  . Hypertension Father   . Hyperlipidemia Father     BP 110/72 (BP Location: Left Arm, Patient Position: Sitting)   Pulse 79   Ht _0  (1.575 m)   Wt 143 lb (64.9 kg)   SpO2 97%   BMI 26.16 kg/m    Review of Systems Denies LOC    Objective:   Physical Exam VITAL SIGNS:  See vs page GENERAL: no distress Pulses: foot pulses are intact bilaterally.   MSK: no deformity of the feet or ankles.  CV: no edema of the legs or ankles Skin:  no ulcer on the feet or ankles.  normal color and temp on the feet and ankles Neuro: sensation is intact to touch on the feet and ankles.     Lab Results  Component Value Date   HGBA1C 8.7 (H) 10/01/2017      Assessment & Plan:  Insulin-requiring type 2 DM, with polyneuropathy: worse Hypoglycemia: based on the pattern of her cbg's, she needs some adjustment in her therapy  Patient Instructions  check your blood sugar twice a day.   vary the time of day when you check, between before the 3 meals, and at bedtime.  also check if you have symptoms of your blood sugar being too high or too low.  please keep a record of the readings and bring it to your next appointment here (or you can bring the meter itself).  You can write it on any piece of paper.  please call us sooner if your blood sugar goes below 70, or if you have a lot of readings over 200.   please increase the levemir to 45 units each morning and 10 units each evening.  Please continue the same "synjardy."  Please come back for a follow-up appointment in 4 months.      ???? ????? ????? ????? ????? ??????? ????? ????? ???? ????????? ??? ??? ???? ??? ????, 3 ??????? ??? ??? ????? ????? ????? ????? ??????? ?????? ??? ???? ?? ??? ?? ?????? ????? ?????? ???? ??? ??????? ??? ?????? ???? ??? ????? ?????? ????? ??? ??? ????? ??????? ???????????????????? ????? ???? (?? ???? ????? ????? ???? ?????)? ???? ??? ???? ?????? ??????? ????? ?????? ????? ????? ??????? ?????? ??? 70 ?? ???? ??? ???? ??? ?? 200 ??? ???? ????? ????? ???? ??? ?????? ?????????? ?? ????? ???? ??? ????? ????? 45 ????? ??? ??????? ????????? 10 ?????? ??????? ??????? ???? ??? ??? "synjardy" ??????? ???? ???? ??? 4 ???? ???-?? ?????????????????? ???? ???? ?????

## 2018-01-28 ENCOUNTER — Ambulatory Visit: Payer: Medicare Other | Admitting: Internal Medicine

## 2018-02-07 ENCOUNTER — Encounter: Payer: Self-pay | Admitting: Internal Medicine

## 2018-02-07 ENCOUNTER — Ambulatory Visit (INDEPENDENT_AMBULATORY_CARE_PROVIDER_SITE_OTHER): Payer: Medicare Other | Admitting: Internal Medicine

## 2018-02-07 ENCOUNTER — Other Ambulatory Visit (INDEPENDENT_AMBULATORY_CARE_PROVIDER_SITE_OTHER): Payer: Medicare Other

## 2018-02-07 VITALS — BP 118/74 | HR 83 | Temp 97.9°F | Ht 62.0 in | Wt 141.0 lb

## 2018-02-07 DIAGNOSIS — E1142 Type 2 diabetes mellitus with diabetic polyneuropathy: Secondary | ICD-10-CM | POA: Diagnosis not present

## 2018-02-07 DIAGNOSIS — Z794 Long term (current) use of insulin: Secondary | ICD-10-CM | POA: Diagnosis not present

## 2018-02-07 DIAGNOSIS — K219 Gastro-esophageal reflux disease without esophagitis: Secondary | ICD-10-CM | POA: Diagnosis not present

## 2018-02-07 DIAGNOSIS — R202 Paresthesia of skin: Secondary | ICD-10-CM | POA: Diagnosis not present

## 2018-02-07 DIAGNOSIS — E785 Hyperlipidemia, unspecified: Secondary | ICD-10-CM | POA: Diagnosis not present

## 2018-02-07 DIAGNOSIS — R2681 Unsteadiness on feet: Secondary | ICD-10-CM

## 2018-02-07 DIAGNOSIS — E039 Hypothyroidism, unspecified: Secondary | ICD-10-CM | POA: Diagnosis not present

## 2018-02-07 DIAGNOSIS — I10 Essential (primary) hypertension: Secondary | ICD-10-CM | POA: Diagnosis not present

## 2018-02-07 LAB — BASIC METABOLIC PANEL
BUN: 19 mg/dL (ref 6–23)
CALCIUM: 9.5 mg/dL (ref 8.4–10.5)
CO2: 26 mEq/L (ref 19–32)
CREATININE: 1.13 mg/dL (ref 0.40–1.20)
Chloride: 101 mEq/L (ref 96–112)
GFR: 47.95 mL/min — AB (ref >60.00)
GLUCOSE: 144 mg/dL — AB (ref 70–99)
Potassium: 4.2 mEq/L (ref 3.5–5.1)
SODIUM: 135 meq/L (ref 135–145)

## 2018-02-07 LAB — HEMOGLOBIN A1C: HEMOGLOBIN A1C: 8.2 % — AB (ref 4.6–6.5)

## 2018-02-07 MED ORDER — INSULIN DETEMIR 100 UNIT/ML FLEXPEN: PEN_INJECTOR | SUBCUTANEOUS | 3 refills | Status: DC

## 2018-02-07 MED ORDER — EMPAGLIFLOZIN-METFORMIN HCL 5-1000 MG PO TABS: 1.0000 | ORAL_TABLET | Freq: Two times a day (BID) | ORAL | 2 refills | Status: DC

## 2018-02-07 MED ORDER — SIMVASTATIN 20 MG PO TABS: ORAL_TABLET | ORAL | 3 refills | Status: DC

## 2018-02-07 MED ORDER — LEVOTHYROXINE SODIUM 50 MCG PO TABS: 50.0000 ug | ORAL_TABLET | Freq: Every day | ORAL | 3 refills | Status: DC

## 2018-02-07 MED ORDER — LOSARTAN POTASSIUM 50 MG PO TABS: 50.0000 mg | ORAL_TABLET | Freq: Every day | ORAL | 3 refills | Status: DC

## 2018-02-07 MED ORDER — OMEPRAZOLE 20 MG PO CPDR: 20.0000 mg | DELAYED_RELEASE_CAPSULE | Freq: Every day | ORAL | 3 refills | Status: DC

## 2018-02-07 NOTE — Assessment & Plan Note (Signed)
Diltiazem Losartan 

## 2018-02-07 NOTE — Patient Instructions (Signed)
If you have medicare related insurance (such as traditional Medicare, Blue Cross Medicare, United HealthCare Medicare, or similar), Please make an appointment at the scheduling desk with Jill, the Wellness Health Coach, for your Wellness visit in this office, which is a benefit with your insurance.   Cardiac CT calcium scoring test $150   Computed tomography, more commonly known as a CT or CAT scan, is a diagnostic medical imaging test. Like traditional x-rays, it produces multiple images or pictures of the inside of the body. The cross-sectional images generated during a CT scan can be reformatted in multiple planes. They can even generate three-dimensional images. These images can be viewed on a computer monitor, printed on film or by a 3D printer, or transferred to a CD or DVD. CT images of internal organs, bones, soft tissue and blood vessels provide greater detail than traditional x-rays, particularly of soft tissues and blood vessels. A cardiac CT scan for coronary calcium is a non-invasive way of obtaining information about the presence, location and extent of calcified plaque in the coronary arteries-the vessels that supply oxygen-containing blood to the heart muscle. Calcified plaque results when there is a build-up of fat and other substances under the inner layer of the artery. This material can calcify which signals the presence of atherosclerosis, a disease of the vessel wall, also called coronary artery disease (CAD). People with this disease have an increased risk for heart attacks. In addition, over time, progression of plaque build up (CAD) can narrow the arteries or even close off blood flow to the heart. The result may be chest pain, sometimes called "angina," or a heart attack. Because calcium is a marker of CAD, the amount of calcium detected on a cardiac CT scan is a helpful prognostic tool. The findings on cardiac CT are expressed as a calcium score. Another name for this test is  coronary artery calcium scoring.  What are some common uses of the procedure? The goal of cardiac CT scan for calcium scoring is to determine if CAD is present and to what extent, even if there are no symptoms. It is a screening study that may be recommended by a physician for patients with risk factors for CAD but no clinical symptoms. The major risk factors for CAD are: . high blood cholesterol levels  . family history of heart attacks  . diabetes  . high blood pressure  . cigarette smoking  . overweight or obese  . physical inactivity   A negative cardiac CT scan for calcium scoring shows no calcification within the coronary arteries. This suggests that CAD is absent or so minimal it cannot be seen by this technique. The chance of having a heart attack over the next two to five years is very low under these circumstances. A positive test means that CAD is present, regardless of whether or not the patient is experiencing any symptoms. The amount of calcification-expressed as the calcium score-may help to predict the likelihood of a myocardial infarction (heart attack) in the coming years and helps your medical doctor or cardiologist decide whether the patient may need to take preventive medicine or undertake other measures such as diet and exercise to lower the risk for heart attack. The extent of CAD is graded according to your calcium score:  Calcium Score  Presence of CAD  0 No evidence of CAD   1-10 Minimal evidence of CAD  11-100 Mild evidence of CAD  101-400 Moderate evidence of CAD  Over 400 Extensive evidence of   CAD     

## 2018-02-07 NOTE — Progress Notes (Signed)
Subjective:  Patient ID: Barbara White, female    DOB: 28-Apr-1950  Age: 68 y.o. MRN: [id number]  CC: No chief complaint on file.   HPI Barbara White presents for DM, HTN, dyslipidemia f/u  Outpatient Medications Prior to Visit  Medication Sig Dispense Refill  . aspirin 81 MG chewable tablet Chew 1 tablet (81 mg total) by mouth daily. 90 tablet 3  . blood glucose meter kit and supplies KIT Dispense based on patient and insurance preference. Use up to four times daily as directed. (FOR ICD-9 250.00, 250.01). 1 each 0  . Cholecalciferol (VITAMIN D3) 2000 units capsule Take 1 capsule (2,000 Units total) daily by mouth. 100 capsule 3  . diltiazem (CARDIZEM) 30 MG tablet Take 1 tablet (30 mg total) by mouth 2 (two) times daily. 180 tablet 3  . Insulin Detemir (LEVEMIR FLEXPEN) 100 UNIT/ML Pen 45 units each morning and 10 units each evening, and pen needles 2/day 25 pen 3  . levothyroxine (SYNTHROID, LEVOTHROID) 50 MCG tablet Take 1 tablet (50 mcg total) by mouth daily. 90 tablet 3  . Multiple Vitamins-Minerals (ICAPS AREDS 2 PO) Take 1 capsule by mouth 2 (two) times daily.    Marland Kitchen omeprazole (PRILOSEC) 20 MG capsule Take 1 capsule (20 mg total) by mouth daily. 90 capsule 3  . simvastatin (ZOCOR) 20 MG tablet TAKE ONE TABLET BY MOUTH DAILY AT 6:00PM 90 tablet 3  . SYNJARDY 05-998 MG TABS TAKE ONE TABLET BY MOUTH TWICE A DAY 180 tablet 2  . losartan (COZAAR) 50 MG tablet Take 1 tablet (50 mg total) by mouth daily. 90 tablet 3  . doxycycline (VIBRA-TABS) 100 MG tablet Take 1 tablet (100 mg total) by mouth 2 (two) times daily. 20 tablet 0   No facility-administered medications prior to visit.     ROS: Review of Systems  Constitutional: Positive for fatigue. Negative for activity change, appetite change, chills and unexpected weight change.  HENT: Negative for congestion, mouth sores and sinus pressure.   Eyes: Negative for visual disturbance.  Respiratory: Negative for cough and chest tightness.    Gastrointestinal: Negative for abdominal pain and nausea.  Genitourinary: Negative for difficulty urinating, frequency and vaginal pain.  Musculoskeletal: Positive for gait problem. Negative for back pain.  Skin: Negative for pallor and rash.  Neurological: Positive for light-headedness. Negative for dizziness, tremors, weakness, numbness and headaches.  Psychiatric/Behavioral: Negative for confusion, sleep disturbance and suicidal ideas.    Objective:  BP 118/74 (BP Location: Left Arm, Patient Position: Sitting, Cuff Size: Normal)   Pulse 83   Temp 97.9 F (36.6 C) (Oral)   Ht _0  (1.575 m)   Wt 141 lb (64 kg)   SpO2 97%   BMI 25.79 kg/m   BP Readings from Last 3 Encounters:  02/07/18 118/74  10/11/17 110/72  10/01/17 112/66    Wt Readings from Last 3 Encounters:  02/07/18 141 lb (64 kg)  10/11/17 143 lb (64.9 kg)  10/01/17 141 lb (64 kg)    Physical Exam Constitutional:      General: She is not in acute distress.    Appearance: She is well-developed.  HENT:     Head: Normocephalic.     Right Ear: External ear normal.     Left Ear: External ear normal.     Nose: Nose normal.  Eyes:     General:        Right eye: No discharge.        Left eye: No discharge.  Conjunctiva/sclera: Conjunctivae normal.     Pupils: Pupils are equal, round, and reactive to light.  Neck:     Musculoskeletal: Normal range of motion and neck supple.     Thyroid: No thyromegaly.     Vascular: No JVD.     Trachea: No tracheal deviation.  Cardiovascular:     Rate and Rhythm: Normal rate and regular rhythm.     Heart sounds: Normal heart sounds.  Pulmonary:     Effort: No respiratory distress.     Breath sounds: No stridor. No wheezing.  Abdominal:     General: Bowel sounds are normal. There is no distension.     Palpations: Abdomen is soft. There is no mass.     Tenderness: There is no abdominal tenderness. There is no guarding or rebound.  Musculoskeletal:        General: No  tenderness.  Lymphadenopathy:     Cervical: No cervical adenopathy.  Skin:    Findings: No erythema or rash.  Neurological:     Cranial Nerves: No cranial nerve deficit.     Motor: No abnormal muscle tone.     Coordination: Coordination normal.     Deep Tendon Reflexes: Reflexes normal.  Psychiatric:        Behavior: Behavior normal.        Thought Content: Thought content normal.        Judgment: Judgment normal.   ataxic a little  Lab Results  Component Value Date   WBC 6.1 10/01/2017   HGB 12.6 10/01/2017   HCT 38.6 10/01/2017   PLT 298.0 10/01/2017   GLUCOSE 106 (H) 10/01/2017   CHOL 149 04/10/2017   TRIG 152.0 (H) 04/10/2017   HDL 38.20 (L) 04/10/2017   LDLCALC 81 04/10/2017   ALT 12 10/01/2017   AST 13 10/01/2017   NA 136 10/01/2017   K 4.1 10/01/2017   CL 101 10/01/2017   CREATININE 1.11 10/01/2017   BUN 20 10/01/2017   CO2 28 10/01/2017   TSH 1.10 10/01/2017   HGBA1C 8.7 (H) 10/01/2017   MICROALBUR <0.7 04/10/2017    No results found.  Assessment & Plan:   There are no diagnoses linked to this encounter.   No orders of the defined types were placed in this encounter.    Follow-up: No follow-ups on file.  Walker Kehr, MD

## 2018-02-07 NOTE — Assessment & Plan Note (Signed)
simvastatin 

## 2018-02-07 NOTE — Assessment & Plan Note (Signed)
balance exercises Needs a better CBG control

## 2018-02-07 NOTE — Assessment & Plan Note (Signed)
Levemir  [employer]

## 2018-02-08 ENCOUNTER — Other Ambulatory Visit: Payer: Self-pay | Admitting: Internal Medicine

## 2018-02-08 ENCOUNTER — Ambulatory Visit (INDEPENDENT_AMBULATORY_CARE_PROVIDER_SITE_OTHER): Payer: Medicare Other | Admitting: Endocrinology

## 2018-02-08 ENCOUNTER — Encounter: Payer: Self-pay | Admitting: Endocrinology

## 2018-02-08 VITALS — BP 102/60 | HR 76 | Ht 62.0 in | Wt 141.0 lb

## 2018-02-08 DIAGNOSIS — E039 Hypothyroidism, unspecified: Secondary | ICD-10-CM

## 2018-02-08 DIAGNOSIS — I10 Essential (primary) hypertension: Secondary | ICD-10-CM

## 2018-02-08 DIAGNOSIS — E1142 Type 2 diabetes mellitus with diabetic polyneuropathy: Secondary | ICD-10-CM | POA: Diagnosis not present

## 2018-02-08 DIAGNOSIS — Z794 Long term (current) use of insulin: Secondary | ICD-10-CM

## 2018-02-08 LAB — VITAMIN B12: VITAMIN B 12: 86 pg/mL — AB (ref 211–911)

## 2018-02-08 LAB — POCT GLYCOSYLATED HEMOGLOBIN (HGB A1C): Hemoglobin A1C: 7.5 % — AB (ref 4.0–5.6)

## 2018-02-08 LAB — GLUCOSE, POCT (MANUAL RESULT ENTRY): POC Glucose: 111 mg/dl — AB (ref 70–99)

## 2018-02-08 LAB — TSH: TSH: 2.02 u[IU]/mL (ref 0.35–4.50)

## 2018-02-08 MED ORDER — INSULIN DETEMIR 100 UNIT/ML FLEXPEN: PEN_INJECTOR | SUBCUTANEOUS | 3 refills | Status: DC

## 2018-02-08 NOTE — Patient Instructions (Addendum)
check your blood sugar twice a day.  vary the time of day when you check, between before the 3 meals, and at bedtime.  also check if you have symptoms of your blood sugar being too high or too low.  please keep a record of the readings and bring it to your next appointment here (or you can bring the meter itself).  You can write it on any piece of paper.  please call us sooner if your blood sugar goes below 70, or if you have a lot of readings over 200.   please increase the levemir to 50 units each morning and 5 units each evening.  Please continue the same "synjardy."  Please come back for a follow-up appointment in 4 months.      ????? ????? ????? ???? ????? ??????? ????? ????? ???? ????????? ??? ??? ???? ??? ????, 3 ??????? ??? ??? ????? ????? ????? ????? ??????? ?????? ??? ???? ?? ??? ?? ?????? ????? ?????? ???? ??? ??????? ??? ?????? ???? ??? ????? ?????? ????? ??? ??? ????? ??????? ???????????????????? ????? ???? (?? ???? ????? ????? ???? ?????)? ???? ??? ???? ?????? ??????? ????? ?????? ????? ????? ??????? ?????? ??? 70 ?? ???? ??? ???? ??? ?? 200 ?? ???? ????? ????? ???? ??? ?????? ?????????? ?? ????? ???? ??? ??????? ????? ??????? 50 ????? ??? ??????? ????????? 5 ????? ??????? ???? ??? ??? "synjardy" ??????? ???? ???? ??? 4 ???? ???-?? ?????????????????? ???? ???? ?????

## 2018-02-08 NOTE — Progress Notes (Signed)
Subjective:    Patient ID: Barbara FusiSyeda White, female    DOB: [birth year], 68 y.o.   MRN: [id number]  HPI Pt returns for f/u of diabetes mellitus: DM type: Insulin-requiring type 2 Dx'ed: 1998 Complications: polyneuropathy Therapy: insulin since 2003, and 2 oral meds GDM: 1976 DKA: never Severe hypoglycemia: never Pancreatitis: never Pancreatic imaging: normal on 2017 CT.  Other: she declines multiple daily injections. Interval history: no cbg record, but states cbg's vary from 93-200.  It is in general higher as the day goes on.  She takes 45 units QAM, and 10 units QPM.  She never misses the insulin.   Past Medical History:  Diagnosis Date  . Arthritis   . Chicken pox 1967  . Diabetes mellitus without complication (HCC)   . GERD (gastroesophageal reflux disease)   . Headache   . Hyperlipidemia   . Hypertension   . Mass of left breast   . Polyneuropathy   . Thyroid disease     Past Surgical History:  Procedure Laterality Date  . ABDOMINAL HYSTERECTOMY  05/2010  . APPENDECTOMY  1984  . BREAST LUMPECTOMY WITH RADIOACTIVE SEED LOCALIZATION Left 03/16/2017   Procedure: LEFT BREAST LUMPECTOMY WITH RADIOACTIVE SEED LOCALIZATION ERAS PATHWAY;  Surgeon: Claud KelpIngram, Haywood, MD;  [facility];  Service: General;  Laterality: Left;  . CATARACT EXTRACTION W/ INTRAOCULAR LENS  IMPLANT, BILATERAL    . CHOLECYSTECTOMY  2011    Social History   Socioeconomic History  . Marital status: Widowed    Spouse name: Not on file  . Number of children: Not on file  . Years of education: Not on file  . Highest education level: Not on file  Occupational History  . Not on file  Social Needs  . Financial resource strain: Not on file  . Food insecurity:    Worry: Not on file    Inability: Not on file  . Transportation needs:    Medical: Not on file    Non-medical: Not on file  Tobacco Use  . Smoking status: Never Smoker  . Smokeless tobacco: Never Used  Substance and Sexual Activity  .  Alcohol use: No  . Drug use: No  . Sexual activity: Not Currently  Lifestyle  . Physical activity:    Days per week: Not on file    Minutes per session: Not on file  . Stress: Not on file  Relationships  . Social connections:    Talks on phone: Not on file    Gets together: Not on file    Attends religious service: Not on file    Active member of club or organization: Not on file    Attends meetings of clubs or organizations: Not on file    Relationship status: Not on file  . Intimate partner violence:    Fear of current or ex partner: Not on file    Emotionally abused: Not on file    Physically abused: Not on file    Forced sexual activity: Not on file  Other Topics Concern  . Not on file  Social History Narrative  . Not on file    Current Outpatient Medications on File Prior to Visit  Medication Sig Dispense Refill  . aspirin 81 MG chewable tablet Chew 1 tablet (81 mg total) by mouth daily. 90 tablet 3  . Cholecalciferol (VITAMIN D3) 2000 units capsule Take 1 capsule (2,000 Units total) daily by mouth. 100 capsule 3  . diltiazem (CARDIZEM) 30 MG tablet Take 1 tablet (30  mg total) by mouth 2 (two) times daily. 180 tablet 3  . Empagliflozin-metFORMIN HCl (SYNJARDY) 05-998 MG TABS Take 1 tablet by mouth 2 (two) times daily. 180 tablet 2  . levothyroxine (SYNTHROID, LEVOTHROID) 50 MCG tablet Take 1 tablet (50 mcg total) by mouth daily. 90 tablet 3  . losartan (COZAAR) 50 MG tablet Take 1 tablet (50 mg total) by mouth daily. 90 tablet 3  . Multiple Vitamins-Minerals (ICAPS AREDS 2 PO) Take 1 capsule by mouth 2 (two) times daily.    Marland Kitchen. omeprazole (PRILOSEC) 20 MG capsule Take 1 capsule (20 mg total) by mouth daily. 90 capsule 3  . simvastatin (ZOCOR) 20 MG tablet TAKE ONE TABLET BY MOUTH DAILY AT 6:00PM 90 tablet 3   No current facility-administered medications on file prior to visit.     Allergies  Allergen Reactions  . Penicillins Swelling and Other (See Comments)    Pt  collapsed 1972-  Fasting month ?vaso-vagal after blood was taken. No rash Has patient had a PCN reaction causing immediate rash, facial/tongue/throat swelling, SOB or lightheadedness with hypotension:  Has patient had a PCN reaction causing severe rash involving mucus membranes or skin necrosis:  Has patient had a PCN reaction that required hospitalization:  Has patient had a PCN reaction occurring within the last 10 years:  If all of the above answers are "NO", then may proceed with Cephalospor  . Sulfa Antibiotics Itching    Family History  Problem Relation Age of Onset  . Stroke Father   . Diabetes Father   . Heart disease Father 4045       with MI  . Hypertension Father   . Hyperlipidemia Father     BP 102/60 (BP Location: Left Arm, Patient Position: Sitting, Cuff Size: Normal)   Pulse 76   Ht 5\' 2"  (1.575 m)   Wt 141 lb (64 kg)   SpO2 96%   BMI 25.79 kg/m    Review of Systems She denies hypoglycemia    Objective:   Physical Exam VITAL SIGNS:  See vs page GENERAL: no distress Pulses: dorsalis pedis intact bilat.   MSK: no deformity of the feet CV: no leg edema Skin:  no ulcer on the feet.  normal color and temp on the feet. Neuro: sensation is intact to touch on the feet  Lab Results  Component Value Date   HGBA1C 7.5 (A) 02/08/2018        Assessment & Plan:  Insulin-requiring type 2 DM, with PN: Based on the pattern of her cbg's, she needs some adjustment in her therapy   Patient Instructions  check your blood sugar twice a day.  vary the time of day when you check, between before the 3 meals, and at bedtime.  also check if you have symptoms of your blood sugar being too high or too low.  please keep a record of the readings and bring it to your next appointment here (or you can bring the meter itself).  You can write it on any piece of paper.  please call us sooner if your blood sugar goes below 70, or if you have a lot of readings over 200.   please increase  the levemir to 50 units each morning and 5 units each evening.  Please continue the same "synjardy."  Please come back for a follow-up appointment in 4 months.      ????? ????? ????? ???? ????? ??????? ????? ????? ???? ????????? ??? ??? ???? ??? ????, 3 ??????? ??? ??? ????? ????? ????? ????? ??????? ?????? ??? ???? ?? ??? ?? ?????? ????? ?????? ???? ??? ??????? ??? ?????? ???? ??? ????? ?????? ????? ??? ??? ????? ??????? ???????????????????? ????? ???? (?? ???? ????? ????? ???? ?????)? ???? ??? ???? ?????? ??????? ????? ?????? ????? ????? ??????? ?????? ???  70 ?? ???? ??? ???? ??? ?? 200 ?? ???? ????? ????? ???? ??? ?????? ?????????? ?? ????? ???? ??? ??????? ????? ??????? 50 ????? ??? ??????? ????????? 5 ????? ??????? ???? ??? ??? "synjardy" ??????? ???? ???? ??? 4 ???? ???-?? ?????????????????? ???? ???? ?????

## 2018-02-10 ENCOUNTER — Other Ambulatory Visit: Payer: Self-pay | Admitting: Internal Medicine

## 2018-02-10 DIAGNOSIS — E538 Deficiency of other specified B group vitamins: Secondary | ICD-10-CM | POA: Insufficient documentation

## 2018-02-10 MED ORDER — CYANOCOBALAMIN 1000 MCG/ML IJ SOLN: 1000.0000 ug | Freq: Once | INTRAMUSCULAR | 6 refills | Status: AC

## 2018-02-10 NOTE — Assessment & Plan Note (Signed)
Start b12

## 2018-02-12 ENCOUNTER — Ambulatory Visit: Payer: Medicare Other | Admitting: Endocrinology

## 2018-03-12 ENCOUNTER — Other Ambulatory Visit: Payer: Self-pay | Admitting: Internal Medicine

## 2018-04-03 ENCOUNTER — Other Ambulatory Visit: Payer: Self-pay | Admitting: Family Medicine

## 2018-04-03 DIAGNOSIS — E079 Disorder of thyroid, unspecified: Secondary | ICD-10-CM

## 2018-04-03 DIAGNOSIS — Z1231 Encounter for screening mammogram for malignant neoplasm of breast: Secondary | ICD-10-CM

## 2018-05-21 ENCOUNTER — Other Ambulatory Visit: Payer: Self-pay

## 2018-05-21 DIAGNOSIS — Z794 Long term (current) use of insulin: Secondary | ICD-10-CM

## 2018-05-21 DIAGNOSIS — E1142 Type 2 diabetes mellitus with diabetic polyneuropathy: Secondary | ICD-10-CM

## 2018-05-21 MED ORDER — INSULIN DETEMIR 100 UNIT/ML FLEXPEN: PEN_INJECTOR | SUBCUTANEOUS | 3 refills | Status: DC

## 2018-06-12 ENCOUNTER — Encounter: Payer: Self-pay | Admitting: Endocrinology

## 2018-06-13 ENCOUNTER — Ambulatory Visit (INDEPENDENT_AMBULATORY_CARE_PROVIDER_SITE_OTHER): Payer: Medicare Other | Admitting: Endocrinology

## 2018-06-13 ENCOUNTER — Other Ambulatory Visit: Payer: Self-pay

## 2018-06-13 DIAGNOSIS — Z794 Long term (current) use of insulin: Secondary | ICD-10-CM

## 2018-06-13 DIAGNOSIS — E1142 Type 2 diabetes mellitus with diabetic polyneuropathy: Secondary | ICD-10-CM

## 2018-06-13 NOTE — Progress Notes (Signed)
Subjective:    Patient ID: Barbara FusiSyeda Teems, female    DOB: 1950/02/26, 68 y.o.   MRN: [id number]  HPI  telehealth visit today via doxy video visit.  Alternatives to telehealth are presented to this patient, and the patient agrees to the telehealth visit. Pt is advised of the cost of the visit, and agrees to this, also.   Patient is at son's home in [place], and I am at the office.   Persons attending the telehealth visit: the patient, son (who translates), and I.  Pt returns for f/u of diabetes mellitus: DM type: Insulin-requiring type 2 Dx'ed: 1998 Complications: polyneuropathy Therapy: insulin since 2003, and 2 oral meds.   GDM: 1976 DKA: never Severe hypoglycemia: never Pancreatitis: never Pancreatic imaging: normal on 2017 CT.   Other: she declines multiple daily injections.  Interval history: pt says cbg's vary from 95-133.  It is in general higher as the day goes on.  She never misses the insulin.  However, she takes 20 units QAM and 34 units QPM.  pt states she feels well in general.   Past Medical History:  Diagnosis Date  . Arthritis   . Chicken pox 1967  . Diabetes mellitus without complication (HCC)   . GERD (gastroesophageal reflux disease)   . Headache   . Hyperlipidemia   . Hypertension   . Mass of left breast   . Polyneuropathy   . Thyroid disease     Past Surgical History:  Procedure Laterality Date  . ABDOMINAL HYSTERECTOMY  05/2010  . APPENDECTOMY  1984  . BREAST LUMPECTOMY WITH RADIOACTIVE SEED LOCALIZATION Left 03/16/2017   Procedure: LEFT BREAST LUMPECTOMY WITH RADIOACTIVE SEED LOCALIZATION ERAS PATHWAY;  Surgeon: Claud KelpIngram, Haywood, MD;  [facility];  Service: General;  Laterality: Left;  . CATARACT EXTRACTION W/ INTRAOCULAR LENS  IMPLANT, BILATERAL    . CHOLECYSTECTOMY  2011    Social History   Socioeconomic History  . Marital status: Widowed    Spouse name: Not on file  . Number of children: Not on file  . Years of education: Not on file  .  Highest education level: Not on file  Occupational History  . Not on file  Social Needs  . Financial resource strain: Not on file  . Food insecurity:    Worry: Not on file    Inability: Not on file  . Transportation needs:    Medical: Not on file    Non-medical: Not on file  Tobacco Use  . Smoking status: Never Smoker  . Smokeless tobacco: Never Used  Substance and Sexual Activity  . Alcohol use: No  . Drug use: No  . Sexual activity: Not Currently  Lifestyle  . Physical activity:    Days per week: Not on file    Minutes per session: Not on file  . Stress: Not on file  Relationships  . Social connections:    Talks on phone: Not on file    Gets together: Not on file    Attends religious service: Not on file    Active member of club or organization: Not on file    Attends meetings of clubs or organizations: Not on file    Relationship status: Not on file  . Intimate partner violence:    Fear of current or ex partner: Not on file    Emotionally abused: Not on file    Physically abused: Not on file    Forced sexual activity: Not on file  Other Topics Concern  .  Not on file  Social History Narrative  . Not on file    Current Outpatient Medications on File Prior to Visit  Medication Sig Dispense Refill  . aspirin 81 MG chewable tablet Chew 1 tablet (81 mg total) by mouth daily. 90 tablet 3  . Cholecalciferol (VITAMIN D3) 2000 units capsule Take 1 capsule (2,000 Units total) daily by mouth. 100 capsule 3  . diltiazem (CARDIZEM) 30 MG tablet TAKE ONE TABLET BY MOUTH TWICE A DAY 180 tablet 3  . Empagliflozin-metFORMIN HCl (SYNJARDY) 05-998 MG TABS Take 1 tablet by mouth 2 (two) times daily. 180 tablet 2  . Insulin Detemir (LEVEMIR FLEXPEN) 100 UNIT/ML Pen 50 units each morning and 5 units each evening, and pen needles 2/day. 25 pen 3  . levothyroxine (SYNTHROID, LEVOTHROID) 50 MCG tablet TAKE ONE TABLET BY MOUTH DAILY 90 tablet 3  . losartan (COZAAR) 50 MG tablet TAKE ONE  TABLET BY MOUTH DAILY 90 tablet 3  . Multiple Vitamins-Minerals (ICAPS AREDS 2 PO) Take 1 capsule by mouth 2 (two) times daily.    Marland Kitchen omeprazole (PRILOSEC) 20 MG capsule Take 1 capsule (20 mg total) by mouth daily. 90 capsule 3  . simvastatin (ZOCOR) 20 MG tablet TAKE ONE TABLET BY MOUTH DAILY AT 6:00PM 90 tablet 3   No current facility-administered medications on file prior to visit.     Allergies  Allergen Reactions  . Penicillins Swelling and Other (See Comments)    Pt collapsed 1972-  Fasting month ?vaso-vagal after blood was taken. No rash Has patient had a PCN reaction causing immediate rash, facial/tongue/throat swelling, SOB or lightheadedness with hypotension:  Has patient had a PCN reaction causing severe rash involving mucus membranes or skin necrosis:  Has patient had a PCN reaction that required hospitalization:  Has patient had a PCN reaction occurring within the last 10 years:  If all of the above answers are "NO", then may proceed with Cephalospor  . Sulfa Antibiotics Itching    Family History  Problem Relation Age of Onset  . Stroke Father   . Diabetes Father   . Heart disease Father 55       with MI  . Hypertension Father   . Hyperlipidemia Father      Review of Systems She denies hypoglycemia.      Objective:   Physical Exam  Lab Results  Component Value Date   CREATININE 1.13 02/07/2018   BUN 19 02/07/2018   NA 135 02/07/2018   K 4.2 02/07/2018   CL 101 02/07/2018   CO2 26 02/07/2018       Assessment & Plan:  Insulin-requiring type 2 DM, with PN: Based on the pattern of her cbg's, she needs some adjustment in her therapy.     Patient Instructions  check your blood sugar twice a day.  vary the time of day when you check, between before the 3 meals, and at bedtime.  also check if you have symptoms of your blood sugar being too high or too low.  please keep a record of the readings and bring it to your next appointment here (or you can bring the  meter itself).  You can write it on any piece of paper.  please call us sooner if your blood sugar goes below 70, or if you have a lot of readings over 200.   please change the levemir to 27 units BID Please continue the same synjardy. Please come back for a follow-up appointment in 3 months.

## 2018-06-13 NOTE — Patient Instructions (Addendum)
check your blood sugar twice a day.  vary the time of day when you check, between before the 3 meals, and at bedtime.  also check if you have symptoms of your blood sugar being too high or too low.  please keep a record of the readings and bring it to your next appointment here (or you can bring the meter itself).  You can write it on any piece of paper.  please call us sooner if your blood sugar goes below 70, or if you have a lot of readings over 200.   please change the levemir to 27 units BID Please continue the same synjardy. Please come back for a follow-up appointment in 3 months.

## 2018-06-18 ENCOUNTER — Ambulatory Visit (INDEPENDENT_AMBULATORY_CARE_PROVIDER_SITE_OTHER): Payer: Medicare Other | Admitting: Internal Medicine

## 2018-06-18 ENCOUNTER — Encounter: Payer: Self-pay | Admitting: Internal Medicine

## 2018-06-18 DIAGNOSIS — E785 Hyperlipidemia, unspecified: Secondary | ICD-10-CM | POA: Diagnosis not present

## 2018-06-18 DIAGNOSIS — I10 Essential (primary) hypertension: Secondary | ICD-10-CM | POA: Diagnosis not present

## 2018-06-18 DIAGNOSIS — Z794 Long term (current) use of insulin: Secondary | ICD-10-CM | POA: Diagnosis not present

## 2018-06-18 DIAGNOSIS — E1142 Type 2 diabetes mellitus with diabetic polyneuropathy: Secondary | ICD-10-CM | POA: Diagnosis not present

## 2018-06-18 MED ORDER — DILTIAZEM HCL 30 MG PO TABS: 30.0000 mg | ORAL_TABLET | Freq: Two times a day (BID) | ORAL | 3 refills | Status: DC

## 2018-06-18 NOTE — Progress Notes (Signed)
Virtual Visit via Video Note  I connected with Barbara White on 06/18/18 at  4:00 PM EDT by a video enabled telemedicine application and verified that I am speaking with the correct person using two identifiers.   I discussed the limitations of evaluation and management by telemedicine and the availability of in person appointments. The patient expressed understanding and agreed to proceed.  History of Present Illness: We need to follow-up on diabetes, dyslipidemia, hypertension.  The patient is doing well.  She just finished fasting on Saturday for Ramadan.  She lost weight.  She is in [place] with her son.  They are confined to his house due to COVID19.  She has not run out of her medicines yet.  Her son Janyth Contes helps to translate.  There has been no runny nose, cough, chest pain, shortness of breath, abdominal pain, diarrhea, constipation, arthralgias, skin rashes.   Observations/Objective: The patient appears to be in no acute distress, looks well.  Assessment and Plan:  See my Assessment and Plan. Follow Up Instructions:    I discussed the assessment and treatment plan with the patient. The patient was provided an opportunity to ask questions and all were answered. The patient agreed with the plan and demonstrated an understanding of the instructions.   The patient was advised to call back or seek an in-person evaluation if the symptoms worsen or if the condition fails to improve as anticipated.  I provided face-to-face time during this encounter. We were at different locations.   Sonda Primes, MD

## 2018-06-18 NOTE — Assessment & Plan Note (Signed)
Simvastatin 

## 2018-06-18 NOTE — Assessment & Plan Note (Signed)
Levemir Synjardy

## 2018-06-18 NOTE — Assessment & Plan Note (Signed)
Diltiazem Losartan 

## 2018-07-31 ENCOUNTER — Other Ambulatory Visit: Payer: Self-pay | Admitting: Internal Medicine

## 2018-10-04 ENCOUNTER — Telehealth: Payer: Self-pay | Admitting: Internal Medicine

## 2018-10-04 DIAGNOSIS — E1142 Type 2 diabetes mellitus with diabetic polyneuropathy: Secondary | ICD-10-CM

## 2018-10-04 DIAGNOSIS — R2681 Unsteadiness on feet: Secondary | ICD-10-CM

## 2018-10-04 DIAGNOSIS — E538 Deficiency of other specified B group vitamins: Secondary | ICD-10-CM

## 2018-10-04 DIAGNOSIS — E559 Vitamin D deficiency, unspecified: Secondary | ICD-10-CM

## 2018-10-04 DIAGNOSIS — Z794 Long term (current) use of insulin: Secondary | ICD-10-CM

## 2018-10-04 DIAGNOSIS — E034 Atrophy of thyroid (acquired): Secondary | ICD-10-CM

## 2018-10-04 NOTE — Telephone Encounter (Signed)
Please advise 

## 2018-10-04 NOTE — Telephone Encounter (Signed)
Copied from [place] [id number]. Topic: Quick Communication - See Telephone Encounter >> Oct 03, 2018  5:35 PM Loma Boston wrote: CRM for notification. See Telephone encounter for: 10/03/18. Cb is [phone] pt made an appt today and daughter was speaking, she wants to know if Dr Mamie Nick wants lab work before this virtual appt on Mon 9/28 to reach out to her if so and she will bring her mother in. After Hrs call

## 2018-10-04 NOTE — Telephone Encounter (Signed)
Yes.  Labs are ordered.  Thanks

## 2018-10-04 NOTE — Telephone Encounter (Signed)
LM notifying pt

## 2018-10-16 ENCOUNTER — Other Ambulatory Visit (INDEPENDENT_AMBULATORY_CARE_PROVIDER_SITE_OTHER): Payer: Medicare Other

## 2018-10-16 ENCOUNTER — Other Ambulatory Visit: Payer: Self-pay | Admitting: Internal Medicine

## 2018-10-16 DIAGNOSIS — E559 Vitamin D deficiency, unspecified: Secondary | ICD-10-CM

## 2018-10-16 DIAGNOSIS — Z794 Long term (current) use of insulin: Secondary | ICD-10-CM

## 2018-10-16 DIAGNOSIS — E1142 Type 2 diabetes mellitus with diabetic polyneuropathy: Secondary | ICD-10-CM

## 2018-10-16 DIAGNOSIS — E034 Atrophy of thyroid (acquired): Secondary | ICD-10-CM | POA: Diagnosis not present

## 2018-10-16 DIAGNOSIS — E785 Hyperlipidemia, unspecified: Secondary | ICD-10-CM

## 2018-10-16 DIAGNOSIS — E538 Deficiency of other specified B group vitamins: Secondary | ICD-10-CM | POA: Diagnosis not present

## 2018-10-16 DIAGNOSIS — R2681 Unsteadiness on feet: Secondary | ICD-10-CM

## 2018-10-16 LAB — CBC WITH DIFFERENTIAL/PLATELET
Basophils Absolute: 0 10*3/uL (ref 0.0–0.1)
Basophils Relative: 0.4 % (ref 0.0–3.0)
Eosinophils Absolute: 0.2 10*3/uL (ref 0.0–0.7)
Eosinophils Relative: 2.4 % (ref 0.0–5.0)
HCT: 40.3 % (ref 36.0–46.0)
Hemoglobin: 12.9 g/dL (ref 12.0–15.0)
Lymphocytes Relative: 27.7 % (ref 12.0–46.0)
Lymphs Abs: 2.1 10*3/uL (ref 0.7–4.0)
MCHC: 32.1 g/dL (ref 30.0–36.0)
MCV: 80.9 fl (ref 78.0–100.0)
Monocytes Absolute: 0.5 10*3/uL (ref 0.1–1.0)
Monocytes Relative: 6.2 % (ref 3.0–12.0)
Neutro Abs: 4.7 10*3/uL (ref 1.4–7.7)
Neutrophils Relative %: 63.3 % (ref 43.0–77.0)
Platelets: 252 10*3/uL (ref 150.0–400.0)
RBC: 4.98 Mil/uL (ref 3.87–5.11)
RDW: 14.1 % (ref 11.5–15.5)
WBC: 7.4 10*3/uL (ref 4.0–10.5)

## 2018-10-16 LAB — LIPID PANEL
Cholesterol: 166 mg/dL (ref 0–200)
HDL: 36.5 mg/dL — ABNORMAL LOW (ref >39.00)
NonHDL: 129.39
Total CHOL/HDL Ratio: 5
Triglycerides: 268 mg/dL — ABNORMAL HIGH (ref 0.0–149.0)
VLDL: 53.6 mg/dL — ABNORMAL HIGH (ref 0.0–40.0)

## 2018-10-16 LAB — BASIC METABOLIC PANEL
BUN: 19 mg/dL (ref 6–23)
CO2: 26 mEq/L (ref 19–32)
Calcium: 9.7 mg/dL (ref 8.4–10.5)
Chloride: 101 mEq/L (ref 96–112)
Creatinine, Ser: 0.95 mg/dL (ref 0.40–1.20)
GFR: 58.46 mL/min — ABNORMAL LOW (ref >60.00)
Glucose, Bld: 90 mg/dL (ref 70–99)
Potassium: 4.1 mEq/L (ref 3.5–5.1)
Sodium: 137 mEq/L (ref 135–145)

## 2018-10-16 LAB — HEPATIC FUNCTION PANEL
ALT: 10 U/L (ref 0–35)
AST: 15 U/L (ref 0–37)
Albumin: 4.2 g/dL (ref 3.5–5.2)
Alkaline Phosphatase: 48 U/L (ref 39–117)
Bilirubin, Direct: 0 mg/dL (ref 0.0–0.3)
Total Bilirubin: 0.4 mg/dL (ref 0.2–1.2)
Total Protein: 7.1 g/dL (ref 6.0–8.3)

## 2018-10-16 LAB — VITAMIN D 25 HYDROXY (VIT D DEFICIENCY, FRACTURES): VITD: 51.32 ng/mL (ref 30.00–100.00)

## 2018-10-16 LAB — LDL CHOLESTEROL, DIRECT: Direct LDL: 98 mg/dL

## 2018-10-16 LAB — HEMOGLOBIN A1C: Hgb A1c MFr Bld: 8 % — ABNORMAL HIGH (ref 4.6–6.5)

## 2018-10-16 LAB — TSH: TSH: 2.38 u[IU]/mL (ref 0.35–4.50)

## 2018-10-16 LAB — VITAMIN B12: Vitamin B-12: 689 pg/mL (ref 211–911)

## 2018-10-21 ENCOUNTER — Ambulatory Visit (INDEPENDENT_AMBULATORY_CARE_PROVIDER_SITE_OTHER): Payer: Medicare Other | Admitting: Internal Medicine

## 2018-10-21 DIAGNOSIS — E785 Hyperlipidemia, unspecified: Secondary | ICD-10-CM

## 2018-10-21 DIAGNOSIS — E1142 Type 2 diabetes mellitus with diabetic polyneuropathy: Secondary | ICD-10-CM

## 2018-10-21 DIAGNOSIS — E039 Hypothyroidism, unspecified: Secondary | ICD-10-CM | POA: Diagnosis not present

## 2018-10-21 DIAGNOSIS — Z794 Long term (current) use of insulin: Secondary | ICD-10-CM

## 2018-10-21 DIAGNOSIS — I10 Essential (primary) hypertension: Secondary | ICD-10-CM

## 2018-10-21 MED ORDER — LEVOTHYROXINE SODIUM 50 MCG PO TABS: 50.0000 ug | ORAL_TABLET | Freq: Every day | ORAL | 1 refills | Status: DC

## 2018-10-21 MED ORDER — DILTIAZEM HCL 30 MG PO TABS: 30.0000 mg | ORAL_TABLET | Freq: Two times a day (BID) | ORAL | 1 refills | Status: DC

## 2018-10-21 MED ORDER — FAMOTIDINE 40 MG PO TABS: 40.0000 mg | ORAL_TABLET | Freq: Every day | ORAL | 1 refills | Status: DC

## 2018-10-21 MED ORDER — SYNJARDY 5-1000 MG PO TABS: 1.0000 | ORAL_TABLET | Freq: Two times a day (BID) | ORAL | 1 refills | Status: DC

## 2018-10-21 MED ORDER — INSULIN DETEMIR 100 UNIT/ML FLEXPEN: PEN_INJECTOR | SUBCUTANEOUS | 1 refills | Status: DC

## 2018-10-21 MED ORDER — SIMVASTATIN 20 MG PO TABS: ORAL_TABLET | ORAL | 1 refills | Status: DC

## 2018-10-21 MED ORDER — LOSARTAN POTASSIUM 50 MG PO TABS: 50.0000 mg | ORAL_TABLET | Freq: Every day | ORAL | 1 refills | Status: DC

## 2018-10-21 NOTE — Progress Notes (Signed)
Virtual Visit via Video Note  I connected with Barbara White on 10/21/18 at  4:00 PM EDT by a video enabled telemedicine application and verified that I am speaking with the correct person using two identifiers.   I discussed the limitations of evaluation and management by telemedicine and the availability of in person appointments. The patient expressed understanding and agreed to proceed.  History of Present Illness: We need to follow-up on DM, HTN, hypothyroidism.  She is planning to go back to [place] in October.  She needs a 9-month prescription printed for her maintenance medicines.  Her sister is interpreting.  There has been no runny nose, cough, chest pain, shortness of breath, abdominal pain, diarrhea, constipation, arthralgias, skin rashes.   Observations/Objective: The patient appears to be in no acute distress, looks well.  Assessment and Plan:  See my Assessment and Plan. Follow Up Instructions:    I discussed the assessment and treatment plan with the patient. The patient was provided an opportunity to ask questions and all were answered. The patient agreed with the plan and demonstrated an understanding of the instructions.   The patient was advised to call back or seek an in-person evaluation if the symptoms worsen or if the condition fails to improve as anticipated.  I provided face-to-face time during this encounter. We were at different locations.   Walker Kehr, MD

## 2018-10-23 ENCOUNTER — Encounter: Payer: Self-pay | Admitting: Internal Medicine

## 2018-10-31 ENCOUNTER — Other Ambulatory Visit: Payer: Self-pay

## 2018-11-04 ENCOUNTER — Encounter: Payer: Self-pay | Admitting: Endocrinology

## 2018-11-04 ENCOUNTER — Ambulatory Visit (INDEPENDENT_AMBULATORY_CARE_PROVIDER_SITE_OTHER): Payer: Medicare Other | Admitting: Endocrinology

## 2018-11-04 ENCOUNTER — Other Ambulatory Visit: Payer: Self-pay

## 2018-11-04 DIAGNOSIS — Z794 Long term (current) use of insulin: Secondary | ICD-10-CM | POA: Diagnosis not present

## 2018-11-04 DIAGNOSIS — E1142 Type 2 diabetes mellitus with diabetic polyneuropathy: Secondary | ICD-10-CM

## 2018-11-04 DIAGNOSIS — Z23 Encounter for immunization: Secondary | ICD-10-CM

## 2018-11-04 MED ORDER — INSULIN DETEMIR 100 UNIT/ML FLEXPEN: PEN_INJECTOR | SUBCUTANEOUS | 1 refills | Status: DC

## 2018-11-04 NOTE — Progress Notes (Signed)
Subjective:    Patient ID: Barbara White, female    DOB: September 13, 1950, 68 y.o.   MRN: [id number]  HPI Pt returns for f/u of diabetes mellitus: DM type: Insulin-requiring type 2 Dx'ed: 1998 Complications: polyneuropathy Therapy: insulin since 2003, and 2 oral meds.   GDM: 1976 DKA: never Severe hypoglycemia: never Pancreatitis: never Pancreatic imaging: normal on 2017 CT.   Other: she declines multiple daily injections.  Interval history: pt says cbg's vary from 99-203.  It is in general higher as the day goes on.  She never misses the insulin.  she takes 27 units BID.  pt states she feels well in general.  She will go to [place] soon, x 6 months.   Past Medical History:  Diagnosis Date  . Arthritis   . Chicken pox 1967  . Diabetes mellitus without complication ([facility])   . GERD (gastroesophageal reflux disease)   . Headache   . Hyperlipidemia   . Hypertension   . Mass of left breast   . Polyneuropathy   . Thyroid disease     Past Surgical History:  Procedure Laterality Date  . ABDOMINAL HYSTERECTOMY  05/2010  . APPENDECTOMY  1984  . BREAST LUMPECTOMY WITH RADIOACTIVE SEED LOCALIZATION Left 03/16/2017   Procedure: LEFT BREAST LUMPECTOMY WITH RADIOACTIVE SEED LOCALIZATION ERAS PATHWAY;  Surgeon: Claud Kelp, MD;  Location: [facility] OR;  Service: General;  Laterality: Left;  . CATARACT EXTRACTION W/ INTRAOCULAR LENS  IMPLANT, BILATERAL    . CHOLECYSTECTOMY  2011    Social History   Socioeconomic History  . Marital status: Widowed    Spouse name: Not on file  . Number of children: Not on file  . Years of education: Not on file  . Highest education level: Not on file  Occupational History  . Not on file  Social Needs  . Financial resource strain: Not on file  . Food insecurity    Worry: Not on file    Inability: Not on file  . Transportation needs    Medical: Not on file    Non-medical: Not on file  Tobacco Use  . Smoking status: Never Smoker  . Smokeless tobacco:  Never Used  Substance and Sexual Activity  . Alcohol use: No  . Drug use: No  . Sexual activity: Not Currently  Lifestyle  . Physical activity    Days per week: Not on file    Minutes per session: Not on file  . Stress: Not on file  Relationships  . Social Musician on phone: Not on file    Gets together: Not on file    Attends religious service: Not on file    Active member of club or organization: Not on file    Attends meetings of clubs or organizations: Not on file    Relationship status: Not on file  . Intimate partner violence    Fear of current or ex partner: Not on file    Emotionally abused: Not on file    Physically abused: Not on file    Forced sexual activity: Not on file  Other Topics Concern  . Not on file  Social History Narrative  . Not on file    Current Outpatient Medications on File Prior to Visit  Medication Sig Dispense Refill  . aspirin 81 MG chewable tablet Chew 1 tablet (81 mg total) by mouth daily. 90 tablet 3  . Cholecalciferol (VITAMIN D3) 2000 units capsule Take 1 capsule (2,000 Units total) daily by  mouth. 100 capsule 3  . diltiazem (CARDIZEM) 30 MG tablet Take 1 tablet (30 mg total) by mouth 2 (two) times daily. 360 tablet 1  . Empagliflozin-metFORMIN HCl (SYNJARDY) 05-998 MG TABS Take 1 tablet by mouth 2 (two) times daily. 360 tablet 1  . famotidine (PEPCID) 40 MG tablet Take 1 tablet (40 mg total) by mouth daily. 180 tablet 1  . levothyroxine (SYNTHROID) 50 MCG tablet Take 1 tablet (50 mcg total) by mouth daily. 180 tablet 1  . losartan (COZAAR) 50 MG tablet Take 1 tablet (50 mg total) by mouth daily. 180 tablet 1  . Multiple Vitamins-Minerals (ICAPS AREDS 2 PO) Take 1 capsule by mouth 2 (two) times daily.    . simvastatin (ZOCOR) 20 MG tablet TAKE ONE TABLET BY MOUTH EVERY EVENING AT 6PM 180 tablet 1   No current facility-administered medications on file prior to visit.     Allergies  Allergen Reactions  . Penicillins Swelling  and Other (See Comments)    Pt collapsed 1972-  Fasting month ?vaso-vagal after blood was taken. No rash Has patient had a PCN reaction causing immediate rash, facial/tongue/throat swelling, SOB or lightheadedness with hypotension:  Has patient had a PCN reaction causing severe rash involving mucus membranes or skin necrosis:  Has patient had a PCN reaction that required hospitalization:  Has patient had a PCN reaction occurring within the last 10 years:  If all of the above answers are "NO", then may proceed with Cephalospor  . Sulfa Antibiotics Itching    Family History  Problem Relation Age of Onset  . Stroke Father   . Diabetes Father   . Heart disease Father 49       with MI  . Hypertension Father   . Hyperlipidemia Father     BP 100/68 (BP Location: Left Arm, Patient Position: Sitting, Cuff Size: Normal)   Pulse 77   Ht 5\' 2"  (1.575 m)   Wt 139 lb 9.6 oz (63.3 kg)   SpO2 98%   BMI 25.53 kg/m    Review of Systems She denies hypoglycemia.     Objective:   Physical Exam VITAL SIGNS:  See vs page GENERAL: no distress Pulses: dorsalis pedis intact bilat.   MSK: no deformity of the feet CV: no leg edema Skin:  no ulcer on the feet.  normal color and temp on the feet. Neuro: sensation is intact to touch on the feet    Lab Results  Component Value Date   HGBA1C 8.0 (H) 10/16/2018       Assessment & Plan:  Insulin-requiring type 2 DM, with PN: worse  Patient Instructions  check your blood sugar twice a day.  vary the time of day when you check, between before the 3 meals, and at bedtime.  also check if you have symptoms of your blood sugar being too high or too low.  please keep a record of the readings and bring it to your next appointment here (or you can bring the meter itself).  You can write it on any piece of paper.  please call us sooner if your blood sugar goes below 70, or if you have a lot of readings over 200.   please change the levemir to 32 units in  the morning, and 32 units in the evening.   Please continue the same synjardy.   Please come back for a follow-up appointment in 3 months, virtually if necessary.     ???? ????? ????? ????? ??????? ??????? ????? ????? ???? ????????? ??? ??? ???? ??? ????,  3 ??????? ??? ??? ????? ????? ????? ????? ??????? ?????? ??? ???? ?? ??? ?? ?????? ????? ?????? ???? ??? ??????? ??? ?????? ???? ??? ????? ?????? ????? ??? ??? ????? ??????? ???????????????????? ????? ???? (?? ???? ????? ????? ???? ?????)? ???? ??? ???? ?????? ??????? ????? ?????? ????? ????? ??????? ?????? ??? 70 ?? ???? ??? ???? ??? ?? 200 ?? ???? ????? ????? ???? ??? ?????? ????? ?? ????? ???? ??? ????? ????????? 32 ????? ??? ????????? 32 ????? ???????? ????? ???? ??? ??? ???????? ??????? ???? ?????? ????????? ??????? ??? 3 ????? ????? ???-?? ?????????????????? ???? ???? ?????.    

## 2018-11-04 NOTE — Patient Instructions (Addendum)
check your blood sugar twice a day.  vary the time of day when you check, between before the 3 meals, and at bedtime.  also check if you have symptoms of your blood sugar being too high or too low.  please keep a record of the readings and bring it to your next appointment here (or you can bring the meter itself).  You can write it on any piece of paper.  please call us sooner if your blood sugar goes below 70, or if you have a lot of readings over 200.   please change the levemir to 32 units in the morning, and 32 units in the evening.   Please continue the same synjardy.   Please come back for a follow-up appointment in 3 months, virtually if necessary.     ???? ????? ????? ????? ??????? ??????? ????? ????? ???? ????????? ??? ??? ???? ??? ????, 3 ??????? ??? ??? ????? ????? ????? ????? ??????? ?????? ??? ???? ?? ??? ?? ?????? ????? ?????? ???? ??? ??????? ??? ?????? ???? ??? ????? ?????? ????? ??? ??? ????? ??????? ???????????????????? ????? ???? (?? ???? ????? ????? ???? ?????)? ???? ??? ???? ?????? ??????? ????? ?????? ????? ????? ??????? ?????? ??? 70 ?? ???? ??? ???? ??? ?? 200 ?? ???? ????? ????? ???? ??? ?????? ????? ?? ????? ???? ??? ????? ????????? 32 ????? ??? ????????? 32 ????? ???????? ????? ???? ??? ??? ???????? ??????? ???? ?????? ????????? ??????? ??? 3 ????? ????? ???-?? ?????????????????? ???? ???? ?????.

## 2018-11-19 ENCOUNTER — Other Ambulatory Visit: Payer: Self-pay

## 2018-11-19 DIAGNOSIS — Z20822 Contact with and (suspected) exposure to covid-19: Secondary | ICD-10-CM

## 2018-11-19 DIAGNOSIS — Z20828 Contact with and (suspected) exposure to other viral communicable diseases: Secondary | ICD-10-CM | POA: Diagnosis not present

## 2018-11-20 LAB — HM DIABETES EYE EXAM

## 2018-11-21 ENCOUNTER — Telehealth: Payer: Self-pay

## 2018-11-21 ENCOUNTER — Other Ambulatory Visit: Payer: Self-pay

## 2018-11-21 DIAGNOSIS — Z794 Long term (current) use of insulin: Secondary | ICD-10-CM

## 2018-11-21 DIAGNOSIS — E1142 Type 2 diabetes mellitus with diabetic polyneuropathy: Secondary | ICD-10-CM

## 2018-11-21 LAB — NOVEL CORONAVIRUS, NAA: SARS-CoV-2, NAA: NOT DETECTED

## 2018-11-21 MED ORDER — INSULIN DETEMIR 100 UNIT/ML FLEXPEN: PEN_INJECTOR | SUBCUTANEOUS | 1 refills | Status: DC

## 2018-11-21 NOTE — Telephone Encounter (Signed)
MEDICATION: Insulin Detemir (LEVEMIR FLEXPEN) 100 UNIT/ML Pen  PH[facility]ACY:  [facility] [place] [place] [facility] 90 DAY SUPPLY : yes  IS PATIENT OUT OF MEDICATION: no  IF NOT; HOW MUCH IS LEFT:   LAST APPOINTMENT DATE: @10 /12/2018  NEXT APPOINTMENT DATE:@2 /16/2021  DO WE HAVE YOUR PERMISSION TO LEAVE A DETAILED MESSAGE:  OTHER COMMENTS: patient is traveling to [place] till next year of March    **Let patient know to contact pharmacy at the end of the day to make sure medication is ready. **  ** Please notify patient to allow 48-72 hours to process**  **Encourage patient to contact the pharmacy for refills or they can request refills through [facility]**

## 2018-11-21 NOTE — Telephone Encounter (Signed)
Rx sent 

## 2019-01-21 ENCOUNTER — Encounter: Payer: Self-pay | Admitting: Internal Medicine

## 2019-03-11 ENCOUNTER — Ambulatory Visit: Payer: Medicare Other | Admitting: Endocrinology

## 2019-04-03 ENCOUNTER — Encounter: Payer: Self-pay | Admitting: Cardiovascular Disease

## 2019-04-14 ENCOUNTER — Telehealth: Payer: Self-pay | Admitting: Internal Medicine

## 2019-04-14 DIAGNOSIS — E785 Hyperlipidemia, unspecified: Secondary | ICD-10-CM

## 2019-04-14 DIAGNOSIS — E039 Hypothyroidism, unspecified: Secondary | ICD-10-CM

## 2019-04-14 DIAGNOSIS — I10 Essential (primary) hypertension: Secondary | ICD-10-CM

## 2019-04-14 MED ORDER — SIMVASTATIN 20 MG PO TABS: ORAL_TABLET | ORAL | 1 refills | Status: DC

## 2019-04-14 MED ORDER — LEVOTHYROXINE SODIUM 50 MCG PO TABS: 50.0000 ug | ORAL_TABLET | Freq: Every day | ORAL | 1 refills | Status: DC

## 2019-04-14 MED ORDER — FAMOTIDINE 40 MG PO TABS: 40.0000 mg | ORAL_TABLET | Freq: Every day | ORAL | 1 refills | Status: DC

## 2019-04-14 MED ORDER — SYNJARDY 5-1000 MG PO TABS: 1.0000 | ORAL_TABLET | Freq: Two times a day (BID) | ORAL | 1 refills | Status: DC

## 2019-04-14 MED ORDER — LOSARTAN POTASSIUM 50 MG PO TABS: 50.0000 mg | ORAL_TABLET | Freq: Every day | ORAL | 1 refills | Status: DC

## 2019-04-14 MED ORDER — DILTIAZEM HCL 30 MG PO TABS: 30.0000 mg | ORAL_TABLET | Freq: Two times a day (BID) | ORAL | 1 refills | Status: DC

## 2019-04-14 NOTE — Telephone Encounter (Signed)
RXs sent.

## 2019-04-14 NOTE — Telephone Encounter (Signed)
    1.Medication Requested: diltiazem (CARDIZEM) 30 MG tablet Empagliflozin-metFORMIN HCl (SYNJARDY) 05-998 MG TABS levothyroxine (SYNTHROID) 50 MCG tablet simvastatin (ZOCOR) 20 MG tablet famotidine (PEPCID) 40 MG tablet losartan (COZAAR) 50 MG tablet   2. Pharmacy (Name, Street, City):[facility] - [place], [place] - [address]  3. On Med List: yes  4. Last Visit with PCP: 10/21/18  5. Next visit date with PCP: 04/29/19   Agent: Please be advised that RX refills may take up to 3 business days. We ask that you follow-up with your pharmacy.

## 2019-04-19 ENCOUNTER — Other Ambulatory Visit: Payer: Self-pay | Admitting: Internal Medicine

## 2019-04-19 DIAGNOSIS — E1142 Type 2 diabetes mellitus with diabetic polyneuropathy: Secondary | ICD-10-CM

## 2019-04-19 DIAGNOSIS — Z794 Long term (current) use of insulin: Secondary | ICD-10-CM

## 2019-04-29 ENCOUNTER — Encounter: Payer: Self-pay | Admitting: Internal Medicine

## 2019-04-29 ENCOUNTER — Other Ambulatory Visit (INDEPENDENT_AMBULATORY_CARE_PROVIDER_SITE_OTHER): Payer: Medicare Other

## 2019-04-29 ENCOUNTER — Other Ambulatory Visit: Payer: Self-pay

## 2019-04-29 ENCOUNTER — Ambulatory Visit (INDEPENDENT_AMBULATORY_CARE_PROVIDER_SITE_OTHER): Payer: Medicare Other | Admitting: Internal Medicine

## 2019-04-29 VITALS — BP 115/70 | HR 73 | Temp 98.0°F | Ht 62.0 in | Wt 136.0 lb

## 2019-04-29 DIAGNOSIS — E785 Hyperlipidemia, unspecified: Secondary | ICD-10-CM

## 2019-04-29 DIAGNOSIS — E538 Deficiency of other specified B group vitamins: Secondary | ICD-10-CM

## 2019-04-29 DIAGNOSIS — E559 Vitamin D deficiency, unspecified: Secondary | ICD-10-CM

## 2019-04-29 DIAGNOSIS — I10 Essential (primary) hypertension: Secondary | ICD-10-CM

## 2019-04-29 DIAGNOSIS — E034 Atrophy of thyroid (acquired): Secondary | ICD-10-CM

## 2019-04-29 DIAGNOSIS — E1142 Type 2 diabetes mellitus with diabetic polyneuropathy: Secondary | ICD-10-CM | POA: Diagnosis not present

## 2019-04-29 DIAGNOSIS — K5730 Diverticulosis of large intestine without perforation or abscess without bleeding: Secondary | ICD-10-CM

## 2019-04-29 DIAGNOSIS — Z794 Long term (current) use of insulin: Secondary | ICD-10-CM | POA: Diagnosis not present

## 2019-04-29 LAB — CBC WITH DIFFERENTIAL/PLATELET
Basophils Absolute: 0.1 10*3/uL (ref 0.0–0.1)
Basophils Relative: 0.8 % (ref 0.0–3.0)
Eosinophils Absolute: 0.3 10*3/uL (ref 0.0–0.7)
Eosinophils Relative: 4.6 % (ref 0.0–5.0)
HCT: 37.1 % (ref 36.0–46.0)
Hemoglobin: 12.2 g/dL (ref 12.0–15.0)
Lymphocytes Relative: 36.4 % (ref 12.0–46.0)
Lymphs Abs: 2.5 10*3/uL (ref 0.7–4.0)
MCHC: 32.8 g/dL (ref 30.0–36.0)
MCV: 80.7 fl (ref 78.0–100.0)
Monocytes Absolute: 0.5 10*3/uL (ref 0.1–1.0)
Monocytes Relative: 7 % (ref 3.0–12.0)
Neutro Abs: 3.5 10*3/uL (ref 1.4–7.7)
Neutrophils Relative %: 51.2 % (ref 43.0–77.0)
Platelets: 269 10*3/uL (ref 150.0–400.0)
RBC: 4.6 Mil/uL (ref 3.87–5.11)
RDW: 13.9 % (ref 11.5–15.5)
WBC: 6.9 10*3/uL (ref 4.0–10.5)

## 2019-04-29 LAB — BASIC METABOLIC PANEL
BUN: 15 mg/dL (ref 6–23)
CO2: 28 mEq/L (ref 19–32)
Calcium: 9.2 mg/dL (ref 8.4–10.5)
Chloride: 102 mEq/L (ref 96–112)
Creatinine, Ser: 1.17 mg/dL (ref 0.40–1.20)
GFR: 45.9 mL/min — ABNORMAL LOW (ref >60.00)
Glucose, Bld: 130 mg/dL — ABNORMAL HIGH (ref 70–99)
Potassium: 4 mEq/L (ref 3.5–5.1)
Sodium: 136 mEq/L (ref 135–145)

## 2019-04-29 LAB — LIPID PANEL
Cholesterol: 153 mg/dL (ref 0–200)
HDL: 29.6 mg/dL — ABNORMAL LOW (ref >39.00)
NonHDL: 123.82
Total CHOL/HDL Ratio: 5
Triglycerides: 349 mg/dL — ABNORMAL HIGH (ref 0.0–149.0)
VLDL: 69.8 mg/dL — ABNORMAL HIGH (ref 0.0–40.0)

## 2019-04-29 LAB — TSH: TSH: 3.2 u[IU]/mL (ref 0.35–4.50)

## 2019-04-29 LAB — HEPATIC FUNCTION PANEL
ALT: 10 U/L (ref 0–35)
AST: 12 U/L (ref 0–37)
Albumin: 4.1 g/dL (ref 3.5–5.2)
Alkaline Phosphatase: 52 U/L (ref 39–117)
Bilirubin, Direct: 0 mg/dL (ref 0.0–0.3)
Total Bilirubin: 0.3 mg/dL (ref 0.2–1.2)
Total Protein: 6.8 g/dL (ref 6.0–8.3)

## 2019-04-29 MED ORDER — VITAMIN B-12 1000 MCG PO TABS: 1000.0000 ug | ORAL_TABLET | Freq: Every day | ORAL | 3 refills | Status: DC

## 2019-04-29 NOTE — Assessment & Plan Note (Signed)
Levemir 

## 2019-04-29 NOTE — Assessment & Plan Note (Signed)
Diltiazem Losartan

## 2019-04-29 NOTE — Assessment & Plan Note (Signed)
C/o occ LLQ pain x 12 months. She had a work-up of this LLQ in [place] - negative Previous CT w/diverticulosis in 2017 PO abx if worse Info given

## 2019-04-29 NOTE — Patient Instructions (Signed)

## 2019-04-29 NOTE — Assessment & Plan Note (Signed)
Levothroid °Labs °

## 2019-04-29 NOTE — Progress Notes (Signed)
Subjective:  Patient ID: Barbara White, female    DOB: 1950/03/26  Age: 69 y.o. MRN: [id number]  CC: No chief complaint on file.   HPI Olivia Pavelko presents for HTN, DM, GERD f/u. Back from [place]... C/o occ LLQ pain x 12 months. She had a work-up of this LLQ in [place] - negative Previous CT w/diverticulosis in 2017  Outpatient Medications Prior to Visit  Medication Sig Dispense Refill  . aspirin 81 MG chewable tablet Chew 1 tablet (81 mg total) by mouth daily. 90 tablet 3  . chlorhexidine (PERIDEX) 0.12 % solution     . Cholecalciferol (VITAMIN D3) 2000 units capsule Take 1 capsule (2,000 Units total) daily by mouth. 100 capsule 3  . diltiazem (CARDIZEM) 30 MG tablet Take 1 tablet (30 mg total) by mouth 2 (two) times daily. 360 tablet 1  . Empagliflozin-metFORMIN HCl (SYNJARDY) 05-998 MG TABS Take 1 tablet by mouth 2 (two) times daily. 360 tablet 1  . famotidine (PEPCID) 40 MG tablet Take 1 tablet (40 mg total) by mouth daily. 180 tablet 1  . LEVEMIR FLEXTOUCH 100 UNIT/ML FlexPen INJECT 50 UNITS EVERY MORNING AND 5 UNITS EACH EVENING 15 mL 11  . levothyroxine (SYNTHROID) 50 MCG tablet Take 1 tablet (50 mcg total) by mouth daily. 180 tablet 1  . losartan (COZAAR) 50 MG tablet Take 1 tablet (50 mg total) by mouth daily. 180 tablet 1  . Multiple Vitamins-Minerals (ICAPS AREDS 2 PO) Take 1 capsule by mouth 2 (two) times daily.    . simvastatin (ZOCOR) 20 MG tablet TAKE ONE TABLET BY MOUTH EVERY EVENING AT 6PM 180 tablet 1   No facility-administered medications prior to visit.    ROS: Review of Systems  Constitutional: Negative for activity change, appetite change, chills, fatigue and unexpected weight change.  HENT: Negative for congestion, mouth sores and sinus pressure.   Eyes: Negative for visual disturbance.  Respiratory: Negative for cough and chest tightness.   Gastrointestinal: Positive for abdominal pain. Negative for nausea.  Genitourinary: Negative for difficulty  urinating, frequency and vaginal pain.  Musculoskeletal: Negative for back pain and gait problem.  Skin: Negative for pallor and rash.  Neurological: Positive for numbness. Negative for dizziness, tremors, weakness and headaches.  Psychiatric/Behavioral: Negative for confusion and sleep disturbance.    Objective:  BP 115/70 (BP Location: Left Arm, Patient Position: Sitting, Cuff Size: Large)   Pulse 73   Temp 98 F (36.7 C) (Oral)   Ht 5\' 2"  (1.575 m)   Wt 136 lb (61.7 kg)   SpO2 96%   BMI 24.87 kg/m   BP Readings from Last 3 Encounters:  04/29/19 115/70  11/04/18 100/68  02/08/18 102/60    Wt Readings from Last 3 Encounters:  04/29/19 136 lb (61.7 kg)  11/04/18 139 lb 9.6 oz (63.3 kg)  02/08/18 141 lb (64 kg)    Physical Exam Constitutional:      General: She is not in acute distress.    Appearance: She is well-developed. She is obese.  HENT:     Head: Normocephalic.     Right Ear: External ear normal.     Left Ear: External ear normal.     Nose: Nose normal.  Eyes:     General:        Right eye: No discharge.        Left eye: No discharge.     Conjunctiva/sclera: Conjunctivae normal.     Pupils: Pupils are equal, round, and reactive to light.  Neck:     Thyroid: No thyromegaly.     Vascular: No JVD.     Trachea: No tracheal deviation.  Cardiovascular:     Rate and Rhythm: Normal rate and regular rhythm.     Heart sounds: Normal heart sounds.  Pulmonary:     Effort: No respiratory distress.     Breath sounds: No stridor. No wheezing.  Abdominal:     General: Bowel sounds are normal. There is no distension.     Palpations: Abdomen is soft. There is no mass.     Tenderness: There is no abdominal tenderness. There is no guarding or rebound.  Musculoskeletal:        General: No tenderness.     Cervical back: Normal range of motion and neck supple.  Lymphadenopathy:     Cervical: No cervical adenopathy.  Skin:    Findings: No erythema or rash.    Neurological:     Mental Status: She is oriented to person, place, and time.     Cranial Nerves: No cranial nerve deficit.     Motor: No abnormal muscle tone.     Coordination: Coordination normal.     Deep Tendon Reflexes: Reflexes normal.  Psychiatric:        Behavior: Behavior normal.        Thought Content: Thought content normal.        Judgment: Judgment normal.     Lab Results  Component Value Date   WBC 7.4 10/16/2018   HGB 12.9 10/16/2018   HCT 40.3 10/16/2018   PLT 252.0 10/16/2018   GLUCOSE 90 10/16/2018   CHOL 166 10/16/2018   TRIG 268.0 (H) 10/16/2018   HDL 36.50 (L) 10/16/2018   LDLDIRECT 98.0 10/16/2018   LDLCALC 81 04/10/2017   ALT 10 10/16/2018   AST 15 10/16/2018   NA 137 10/16/2018   K 4.1 10/16/2018   CL 101 10/16/2018   CREATININE 0.95 10/16/2018   BUN 19 10/16/2018   CO2 26 10/16/2018   TSH 2.38 10/16/2018   HGBA1C 8.0 (H) 10/16/2018   MICROALBUR <0.7 04/10/2017    No results found.  Assessment & Plan:     Follow-up: No follow-ups on file.  Sonda Primes, MD

## 2019-04-29 NOTE — Assessment & Plan Note (Signed)
Labs

## 2019-04-29 NOTE — Assessment & Plan Note (Signed)
On B12 

## 2019-04-30 LAB — VITAMIN D 25 HYDROXY (VIT D DEFICIENCY, FRACTURES): VITD: 37.8 ng/mL (ref 30.00–100.00)

## 2019-04-30 LAB — URINALYSIS
Bilirubin Urine: NEGATIVE
Hgb urine dipstick: NEGATIVE
Ketones, ur: NEGATIVE
Leukocytes,Ua: NEGATIVE
Nitrite: NEGATIVE
Specific Gravity, Urine: 1.01 (ref 1.000–1.030)
Total Protein, Urine: NEGATIVE
Urine Glucose: >=1000 — AB
Urobilinogen, UA: 0.2 (ref 0.0–1.0)
pH: 5.5 (ref 5.0–8.0)

## 2019-04-30 LAB — LDL CHOLESTEROL, DIRECT: Direct LDL: 86 mg/dL

## 2019-04-30 LAB — HEMOGLOBIN A1C: Hgb A1c MFr Bld: 7.8 % — ABNORMAL HIGH (ref 4.6–6.5)

## 2019-04-30 LAB — VITAMIN B12: Vitamin B-12: 803 pg/mL (ref 211–911)

## 2019-05-09 ENCOUNTER — Other Ambulatory Visit: Payer: Self-pay

## 2019-05-09 ENCOUNTER — Telehealth (INDEPENDENT_AMBULATORY_CARE_PROVIDER_SITE_OTHER): Payer: Medicare Other | Admitting: Endocrinology

## 2019-05-09 ENCOUNTER — Encounter: Payer: Self-pay | Admitting: Endocrinology

## 2019-05-09 DIAGNOSIS — Z794 Long term (current) use of insulin: Secondary | ICD-10-CM

## 2019-05-09 DIAGNOSIS — E1142 Type 2 diabetes mellitus with diabetic polyneuropathy: Secondary | ICD-10-CM | POA: Diagnosis not present

## 2019-05-09 NOTE — Progress Notes (Signed)
Subjective:    Patient ID: Barbara White, female    DOB: 04-15-1950, 69 y.o.   MRN: [id number]  HPI  telehealth visit today via video visit.  Alternatives to telehealth are presented to this patient, and the patient agrees to the telehealth visit. Pt is advised of the cost of the visit, and agrees to this, also.   Patient is at home, and I am at the office.   Persons attending the telehealth visit: the patient, dtr, and I Pt returns for f/u of diabetes mellitus: DM type: Insulin-requiring type 2 Dx'ed: 1017 Complications: PN Therapy: insulin since 2003, and 2 oral meds.   GDM: 1976 DKA: never Severe hypoglycemia: never.  Pancreatitis: never.   Pancreatic imaging: normal on 2017 CT.   Other: she declines multiple daily injections.  Interval history: pt says cbg's vary from 70-186.  It is in general higher as the day goes on.  She never misses the insulin.  pt states she feels well in general.   Past Medical History:  Diagnosis Date  . Arthritis   . Chicken pox 1967  . Diabetes mellitus without complication ([facility])   . GERD (gastroesophageal reflux disease)   . Headache   . Hyperlipidemia   . Hypertension   . Mass of left breast   . Polyneuropathy   . Thyroid disease     Past Surgical History:  Procedure Laterality Date  . ABDOMINAL HYSTERECTOMY  05/2010  . APPENDECTOMY  1984  . BREAST LUMPECTOMY WITH RADIOACTIVE SEED LOCALIZATION Left 03/16/2017   Procedure: LEFT BREAST LUMPECTOMY WITH RADIOACTIVE SEED LOCALIZATION ERAS PATHWAY;  Surgeon: Fanny Skates, MD;  Location: [place];  Service: General;  Laterality: Left;  . CATARACT EXTRACTION W/ INTRAOCULAR LENS  IMPLANT, BILATERAL    . CHOLECYSTECTOMY  2011    Social History   Socioeconomic History  . Marital status: Widowed    Spouse name: Not on file  . Number of children: Not on file  . Years of education: Not on file  . Highest education level: Not on file  Occupational History  . Not on file  Tobacco Use  .  Smoking status: Never Smoker  . Smokeless tobacco: Never Used  Substance and Sexual Activity  . Alcohol use: No  . Drug use: No  . Sexual activity: Not Currently  Other Topics Concern  . Not on file  Social History Narrative  . Not on file   Social Determinants of Health   Financial Resource Strain:   . Difficulty of Paying Living Expenses:   Food Insecurity:   . Worried About Charity fundraiser in the Last Year:   . Arboriculturist in the Last Year:   Transportation Needs:   . Film/video editor (Medical):   Marland Kitchen Lack of Transportation (Non-Medical):   Physical Activity:   . Days of Exercise per Week:   . Minutes of Exercise per Session:   Stress:   . Feeling of Stress :   Social Connections:   . Frequency of Communication with Friends and Family:   . Frequency of Social Gatherings with Friends and Family:   . Attends Religious Services:   . Active Member of Clubs or Organizations:   . Attends Archivist Meetings:   Marland Kitchen Marital Status:   Intimate Partner Violence:   . Fear of Current or Ex-Partner:   . Emotionally Abused:   Marland Kitchen Physically Abused:   . Sexually Abused:     Current Outpatient Medications on File  Prior to Visit  Medication Sig Dispense Refill  . aspirin 81 MG chewable tablet Chew 1 tablet (81 mg total) by mouth daily. 90 tablet 3  . chlorhexidine (PERIDEX) 0.12 % solution     . Cholecalciferol (VITAMIN D3) 2000 units capsule Take 1 capsule (2,000 Units total) daily by mouth. 100 capsule 3  . diltiazem (CARDIZEM) 30 MG tablet Take 1 tablet (30 mg total) by mouth 2 (two) times daily. 360 tablet 1  . Empagliflozin-metFORMIN HCl (SYNJARDY) 05-998 MG TABS Take 1 tablet by mouth 2 (two) times daily. 360 tablet 1  . famotidine (PEPCID) 40 MG tablet Take 1 tablet (40 mg total) by mouth daily. 180 tablet 1  . LEVEMIR FLEXTOUCH 100 UNIT/ML FlexPen INJECT 50 UNITS EVERY MORNING AND 5 UNITS EACH EVENING 15 mL 11  . levothyroxine (SYNTHROID) 50 MCG tablet  Take 1 tablet (50 mcg total) by mouth daily. 180 tablet 1  . losartan (COZAAR) 50 MG tablet Take 1 tablet (50 mg total) by mouth daily. 180 tablet 1  . Multiple Vitamins-Minerals (ICAPS AREDS 2 PO) Take 1 capsule by mouth 2 (two) times daily.    . simvastatin (ZOCOR) 20 MG tablet TAKE ONE TABLET BY MOUTH EVERY EVENING AT 6PM 180 tablet 1  . vitamin B-12 (CYANOCOBALAMIN) 1000 MCG tablet Take 1 tablet (1,000 mcg total) by mouth daily. 100 tablet 3   No current facility-administered medications on file prior to visit.    Allergies  Allergen Reactions  . Penicillins Swelling and Other (See Comments)    Pt collapsed 1972-  Fasting month ?vaso-vagal after blood was taken. No rash Has patient had a PCN reaction causing immediate rash, facial/tongue/throat swelling, SOB or lightheadedness with hypotension:  Has patient had a PCN reaction causing severe rash involving mucus membranes or skin necrosis:  Has patient had a PCN reaction that required hospitalization:  Has patient had a PCN reaction occurring within the last 10 years:  If all of the above answers are "NO", then may proceed with Cephalospor  . Sulfa Antibiotics Itching    Family History  Problem Relation Age of Onset  . Stroke Father   . Diabetes Father   . Heart disease Father 56       with MI  . Hypertension Father   . Hyperlipidemia Father     There were no vitals taken for this visit.  Review of Systems     Objective:   Physical Exam VITAL SIGNS:  See vs page GENERAL: no distress Pulses: dorsalis pedis intact bilat.   MSK: no deformity of the feet CV: no leg edema Skin:  no ulcer on the feet.  normal color and temp on the feet.  Neuro: sensation is intact to touch on the feet.   Lab Results  Component Value Date   HGBA1C 7.8 (H) 04/29/2019       Assessment & Plan:  Insulin-requiring type 2 DM, with PN: this is the best control this pt should aim for, given this regimen, which does match insulin to her  changing needs throughout the day Hypoglycemia: I advised her to take all of her daily insulin in the morning, but she declines.    Patient Instructions  check your blood sugar twice a day.  vary the time of day when you check, between before the 3 meals, and at bedtime.  also check if you have symptoms of your blood sugar being too high or too low.  please keep a record of the readings and bring  it to your next appointment here (or you can bring the meter itself).  You can write it on any piece of paper.  please call us sooner if your blood sugar goes below 70, or if you have a lot of readings over 200.   Please continue the same insulin.    Please continue the same synjardy.   Please come back for a follow-up appointment in 3-4 months, virtually if you prefer.

## 2019-05-09 NOTE — Patient Instructions (Addendum)
check your blood sugar twice a day.  vary the time of day when you check, between before the 3 meals, and at bedtime.  also check if you have symptoms of your blood sugar being too high or too low.  please keep a record of the readings and bring it to your next appointment here (or you can bring the meter itself).  You can write it on any piece of paper.  please call us sooner if your blood sugar goes below 70, or if you have a lot of readings over 200.   Please continue the same insulin.    Please continue the same synjardy.   Please come back for a follow-up appointment in 3-4 months, virtually if you prefer.

## 2019-06-24 DIAGNOSIS — H353132 Nonexudative age-related macular degeneration, bilateral, intermediate dry stage: Secondary | ICD-10-CM | POA: Diagnosis not present

## 2019-06-24 DIAGNOSIS — H40013 Open angle with borderline findings, low risk, bilateral: Secondary | ICD-10-CM | POA: Diagnosis not present

## 2019-07-19 ENCOUNTER — Emergency Department: Payer: Medicare Other | Admitting: Certified Registered"

## 2019-07-19 ENCOUNTER — Ambulatory Visit: Payer: Self-pay

## 2019-07-19 ENCOUNTER — Encounter
Admission: EM | Disposition: A | Discharge status: Home / Self Care | Payer: Self-pay | Source: Home / Self Care | Attending: Surgery

## 2019-07-19 ENCOUNTER — Inpatient Hospital Stay
Admission: EM | Admit: 2019-07-19 | Discharge: 2019-07-25 | DRG: 331 | Disposition: A | Discharge status: Home / Self Care | Payer: Medicare Other | Attending: Surgery | Admitting: Surgery

## 2019-07-19 ENCOUNTER — Emergency Department: Payer: Medicare Other

## 2019-07-19 DIAGNOSIS — K219 Gastro-esophageal reflux disease without esophagitis: Secondary | ICD-10-CM | POA: Diagnosis present

## 2019-07-19 DIAGNOSIS — E785 Hyperlipidemia, unspecified: Secondary | ICD-10-CM | POA: Diagnosis present

## 2019-07-19 DIAGNOSIS — I10 Essential (primary) hypertension: Secondary | ICD-10-CM | POA: Diagnosis present

## 2019-07-19 DIAGNOSIS — K5700 Diverticulitis of small intestine with perforation and abscess without bleeding: Principal | ICD-10-CM | POA: Diagnosis present

## 2019-07-19 DIAGNOSIS — Z79899 Other long term (current) drug therapy: Secondary | ICD-10-CM

## 2019-07-19 DIAGNOSIS — R109 Unspecified abdominal pain: Secondary | ICD-10-CM

## 2019-07-19 DIAGNOSIS — R42 Dizziness and giddiness: Secondary | ICD-10-CM

## 2019-07-19 DIAGNOSIS — E876 Hypokalemia: Secondary | ICD-10-CM | POA: Diagnosis not present

## 2019-07-19 DIAGNOSIS — Z794 Long term (current) use of insulin: Secondary | ICD-10-CM

## 2019-07-19 DIAGNOSIS — K458 Other specified abdominal hernia without obstruction or gangrene: Secondary | ICD-10-CM | POA: Diagnosis present

## 2019-07-19 DIAGNOSIS — K659 Peritonitis, unspecified: Secondary | ICD-10-CM

## 2019-07-19 DIAGNOSIS — Z79890 Hormone replacement therapy (postmenopausal): Secondary | ICD-10-CM

## 2019-07-19 DIAGNOSIS — E119 Type 2 diabetes mellitus without complications: Secondary | ICD-10-CM | POA: Diagnosis present

## 2019-07-19 DIAGNOSIS — E039 Hypothyroidism, unspecified: Secondary | ICD-10-CM | POA: Diagnosis present

## 2019-07-19 DIAGNOSIS — K5710 Diverticulosis of small intestine without perforation or abscess without bleeding: Secondary | ICD-10-CM

## 2019-07-19 DIAGNOSIS — M6281 Muscle weakness (generalized): Secondary | ICD-10-CM | POA: Diagnosis present

## 2019-07-19 DIAGNOSIS — K631 Perforation of intestine (nontraumatic): Secondary | ICD-10-CM | POA: Diagnosis not present

## 2019-07-19 DIAGNOSIS — I959 Hypotension, unspecified: Secondary | ICD-10-CM | POA: Diagnosis not present

## 2019-07-19 DIAGNOSIS — Z743 Need for continuous supervision: Secondary | ICD-10-CM | POA: Diagnosis not present

## 2019-07-19 HISTORY — PX: EXPLORATORY LAPAROTOMY: SHX4079

## 2019-07-19 HISTORY — DX: Disorder of thyroid, unspecified: E07.9

## 2019-07-19 HISTORY — DX: Type 2 diabetes mellitus without complications: E11.9

## 2019-07-19 HISTORY — DX: Hypothyroidism, unspecified: E03.9

## 2019-07-19 HISTORY — DX: Unspecified atrial fibrillation: I48.91

## 2019-07-19 HISTORY — DX: Essential (primary) hypertension: I10

## 2019-07-19 LAB — COMPREHENSIVE METABOLIC PANEL
ALT: 12 U/L (ref 0–55)
AST (SGOT): 15 U/L (ref 5–34)
Albumin/Globulin Ratio: 1.3 (ref 0.9–2.2)
Albumin: 3.8 g/dL (ref 3.5–5.0)
Alkaline Phosphatase: 61 U/L (ref 37–106)
Anion Gap: 12 (ref 5.0–15.0)
BUN: 19 mg/dL (ref 7–19)
Bilirubin, Total: 0.3 mg/dL (ref 0.2–1.2)
CO2: 23 mEq/L (ref 22–29)
Calcium: 9.8 mg/dL (ref 8.5–10.5)
Chloride: 104 mEq/L (ref 100–111)
Creatinine: 1.1 mg/dL — ABNORMAL HIGH (ref 0.6–1.0)
Globulin: 3 g/dL (ref 2.0–3.6)
Glucose: 122 mg/dL — ABNORMAL HIGH (ref 70–100)
Potassium: 4 mEq/L (ref 3.5–5.1)
Protein, Total: 6.8 g/dL (ref 6.0–8.3)
Sodium: 139 mEq/L (ref 136–145)

## 2019-07-19 LAB — CBC AND DIFFERENTIAL
Absolute NRBC: 0 10*3/uL (ref 0.00–0.00)
Basophils Absolute Automated: 0.02 10*3/uL (ref 0.00–0.08)
Basophils Automated: 0.2 %
Eosinophils Absolute Automated: 0.15 10*3/uL (ref 0.00–0.44)
Eosinophils Automated: 1.6 %
Hematocrit: 41.3 % (ref 34.7–43.7)
Hgb: 13.1 g/dL (ref 11.4–14.8)
Immature Granulocytes Absolute: 0.04 10*3/uL (ref 0.00–0.07)
Immature Granulocytes: 0.4 %
Lymphocytes Absolute Automated: 2.56 10*3/uL (ref 0.42–3.22)
Lymphocytes Automated: 26.7 %
MCH: 25.6 pg (ref 25.1–33.5)
MCHC: 31.7 g/dL (ref 31.5–35.8)
MCV: 80.8 fL (ref 78.0–96.0)
MPV: 9.6 fL (ref 8.9–12.5)
Monocytes Absolute Automated: 0.63 10*3/uL (ref 0.21–0.85)
Monocytes: 6.6 %
Neutrophils Absolute: 6.2 10*3/uL (ref 1.10–6.33)
Neutrophils: 64.5 %
Nucleated RBC: 0 /100 WBC (ref 0.0–0.0)
Platelets: 312 10*3/uL (ref 142–346)
RBC: 5.11 10*6/uL — ABNORMAL HIGH (ref 3.90–5.10)
RDW: 14 % (ref 11–15)
WBC: 9.6 10*3/uL — ABNORMAL HIGH (ref 3.10–9.50)

## 2019-07-19 LAB — GLUCOSE WHOLE BLOOD - POCT
Whole Blood Glucose POCT: 102 mg/dL — ABNORMAL HIGH (ref 70–100)
Whole Blood Glucose POCT: 107 mg/dL — ABNORMAL HIGH (ref 70–100)
Whole Blood Glucose POCT: 200 mg/dL — ABNORMAL HIGH (ref 70–100)
Whole Blood Glucose POCT: 97 mg/dL (ref 70–100)

## 2019-07-19 LAB — TROPONIN I: Troponin I: <0.01 ng/mL (ref 0.00–0.05)

## 2019-07-19 LAB — LACTIC ACID, PLASMA: Lactic Acid: 2.6 mmol/L — ABNORMAL HIGH (ref 0.2–2.0)

## 2019-07-19 LAB — GFR: EGFR: 49.2

## 2019-07-19 LAB — LIPASE: Lipase: 36 U/L (ref 8–78)

## 2019-07-19 LAB — TYPE AND SCREEN
AB Screen Gel: NEGATIVE
ABO Rh: A POS

## 2019-07-19 SURGERY — LAPAROTOMY, EXPLORATORY
Anesthesia: Anesthesia General | Site: Pelvis | Wound class: Clean

## 2019-07-19 MED ORDER — METRONIDAZOLE IN NACL 500 MG/100 ML IV SOLN
500.00 mg | Freq: Once | INTRAVENOUS | Status: AC
Start: 2019-07-19 — End: 2019-07-19
  Administered 2019-07-19: 16:00:00 500 mg via INTRAVENOUS
  Filled 2019-07-19: qty 100

## 2019-07-19 MED ORDER — CEFEPIME HCL 2 G IJ SOLR
2.00 g | Freq: Once | INTRAMUSCULAR | Status: AC
Start: 2019-07-19 — End: 2019-07-19
  Administered 2019-07-19: 16:00:00 2 g via INTRAVENOUS
  Filled 2019-07-19: qty 2

## 2019-07-19 MED ORDER — DEXMEDETOMIDINE HCL 200 MCG/2ML IV SOLN
INTRAVENOUS | Status: DC | PRN
Start: 2019-07-19 — End: 2019-07-19
  Administered 2019-07-19: 18:00:00 20 ug via INTRAVENOUS

## 2019-07-19 MED ORDER — MORPHINE SULFATE 4 MG/ML IJ/IV SOLN (WRAP)
4.0000 mg | Freq: Once | Status: AC
Start: 2019-07-19 — End: 2019-07-19
  Administered 2019-07-19: 16:00:00 4 mg via INTRAVENOUS
  Filled 2019-07-19: qty 1

## 2019-07-19 MED ORDER — FENTANYL CITRATE (PF) 50 MCG/ML IJ SOLN (WRAP)
INTRAMUSCULAR | Status: AC
Start: 2019-07-19 — End: 2019-07-19
  Administered 2019-07-19: 19:00:00 25 ug via INTRAVENOUS
  Filled 2019-07-19: qty 2

## 2019-07-19 MED ORDER — OXYCODONE HCL 5 MG PO TABS
5.0000 mg | ORAL_TABLET | ORAL | Status: DC | PRN
Start: 2019-07-19 — End: 2019-07-25
  Administered 2019-07-21 – 2019-07-24 (×7): 5 mg via ORAL
  Filled 2019-07-19 (×7): qty 1

## 2019-07-19 MED ORDER — DEXTROSE 50 % IV SOLN
12.50 g | INTRAVENOUS | Status: DC | PRN
Start: 2019-07-19 — End: 2019-07-25

## 2019-07-19 MED ORDER — ONDANSETRON HCL 4 MG/2ML IJ SOLN
4.0000 mg | Freq: Three times a day (TID) | INTRAMUSCULAR | Status: DC | PRN
Start: 2019-07-19 — End: 2019-07-25
  Administered 2019-07-20 – 2019-07-21 (×2): 4 mg via INTRAVENOUS
  Filled 2019-07-19 (×2): qty 2

## 2019-07-19 MED ORDER — GLYCOPYRROLATE 1 MG/5ML IJ SOLN
INTRAMUSCULAR | Status: AC
Start: 2019-07-19 — End: ?
  Filled 2019-07-19: qty 5

## 2019-07-19 MED ORDER — OXYCODONE HCL 10 MG PO TABS
10.0000 mg | ORAL_TABLET | ORAL | Status: DC | PRN
Start: 2019-07-19 — End: 2019-07-25
  Administered 2019-07-20 – 2019-07-21 (×6): 10 mg via ORAL
  Filled 2019-07-19 (×6): qty 1

## 2019-07-19 MED ORDER — SODIUM CHLORIDE 0.9 % IV SOLN
INTRAVENOUS | Status: DC
Start: 2019-07-19 — End: 2019-07-25

## 2019-07-19 MED ORDER — AMMONIA AROMATIC IN INHA
1.0000 | Freq: Once | RESPIRATORY_TRACT | Status: DC | PRN
Start: 2019-07-19 — End: 2019-07-19

## 2019-07-19 MED ORDER — KETAMINE HCL 50 MG/ML IJ SOLN
INTRAMUSCULAR | Status: DC | PRN
Start: 2019-07-19 — End: 2019-07-19
  Administered 2019-07-19: 25 mg via INTRAVENOUS

## 2019-07-19 MED ORDER — MIDAZOLAM HCL 1 MG/ML IJ SOLN (WRAP)
INTRAMUSCULAR | Status: AC
Start: 2019-07-19 — End: ?
  Filled 2019-07-19: qty 2

## 2019-07-19 MED ORDER — LEVOTHYROXINE SODIUM 50 MCG PO TABS
50.0000 ug | ORAL_TABLET | Freq: Every day | ORAL | Status: DC
Start: 2019-07-20 — End: 2019-07-25
  Administered 2019-07-20 – 2019-07-25 (×6): 50 ug via ORAL
  Filled 2019-07-19 (×6): qty 1

## 2019-07-19 MED ORDER — PHENYLEPHRINE 100 MCG/ML IV SOSY (WRAP)
PREFILLED_SYRINGE | INTRAVENOUS | Status: DC | PRN
Start: 2019-07-19 — End: 2019-07-19
  Administered 2019-07-19 (×6): 100 ug via INTRAVENOUS

## 2019-07-19 MED ORDER — ACETAMINOPHEN 325 MG PO TABS
650.0000 mg | ORAL_TABLET | Freq: Four times a day (QID) | ORAL | Status: DC
Start: 2019-07-19 — End: 2019-07-25
  Administered 2019-07-19 – 2019-07-25 (×20): 650 mg via ORAL
  Filled 2019-07-19 (×22): qty 2

## 2019-07-19 MED ORDER — HYDROMORPHONE HCL 0.5 MG/0.5 ML IJ SOLN
0.40 mg | Freq: Once | INTRAMUSCULAR | Status: AC | PRN
Start: 2019-07-19 — End: 2019-07-19
  Administered 2019-07-19: 0.4 mg via INTRAVENOUS

## 2019-07-19 MED ORDER — ONDANSETRON HCL 4 MG/2ML IJ SOLN
4.0000 mg | Freq: Once | INTRAMUSCULAR | Status: DC | PRN
Start: 2019-07-19 — End: 2019-07-19

## 2019-07-19 MED ORDER — HYDROMORPHONE HCL 0.5 MG/0.5 ML IJ SOLN
0.5000 mg | INTRAMUSCULAR | Status: DC | PRN
Start: 2019-07-19 — End: 2019-07-19
  Administered 2019-07-19: 0.5 mg via INTRAVENOUS
  Filled 2019-07-19: qty 0.5

## 2019-07-19 MED ORDER — GLUCAGON 1 MG IJ SOLR (WRAP)
1.00 mg | INTRAMUSCULAR | Status: DC | PRN
Start: 2019-07-19 — End: 2019-07-25

## 2019-07-19 MED ORDER — ONDANSETRON HCL 4 MG/2ML IJ SOLN
INTRAMUSCULAR | Status: AC
Start: 2019-07-19 — End: ?
  Filled 2019-07-19: qty 2

## 2019-07-19 MED ORDER — FENTANYL CITRATE (PF) 50 MCG/ML IJ SOLN (WRAP)
INTRAMUSCULAR | Status: AC
Start: 2019-07-19 — End: ?
  Filled 2019-07-19: qty 2

## 2019-07-19 MED ORDER — ALBUMIN HUMAN 5 % IV SOLN
INTRAVENOUS | Status: AC
Start: 2019-07-19 — End: ?
  Filled 2019-07-19: qty 500

## 2019-07-19 MED ORDER — SODIUM CHLORIDE 0.9 % IV BOLUS
1000.00 mL | Freq: Once | INTRAVENOUS | Status: AC
Start: 2019-07-19 — End: 2019-07-19
  Administered 2019-07-19: 15:00:00 1000 mL via INTRAVENOUS

## 2019-07-19 MED ORDER — FENTANYL CITRATE (PF) 50 MCG/ML IJ SOLN (WRAP)
INTRAMUSCULAR | Status: DC | PRN
Start: 2019-07-19 — End: 2019-07-19
  Administered 2019-07-19: 100 ug via INTRAVENOUS

## 2019-07-19 MED ORDER — LACTATED RINGERS IV SOLN
INTRAVENOUS | Status: DC
Start: 2019-07-19 — End: 2019-07-21

## 2019-07-19 MED ORDER — ROCURONIUM BROMIDE 50 MG/5ML IV SOLN
INTRAVENOUS | Status: AC
Start: 2019-07-19 — End: ?
  Filled 2019-07-19: qty 5

## 2019-07-19 MED ORDER — PROMETHAZINE HCL 25 MG PO TABS
25.0000 mg | ORAL_TABLET | Freq: Four times a day (QID) | ORAL | Status: DC | PRN
Start: 2019-07-19 — End: 2019-07-25

## 2019-07-19 MED ORDER — ONDANSETRON HCL 4 MG/2ML IJ SOLN
INTRAMUSCULAR | Status: DC | PRN
Start: 2019-07-19 — End: 2019-07-19
  Administered 2019-07-19: 4 mg via INTRAVENOUS

## 2019-07-19 MED ORDER — HYDROMORPHONE HCL 0.5 MG/0.5 ML IJ SOLN
0.40 mg | Freq: Once | INTRAMUSCULAR | Status: AC | PRN
Start: 2019-07-19 — End: 2019-07-20
  Administered 2019-07-20: 0.4 mg via INTRAVENOUS
  Filled 2019-07-19 (×2): qty 0.5

## 2019-07-19 MED ORDER — PANTOPRAZOLE SODIUM 40 MG IV SOLR
40.00 mg | Freq: Every day | INTRAVENOUS | Status: DC
Start: 2019-07-19 — End: 2019-07-25
  Administered 2019-07-19 – 2019-07-25 (×7): 40 mg via INTRAVENOUS
  Filled 2019-07-19 (×7): qty 40

## 2019-07-19 MED ORDER — PROPOFOL INFUSION 10 MG/ML
INTRAVENOUS | Status: DC | PRN
Start: 2019-07-19 — End: 2019-07-19
  Administered 2019-07-19: 150 mg via INTRAVENOUS

## 2019-07-19 MED ORDER — PROMETHAZINE HCL 25 MG RE SUPP
12.50 mg | Freq: Four times a day (QID) | RECTAL | Status: DC | PRN
Start: 2019-07-19 — End: 2019-07-25

## 2019-07-19 MED ORDER — SODIUM CHLORIDE 0.9 % IV BOLUS
1000.00 mL | Freq: Once | INTRAVENOUS | Status: AC
Start: 2019-07-19 — End: 2019-07-19
  Administered 2019-07-19: 16:00:00 1000 mL via INTRAVENOUS

## 2019-07-19 MED ORDER — SIMVASTATIN 10 MG PO TABS
20.0000 mg | ORAL_TABLET | Freq: Every evening | ORAL | Status: DC
Start: 2019-07-19 — End: 2019-07-25
  Administered 2019-07-21 – 2019-07-24 (×4): 20 mg via ORAL
  Filled 2019-07-19 (×6): qty 2

## 2019-07-19 MED ORDER — GLYCOPYRROLATE 0.2 MG/ML IJ SOLN (WRAP)
INTRAMUSCULAR | Status: DC | PRN
Start: 2019-07-19 — End: 2019-07-19
  Administered 2019-07-19: .2 mg via INTRAVENOUS

## 2019-07-19 MED ORDER — DILTIAZEM HCL 60 MG PO TABS
30.0000 mg | ORAL_TABLET | Freq: Two times a day (BID) | ORAL | Status: DC
Start: 2019-07-19 — End: 2019-07-25
  Administered 2019-07-19 – 2019-07-25 (×12): 30 mg via ORAL
  Filled 2019-07-19 (×13): qty 1

## 2019-07-19 MED ORDER — DEXAMETHASONE SODIUM PHOSPHATE 4 MG/ML IJ SOLN (WRAP)
INTRAMUSCULAR | Status: DC | PRN
Start: 2019-07-19 — End: 2019-07-19
  Administered 2019-07-19: 4 mg via INTRAVENOUS

## 2019-07-19 MED ORDER — SODIUM CHLORIDE 0.9 % IV SOLN
INTRAVENOUS | Status: AC | PRN
Start: 2019-07-19 — End: 2019-07-19
  Administered 2019-07-19: 3000 mL

## 2019-07-19 MED ORDER — SODIUM CHLORIDE 0.9 % IR SOLN
Status: DC | PRN
Start: 2019-07-19 — End: 2019-07-19
  Administered 2019-07-19: 1000 mL

## 2019-07-19 MED ORDER — ALBUMIN HUMAN 5 % IV SOLN
INTRAVENOUS | Status: DC | PRN
Start: 2019-07-19 — End: 2019-07-19

## 2019-07-19 MED ORDER — SUCCINYLCHOLINE CHLORIDE 20 MG/ML IJ SOLN
INTRAMUSCULAR | Status: DC | PRN
Start: 2019-07-19 — End: 2019-07-19
  Administered 2019-07-19: 100 mg via INTRAVENOUS

## 2019-07-19 MED ORDER — LIDOCAINE HCL (PF) 2 % IJ SOLN
INTRAMUSCULAR | Status: AC
Start: 2019-07-19 — End: ?
  Filled 2019-07-19: qty 5

## 2019-07-19 MED ORDER — ROCURONIUM BROMIDE 50 MG/5ML IV SOLN
INTRAVENOUS | Status: DC | PRN
Start: 2019-07-19 — End: 2019-07-19
  Administered 2019-07-19: 5 mg via INTRAVENOUS
  Administered 2019-07-19: 35 mg via INTRAVENOUS

## 2019-07-19 MED ORDER — FENTANYL CITRATE (PF) 50 MCG/ML IJ SOLN (WRAP)
25.0000 ug | INTRAMUSCULAR | Status: AC | PRN
Start: 2019-07-19 — End: 2019-07-19
  Administered 2019-07-19 (×3): 25 ug via INTRAVENOUS

## 2019-07-19 MED ORDER — METRONIDAZOLE IN NACL 500 MG/100 ML IV SOLN
500.0000 mg | Freq: Four times a day (QID) | INTRAVENOUS | Status: AC
Start: 2019-07-19 — End: 2019-07-23
  Administered 2019-07-19 – 2019-07-23 (×16): 500 mg via INTRAVENOUS
  Filled 2019-07-19 (×16): qty 100

## 2019-07-19 MED ORDER — MIDAZOLAM HCL 1 MG/ML IJ SOLN (WRAP)
INTRAMUSCULAR | Status: DC | PRN
Start: 2019-07-19 — End: 2019-07-19
  Administered 2019-07-19: 2 mg via INTRAVENOUS

## 2019-07-19 MED ORDER — ONDANSETRON 4 MG PO TBDP
4.0000 mg | ORAL_TABLET | Freq: Three times a day (TID) | ORAL | Status: DC | PRN
Start: 2019-07-19 — End: 2019-07-25

## 2019-07-19 MED ORDER — KETAMINE HCL 50 MG/ML IJ SOLN
INTRAMUSCULAR | Status: AC
Start: 2019-07-19 — End: ?
  Filled 2019-07-19: qty 0.5

## 2019-07-19 MED ORDER — DEXAMETHASONE SODIUM PHOSPHATE 4 MG/ML IJ SOLN
INTRAMUSCULAR | Status: AC
Start: 2019-07-19 — End: ?
  Filled 2019-07-19: qty 1

## 2019-07-19 MED ORDER — SODIUM CHLORIDE 0.9 % IV MBP
2.0000 g | INTRAVENOUS | Status: AC
Start: 2019-07-19 — End: 2019-07-22
  Administered 2019-07-19 – 2019-07-22 (×4): 2 g via INTRAVENOUS
  Filled 2019-07-19 (×4): qty 2000

## 2019-07-19 MED ORDER — PROPOFOL 10 MG/ML IV EMUL (WRAP)
INTRAVENOUS | Status: AC
Start: 2019-07-19 — End: ?
  Filled 2019-07-19: qty 20

## 2019-07-19 MED ORDER — HEPARIN SODIUM (PORCINE) 5000 UNIT/ML IJ SOLN
5000.0000 [IU] | Freq: Three times a day (TID) | INTRAMUSCULAR | Status: DC
Start: 2019-07-19 — End: 2019-07-25
  Administered 2019-07-19 – 2019-07-25 (×17): 5000 [IU] via SUBCUTANEOUS
  Filled 2019-07-19 (×17): qty 1

## 2019-07-19 MED ORDER — LIDOCAINE HCL 2 % IJ SOLN
INTRAMUSCULAR | Status: DC | PRN
Start: 2019-07-19 — End: 2019-07-19
  Administered 2019-07-19: 100 mg

## 2019-07-19 MED ORDER — NEOSTIGMINE METHYLSULFATE 1 MG/ML IJ/IV SOLN (WRAP)
Status: AC
Start: 2019-07-19 — End: ?
  Filled 2019-07-19: qty 10

## 2019-07-19 MED ORDER — SODIUM CHLORIDE 0.9 % IV SOLN
12.50 mg | Freq: Four times a day (QID) | INTRAVENOUS | Status: DC | PRN
Start: 2019-07-19 — End: 2019-07-25
  Filled 2019-07-19: qty 1

## 2019-07-19 MED ORDER — SUCCINYLCHOLINE CHLORIDE 20 MG/ML IJ SOLN
INTRAMUSCULAR | Status: AC
Start: 2019-07-19 — End: ?
  Filled 2019-07-19: qty 10

## 2019-07-19 MED ORDER — GLUCOSE 40 % PO GEL
15.00 g | ORAL | Status: DC | PRN
Start: 2019-07-19 — End: 2019-07-25

## 2019-07-19 MED ORDER — INSULIN LISPRO 100 UNIT/ML SC SOLN
1.00 [IU] | SUBCUTANEOUS | Status: DC
Start: 2019-07-20 — End: 2019-07-25
  Administered 2019-07-20: 01:00:00 1 [IU] via SUBCUTANEOUS
  Administered 2019-07-24: 17:00:00 3 [IU] via SUBCUTANEOUS
  Filled 2019-07-19: qty 3
  Filled 2019-07-19: qty 9

## 2019-07-19 SURGICAL SUPPLY — 102 items
ADHESIVE SKIN CLOSURE DERMABOND ADVANCED (Skin Closure) ×1 IMPLANT
ADHESIVE SKIN CLOSURE DERMABOND ADVANCED .7 ML LIQUID APPLICATOR (Skin Closure) ×1 IMPLANT
ADHESIVE SKNCLS 2 OCTYL CYNCRLT .7ML (Skin Closure) ×2
APPLICATOR CHLORAPREP 26 ML 70% ISOPROPYL ALCOHOL 2% CHLORHEXIDINE (Applicator) ×1 IMPLANT
APPLICATOR PRP 70% ISPRP 2% CHG 26ML (Applicator) ×2 IMPLANT
APPLIER IN CLP TI MED LG E-CLP III SUP (Staplers)
APPLIER INTERNAL CLIP MEDIUM LARGE L33 (Staplers) IMPLANT
APPLIER INTERNAL CLIP MEDIUM LARGE L33 CM TITANIUM PISTOL GRIP GLARE (Staplers) IMPLANT
BAG SPEC RTRVL PLS 224ML LG EPCH 10MM 6 (Procedure Accessories)
BAG SPECIMEN RETRIEVAL L6 IN X W4 IN (Procedure Accessories) IMPLANT
BAG SPECIMEN RETRIEVAL L6 IN X W4 IN LARGE OD10 MM ENDOPOUCH PLASTIC (Procedure Accessories) IMPLANT
DEVICE CLOSURE L9 IN 3-0 CV-23 TAPER (Suture) ×1 IMPLANT
DEVICE CLSR 3-0 CV-23 TPR PNT V-LOC 180 (Suture) ×2
ELECTRODE ADULT PATIENT RETURN L9 FT REM POLYHESIVE ACRYLIC FOAM (Procedure Accessories) ×1 IMPLANT
ELECTRODE PATIENT RETURN L9 FT VALLEYLAB (Procedure Accessories) ×1 IMPLANT
ELECTRODE PT RTN RM PHSV ACRL FM C30- LB (Procedure Accessories) ×2
GLOVE SRG 7.5 BGL SRG LTX STRL PF BEAD (Glove) ×2
GLOVE SRG PLISPRN 8 BGL PI INDCTR (Glove) ×2
GLOVE SURGICAL 7 1/2 BIOGEL SURGEONS (Glove) ×1 IMPLANT
GLOVE SURGICAL 7 1/2 BIOGEL SURGEONS POWDER FREE BEAD CUFF TEXTURE (Glove) ×1 IMPLANT
GLOVE SURGICAL 8 BIOGEL PI INDICATOR (Glove) ×1 IMPLANT
GLOVE SURGICAL 8 BIOGEL PI INDICATOR UNDERGLOVE POWDER FREE SMOOTH (Glove) ×1 IMPLANT
HANDLE LGHT LF STRL ADP LGHT CNTRL + TCH (Other) ×1
HANDLE LIGHT ADAPTIVE LIGHT CONTROL PLUS (Other) ×1 IMPLANT
HANDLE LIGHT ADAPTIVE LIGHT CONTROL PLUS TECHNOLOGY SNAP ON LENS TOUCH (Other) ×1 IMPLANT
HNDL LGHT ADP LGHT CNTRL + TCH SNPON LEN (Other) ×1
IRRIGATOR SUCTION ERGONOMIC HAND PIECE STRYKEFLOW II (Suction) IMPLANT
IRRIGATOR SUCTION STRYKEFLOW 2 (Suction) IMPLANT
KIT INFECTION CONTROL CUSTOM (Kits) ×2 IMPLANT
KIT INFECTION CONTROL CUSTOM IFOH03 (Kits) ×1 IMPLANT
LABEL MED LF STRL PK CSTM DISP (Other)
LABEL MEDICAL PACK CUSTOM (Other) IMPLANT
LABEL MEDICAL PACK CUSTOM CL22094 CUSTOM MED LABEL PACK (Other) IMPLANT
MANIFOLD SCT 2 STD NPTN 2 LF NS 4 PORT (Filter) ×2
MANIFOLD SUCTION 2 STANDARD 4 PORT (Filter) ×1 IMPLANT
MANIFOLD SUCTION 2 STANDARD 4 PORT NEPTUNE 2 WASTE MANAGEMENT SYSTEM (Filter) ×1 IMPLANT
PACK SRG LF STRL GN LAPSCP DISP FFX (Pack) ×1
PACK SURGICAL GEN LAPAROSCOPY FFX (Pack) ×1 IMPLANT
PROTECTOR TISS LG ALEXIS LF FLXB RTRCT (Instrument)
PROTECTOR TISS MED ALEXIS LF FLXB RTRCT (Retractor)
PROTECTOR TISS XL ALEXIS LF FLXB RTRCT (Retractor)
PROTECTOR TISSUE LARGE FLEXIBLE (Instrument) IMPLANT
PROTECTOR TISSUE LARGE FLEXIBLE RETRACTION RING ATRAUMATIC SELF RETAIN (Instrument) IMPLANT
PROTECTOR TISSUE MEDIUM FLEXBLE (Retractor) IMPLANT
PROTECTOR TISSUE MEDIUM FLEXIBLE (Retractor)
PROTECTOR TISSUE XL FLEXIBLE RETRACTION (Retractor) IMPLANT
PROTECTOR TISSUE XL FLEXIBLE RETRACTION RING ATRAUMATIC SELF RETAIN (Retractor) IMPLANT
RELOAD STAPLER 3 MM 3.5 MM 4 MM L60 MM (Staplers) ×3 IMPLANT
RELOAD STAPLER 3 MM 3.5 MM 4 MM L60 MM ENDO GIA TITANIUM MEDIUM THICK (Staplers) ×3 IMPLANT
RELOAD STPLR TI 3MM 3.5MM 4MM EGIA 60MM (Staplers) ×6
RETRACTOR ALEXIS FLEXIBLE RETRACTION (Retractor) IMPLANT
RETRACTOR TISS SM ALEXIS LF FLXB RTRCT (Retractor)
RETRACTOR TISSUE MEDIUM FLEXIBLE RETRACTION RING ATRAUMATIC SELF (Retractor) IMPLANT
RETRACTOR TISSUE SMALL FLEXIBLE RETRACTION RING ATRAUMATIC SELF RETAIN (Retractor) IMPLANT
SET HIGH FLOW SMOKE EVACUATION (Tubing) ×1 IMPLANT
SET HIGH FLOW SMOKE EVACUATION PNEUMOCLEAR TUBING (Tubing) ×1 IMPLANT
SET PNEUMOCLEAR TBG HFLO SMK EVAC (Tubing) ×1
SET TUBING PNEUMOCLEAR HFLO SMK EVAC (Tubing) ×1
SHEARS ESURG TI CRV HRMN A+ 5MM 36CM LF (Endoscopic Supplies) IMPLANT
SLEEVE LAPAROSCOPIC L75 MM UNIVERSAL (Endoscopic Supplies) ×1 IMPLANT
SLEEVE LAPAROSCOPIC L75 MM UNIVERSAL STABILITY CANNULA RADIOPAQUE (Endoscopic Supplies) ×1 IMPLANT
SLEEVE LAPSCP UNV EPTH XCL 5MM 75MM LF (Endoscopic Supplies) ×2
SOLUTION IRR 0.9% NACL 1000ML LF STRL (Irrigation Solutions) ×2
SOLUTION IRR LR 3L ARTHMTC LF PLS CNTNR (Irrigation Solutions)
SOLUTION IRRIGATION 0.9% SODIUM CHLORIDE (Irrigation Solutions) ×1 IMPLANT
SOLUTION IRRIGATION 0.9% SODIUM CHLORIDE 1000 ML PLASTIC POUR BOTTLE (Irrigation Solutions) ×1 IMPLANT
SOLUTION IRRIGATION LACTATED RINGERS (Irrigation Solutions) IMPLANT
SOLUTION IRRIGATION LACTATED RINGERS 3000 ML PLASTIC CONTAINER (Irrigation Solutions) IMPLANT
STAPLER INTNL TI SHRT UNV EGIA 12MM 6CM (Staplers) ×1
STAPLER THIN VASCULAR TISSUE L6 CM X W4 (Staplers) ×1 IMPLANT
STAPLER THIN VASCULAR TISSUE L6 CM X W4 MM OD12 MM ENDOSCOPIC (Staplers) ×1 IMPLANT
STPLR EGIA INTNL 6CMX4MM SHRT UNV TI 12 (Staplers) ×1
SUTURE ABS 0 UR-6 VCL 27IN BRD COAT VIOL (Suture) ×4
SUTURE ABS 4-0 PS2 MNCRL MTPS 27IN MFL (Suture) ×4
SUTURE COATED VICRYL 0 UR-6 L27 IN BRAID (Suture) ×2 IMPLANT
SUTURE MONOCRYL 4-0 PS-2 L27 IN (Suture) ×2 IMPLANT
SUTURE MONOCRYL 4-0 PS-2 L27 IN MONOFILAMENT UNDYED ABSORBABLE (Suture) ×2 IMPLANT
SUTURE V-LOC 180 3-0 CV-23 1/2 CIRCLE L9 IN ABS GREEN (Suture) ×1 IMPLANT
SYRINGE IRR TVK 60ML LF STRL LID SFT BLB (Syringes, Needles)
SYRINGE MEDLINE 60 ML LID SOFT BULB TIP (Syringes, Needles) IMPLANT
SYRINGE MEDLINE 60 ML LID SOFT BULB TIP IRRIGATION TYVEK (Syringes, Needles) IMPLANT
TIP SCT PLS STD YNKR LF STRL TUBE FLXB (Suction)
TIP SUCTION MEDLINE STANDARD PLASTIC (Suction) IMPLANT
TIP SUCTION STANDARD PLASTIC TUBE FLEXIBLE TRANSPARENT SLIP RESISTANT (Suction) IMPLANT
TOWEL L27 IN X W17 IN COTTON PREWASH (Other) IMPLANT
TOWEL L27 IN X W17 IN COTTON PREWASH DELINT HIGH ABSORBENT BLUE (Other) IMPLANT
TOWEL SRG CTTN 27X17IN LF STRL PREWASH (Other)
TRAY SRG GEN LAPAROSCOPY IFMC (Pack) ×1
TRAY SURESTEP CATH DRN LF 14FR (Catheter Urine) IMPLANT
TRAY SURESTEP FOLEY CATH 16F (Catheter Urine)
TRAY SURESTEP FOLEY CATH16F (Catheter Urine) IMPLANT
TROCAR LAPAROSCOPIC BLADELESS STABILITY SLEEVE L75 MM OD5 MM ENDOPATH (Laparoscopy Supplies) ×1 IMPLANT
TROCAR LAPAROSCOPIC SMOOTH SLEEVE BLUNT TIP OBTURATOR PISTOL HANDLE (Laparoscopy Supplies) ×1 IMPLANT
TROCAR LAPAROSCOPIC SMOOTH SLV BLNT  12MM (Laparoscopy Supplies) ×1 IMPLANT
TROCAR LAPAROSCOPIC STABILITY SLEEVE (Laparoscopy Supplies) ×1 IMPLANT
TROCAR LAPSCP EPTH XCL 12MM 100MM STRL (Laparoscopy Supplies) ×2
TROCAR LAPSCP EPTH XCL 5MM 75MM LF STRL (Laparoscopy Supplies) ×2
TUBING CONNECTING STERILE 10FT (Tubing)
TUBING SCT IRR (Suction)
TUBING SCT PVC ARG 3/16IN 10FT LF STRL (Tubing)
TUBING SUCTION ID3/16 IN L10 FT (Tubing) IMPLANT
TUBING SUCTION ID3/16 IN L10 FT NONCONDUCTIVE STRAIGHT MALE FEMALE (Tubing) IMPLANT

## 2019-07-19 NOTE — ED Provider Notes (Addendum)
1503I Saw the patient with the midlevel provider (rhee).  Patient has been complaining of abdominal pain for the past 3 days that got acutely worse today.  She is nauseous and vomited one time and is hypotensive in the emergency department in the 60s initially and now on recheck with the nurse's 90/60s.  2 large-bore IVs are infusing fluids.    Bedside sono was limited and that the patient has significant pain in the abdomen no free fluid right upper quadrant or left upper quadrant.  She has a lot of gas in the abdomen and significant tenderness on exam that is moderately intense with a distended abdomen and could not visualize the aorta easily.  Patient is en route to CT scan abdomen pelvis.  Most likely the patient has a perforated viscus.    Discussed the case with the radiologist at 1530 and he notes  Ct abd- Internal hernia with contained perf in small bowel.      On reevaluation the patient family saying that she is been doing with months worth of abdominal pain which got acutely worse today with nausea and vomiting.  She has had no blood or black in her stool.  Her blood pressure is 129 systolic at 1530.  Stat call to general surgery has been made for the perforated viscus.  IV antibiotics have been ordered    1602-discussed with Dr. Eustace Quail who will take the patient to the OR small bowel perforation    Critical care: 45 minutes to treat and prevent deterioration of at least 1 organ system in a patient with small bowel perforation and hypotension requiring emergent operative intervention    Diagnosis    Small bowel perforation  Hypotension       Espn Zeman, Antony Contras, MD  07/23/19 [id number]       Lucille Passy, MD  07/27/19 0422

## 2019-07-19 NOTE — ED Provider Notes (Addendum)
History     Chief Complaint   Patient presents with    Abdominal Pain    Fatigue    Dizziness     [name] is a 69 y.o. female otherwise healthy here for evaluation of intermittent lower abdominal pain that started last night.  Patient has had this before over the past couple months but this dissipated.  Has had nausea and vomiting once.  Status post cholecystectomy hysterectomy and appendectomy  Patient has been tired more lately and has had some intermittent dizziness with walking.   Patient has past history of diverticulitis.  Patient has a GI doctor and family doctor [place]  Number also states that she has had 2 family members passed away due to Covid  Allergies to penicillin  Family states that she has had no other complaints of any shortness of breath chest pain vomiting urinary symptoms or other concerns             Past Medical History:   Diagnosis Date    Atrial fibrillation     Diabetes mellitus     Disorder of thyroid     Hypertension     Hypothyroid        Past Surgical History:   Procedure Laterality Date    APPENDECTOMY      BREAST LUMPECTOMY Left 2018    CHOLECYSTECTOMY      EXPLORATORY LAPAROTOMY N/A 07/19/2019    Procedure: EXPLORATORY LAPAROTOMY, SMALL BOWEL RESECTION X 1;  Surgeon: Talbert Nan, MD;  Location: [facility];  Service: General;  Laterality: N/A;    HYSTERECTOMY         Family History   Problem Relation Age of Onset    Breast cancer Neg Hx        Social  Social History     Tobacco Use    Smoking status: Never Smoker    Smokeless tobacco: Never Used   Vaping Use    Vaping Use: Never used   Substance Use Topics    Alcohol use: Never    Drug use: Never       .     Allergies   Allergen Reactions    Penicillins      Pt states she collapses    Sulfa Antibiotics Itching       Home Medications     Med List Status: Complete Set By: Darla Lesches, RN at 07/19/2019  4:49 PM                cyanocobalamin 1000 MCG tablet     Take 1,000 mcg by  mouth     dilTIAZem (CARDIZEM) 30 MG tablet     Take 30 mg by mouth 2 (two) times daily     Empagliflozin-metFORMIN HCl (Synjardy) 05-998 MG Tab     Take 1 tablet by mouth     famotidine (PEPCID) 40 MG tablet     Take 40 mg by mouth     insulin detemir (Levemir FlexTouch) 100 UNIT/ML injection pen     INJECT 50 UNITS EVERY MORNING AND 5 UNITS EACH EVENING     levothyroxine (SYNTHROID) 50 MCG tablet     Take 50 mcg by mouth Once a day at 6:00am     losartan (COZAAR) 50 MG tablet     Take 50 mg by mouth daily     omeprazole (PriLOSEC) 10 MG capsule     Take 10 mg by mouth daily  simvastatin (ZOCOR) 20 MG tablet     Take 20 mg by mouth nightly     vitamin D (CHOLECALCIFEROL) 25 MCG (1000 UT) tablet     Take 2,000 Units by mouth daily           Review of Systems   Constitutional: Negative for fever.   HENT: Negative for congestion.    Eyes: Negative for visual disturbance.   Respiratory: Negative for cough, shortness of breath and wheezing.    Cardiovascular: Negative for chest pain and leg swelling.   Gastrointestinal: Positive for abdominal pain, diarrhea, nausea and vomiting.   Genitourinary: Negative for dysuria, flank pain and hematuria.   Musculoskeletal: Negative for arthralgias.   Skin: Negative for rash.   Neurological: Positive for light-headedness. Negative for headaches.       Physical Exam    BP: 102/57, Heart Rate: 74, Temp: 97.9 F (36.6 C), Resp Rate: 22, SpO2: 93 %, Weight: 65.4 kg    Physical Exam  Vitals and nursing note reviewed.   Constitutional:       General: She is in acute distress.      Appearance: She is ill-appearing.   HENT:      Head: Normocephalic and atraumatic.      Mouth/Throat:      Mouth: Mucous membranes are moist.   Cardiovascular:      Rate and Rhythm: Normal rate and regular rhythm.   Pulmonary:      Effort: Pulmonary effort is normal. No respiratory distress.   Abdominal:      Comments: Lower abd tenderness to palpation, no upper abd pain, neg murphys, no focal pain at  mcburneys point, rigidity, rebound, rigidity, no distension   Skin:     General: Skin is warm.   Neurological:      General: No focal deficit present.      Mental Status: She is alert and oriented to person, place, and time.      Cranial Nerves: No cranial nerve deficit.   Psychiatric:         Mood and Affect: Mood normal.           MDM and ED Course     ED Medication Orders (From admission, onward)    Start Ordered     Status Ordering Provider    07/19/19 1613 07/19/19 1612  morphine injection 4 mg  Once     Route: Intravenous  Ordered Dose: 4 mg     Last MAR action: Given LATIF, AAMIR A    07/19/19 1540 07/19/19 1539  sodium chloride 0.9 % bolus 1,000 mL  Once     Route: Intravenous  Ordered Dose: 1,000 mL     Last MAR action: New Bag LATIF, AAMIR A    07/19/19 1530 07/19/19 1529  cefepime (MAXIPIME) 2 g in sodium chloride 0.9 % 100 mL IVPB mini-bag plus  Once     Route: Intravenous  Ordered Dose: 2 g     Last MAR action: Stopped LATIF, AAMIR A    07/19/19 1530 07/19/19 1529  metroNIDAZOLE (FLAGYL) 500 mg IVPB (premix)  Once     Route: Intravenous  Ordered Dose: 500 mg     Last MAR action: New Bag LATIF, AAMIR A    07/19/19 1521 07/19/19 1520  sodium chloride 0.9 % bolus 1,000 mL  Once     Route: Intravenous  Ordered Dose: 1,000 mL     Last MAR action: Stopped LATIF, AAMIR A    07/19/19 1447  07/19/19 1447  sodium chloride 0.9 % bolus 1,000 mL  Once     Route: Intravenous  Ordered Dose: 1,000 mL     Last MAR action: Stopped Devery Odwyer             MDM  Number of Diagnoses or Management Options  Diagnosis management comments: Patient here for evaluation of a day history of lower abdominal pain.  Has guarding rigidity on bedside exam.  Patient has recent past history of diverticulitis.  Has had some lightheadedness as well.  On initial bedside exam was noted that patient had a systolic pressure in the 60s  2 lines were placed and given 2 L of fluids.  Discussed with attending physician who saw patient at the bedside.   Had bedside ultrasound performed  No respiratory distress.  Blood sugar noted to be in the low 100s  Obtain screening labs, check cardiac enzymes, EKG, give fluids, obtain lactic acid blood cultures obtain CT abdomen pelvis and monitor    Patient turned over to Dr. Lourdes Sledge                   Procedures    Clinical Impression & Disposition     Clinical Impression  Final diagnoses:   Internal hernia        ED Disposition     ED Disposition Condition Date/Time Comment    Send to OR  Sat Jul 19, 2019  4:04 PM            Discharge Medication List as of 07/25/2019  1:34 PM      START taking these medications    Details   acetaminophen (TYLENOL) 325 MG tablet Take 2 tablets (650 mg total) by mouth every 6 (six) hours, Starting Fri 07/25/2019, No Print      oxyCODONE (ROXICODONE) 5 MG immediate release tablet Take 1 tablet (5 mg total) by mouth every 6 (six) hours as needed for Pain, Starting Fri 07/25/2019, Until Fri 08/01/2019 at 2359, Valinda Party, PA  07/19/19 2154       Nettie Elm, PA  07/28/19 1546       Nettie Elm, [place]  07/28/19 1547

## 2019-07-19 NOTE — Anesthesia Postprocedure Evaluation (Signed)
Anesthesia Post Evaluation    Patient: [name]    Procedure(s):  EXPLORATORY LAPAROTOMY, SMALL BOWEL RESECTION X 1    Anesthesia type: general    Last Vitals:   Vitals Value Taken Time   BP 115/58 07/19/19 1840   Temp 36.8 C (98.3 F) 07/19/19 1834   Pulse 97 07/19/19 1840   Resp 36 07/19/19 1840   SpO2 91 % 07/19/19 1840                 Anesthesia Post Evaluation:     Patient Evaluated: bedside  Patient Participation: complete - patient participated  Level of Consciousness: awake and alert    Pain Management: adequate    Airway Patency: patent    Anesthetic complications: No      PONV Status: none    Cardiovascular status: acceptable  Respiratory status: acceptable  Hydration status: acceptable        Signed by: Laurence Ferrari, 07/19/2019 6:47 PM

## 2019-07-19 NOTE — ED Notes (Signed)
SEPSIS ADMISSION COMMUNICATION NOTE    ED SEPSIS INITIATION TIME (SIRS+ SUSPECTED INFECTION or MD ORDERS SEPSIS WORKUP):  14:47       SEVERE SEPSIS CRITERIA (SEPSIS + End Organ Dysfunction)   1. 2 or more SIRS (Temperature >100.4 or < 96.8 F, HR >90 BPM, Resp Rate > 20 breaths/min, WBC >12,000 or < 4,000 per uL or bands > 10%): Yes     2. Infection Known or Suspected (urine, cellulitis, pneumonia, unknown, etc.): Yes     3. End Organ Dysfunction one of the following: Lactate > 2.0, INR > 1.5, PLT < 100k, Bili > 2.0, Cr > 2.0, Intubation/C/BiPAP, or SBP < 90):    Platelets   Date/Time Value Ref Range Status   07/19/2019 02:48 PM 312 142 - 346 x10 3/uL Final     Creatinine   Date/Time Value Ref Range Status   07/19/2019 02:48 PM 1.1 (H) 0.6 - 1.0 mg/dL Final     Bilirubin, Total   Date/Time Value Ref Range Status   07/19/2019 02:48 PM 0.3 0.2 - 1.2 mg/dL Final     Lactic Acid   Date/Time Value Ref Range Status   07/19/2019 03:29 PM 2.6 (H) 0.2 - 2.0 mmol/L Final     Comment:     Result given to [id number] by [id number];readback and confirmed. 15:52  07/19/2019       Yes - Lactic 2.6     SEVERE SEPSIS TIME ZERO is earliest time YES to all three components within 6 hours of each other.  = What time were all three present (SEVERE SEPSIS):   ( If patient NOT with SEVERE SEPSIS, Receiving RN should watch for End Organ Dysfunction or hypotension while admitted and request order for new lactic, and check that required Severe Sepsis Bundle completed) 15:52         REQUIRED BUNDLE WITHIN 3hrs of SEVERE SEPSIS TIME ZERO   Lactic Acid #1:  Yes   Blood cultures x2:  Draw and send to lab before starting antibiotics. One BCx prior to antibiotics required for core measure. Two BCx recommended Yes   Antibiotics started (Recommended within 1hr of Severe Sepsis time zero):  Give ordered broad spectrum first. Hang vancomycin last. Yes     Lactic Acid #2:  Required <6hr if initial lactic greater than 2. Redraw 1-2hr after first lactic.    Yes    If not drawn, communicated to, Receiving RN and Time Due.   (Receiving nurse must obtain new lactic order, if again greater than 2, reorder in less than 6 hours) Due 17:29         IV FLUID BOLUS 30ML/KG (<3hrs) GIVEN?  (REQUIRED for SBP <90 or MAP <65 or  LACTIC ? 4.0).  IV Fluid 65ml/kg = 30x65.4 kg   Yes - 3L   Medications   cefepime (MAXIPIME) 2 g in sodium chloride 0.9 % 100 mL IVPB mini-bag plus (2 g Intravenous New Bag 07/19/19 1537)   metroNIDAZOLE (FLAGYL) 500 mg IVPB (premix) (has no administration in time range)   sodium chloride 0.9 % bolus 1,000 mL (1,000 mLs Intravenous New Bag 07/19/19 1539)   sodium chloride 0.9 % bolus 1,000 mL (0 mLs Intravenous Stopped 07/19/19 1519)   sodium chloride 0.9 % bolus 1,000 mL (0 mLs Intravenous Stopped 07/19/19 1533)      BP CHECKED x2 within 60 MIN AFTER BOLUS ENDS  Yes       REQUIRED SEPTIC SHOCK BUNDLE  (Persistent hypotension (SBP <90 or MAP <  65) after 52ml/kg fluid bolus OR LACTIC ? 4.0).   START VASOPRESSORS (<6 hrs)  (REQUIRED IF 2 CONSECTUTIVE SBP <90 or MAP <65 in the hour following completion of IVF Bolus) N/A

## 2019-07-19 NOTE — Consults (Signed)
[facility]  MEDICINE CONSULT NOTE      Patient: [name]  Date: 07/19/2019   DOB: 01-17-1951  Admission Date: 07/19/2019   MRN: [id number]  Attending: No att. providers found       Reason for Consult: medical management of DM , HTN , hypothyroidism   Requesting Physician: No att. providers found  Consulting Physician:Tenise Stetler Leonides Sake, MD  History Gathered From:self ,  daughter , son in Engineer, building services Complaint   Patient presents with    Abdominal Pain    Fatigue    Dizziness      HISTORY AND PHYSICAL     [name] is a 69 y.o. female with a PMHx of DM Type 2, HTN , Hypothyroidism  who presented with abdominal pain and found to have internal hernia with perforation and was taken to OR for exploratory laparotomy . She was found to have jejunal diverticulitis with perforation .   She has H.o appendectomy , cholecystectomy and hysterectomy . Denies recent fevers, chills, chest pain, sob . She had some nausea this morning associated with abdominal pain but no diarrhea .  Medicine was consulted for management of her Diabetes, Hypertension , Atrial fibrillation ,hypothyrosisim     Past Medical History:   Diagnosis Date    Atrial fibrillation     Diabetes mellitus     Disorder of thyroid     Hypertension     Hypothyroid        Past Surgical History:   Procedure Laterality Date    APPENDECTOMY      BREAST LUMPECTOMY Left 2018    CHOLECYSTECTOMY      HYSTERECTOMY         Prior to Admission medications    Medication Sig Start Date End Date Taking? Authorizing Provider   cyanocobalamin 1000 MCG tablet Take 1,000 mcg by mouth 04/29/19 04/28/20 Yes [provider]   dilTIAZem (CARDIZEM) 30 MG tablet Take 30 mg by mouth 2 (two) times daily   Yes [provider]   Empagliflozin-metFORMIN HCl (Synjardy) 05-998 MG Tab Take 1 tablet by mouth 04/14/19  Yes [provider]   famotidine (PEPCID) 40 MG tablet Take 40 mg by mouth 04/14/19  Yes [provider]   insulin  detemir (Levemir FlexTouch) 100 UNIT/ML injection pen INJECT 50 UNITS EVERY MORNING AND 5 UNITS EACH EVENING 04/20/19  Yes [provider]   levothyroxine (SYNTHROID) 50 MCG tablet Take 50 mcg by mouth Once a day at 6:00am   Yes [provider]   losartan (COZAAR) 50 MG tablet Take 50 mg by mouth daily   Yes [provider]   omeprazole (PriLOSEC) 10 MG capsule Take 10 mg by mouth daily   Yes [provider]   simvastatin (ZOCOR) 20 MG tablet Take 20 mg by mouth nightly   Yes [provider]   vitamin D (CHOLECALCIFEROL) 25 MCG (1000 UT) tablet Take 2,000 Units by mouth daily   Yes [provider]       Allergies   Allergen Reactions    Penicillins      Pt states she collapses    Sulfa Antibiotics Itching       CODE STATUS: full code    PRIMARY CARE MD: Pcp, None, MD    Family History   Problem Relation Age of Onset    Breast cancer Neg Hx        Social History  Tobacco Use    Smoking status: Never Smoker    Smokeless tobacco: Never Used   Vaping Use    Vaping Use: Never used   Substance Use Topics    Alcohol use: Never    Drug use: Never       REVIEW OF SYSTEMS     Ten point review of systems negative or as per HPI and below endorsements.    PHYSICAL EXAM     Vital Signs (most recent): BP 132/56    Pulse 97    Temp (!) 96.6 F (35.9 C) (Temporal)    Resp 16    Ht 1.575 m (5\' 2" )    Wt 65.4 kg (144 lb 2.9 oz)    SpO2 100%    BMI 26.37 kg/m   Constiutional: Mild distress due to pain , ill looking Patient speaks freely in full sentences.   HEENT: PERRL, no scleral icterus or conjunctival pallor, no nasal discharge, MMM, oropharynx without erythema or exudate  Neck: trachea midline, supple, no cervical or supraclavicular lymphadenopathy or masses  Cardiovascular: RRR, normal S1 S2, no murmurs  Respiratory: Normal rate. No retractions or increased work of breathing. Clear to auscultation and percussion bilaterally.  Gastrointestinal:soft, appropriate  tenderness , Dressing intact   Musculoskeletal: ROM and motor strength grossly normal. No clubbing, edema, or cyanosis. DP and radial pulses 2+ and symmetric. Normal capillary refill  Skin: no rashes, jaundice or other lesions  Neurologic: EOMI, CN 2-12 grossly intact. no gross motor or sensory deficits  Psychiatric: affect and mood appropriate. The patient is alert, interactive, appropriate.    LABS & IMAGING     Recent Results (from the past 24 hour(s))   CBC and differential    Collection Time: 07/19/19  2:48 PM   Result Value Ref Range    WBC 9.60 (H) 3.1 - 9.5 x10 3/uL    Hgb 13.1 11.4 - 14.8 g/dL    Hematocrit 16.1 09.6 - 43.7 %    Platelets 312 142 - 346 x10 3/uL    RBC 5.11 (H) 3.90 - 5.10 x10 6/uL    MCV 80.8 78.0 - 96.0 fL    MCH 25.6 25.1 - 33.5 pg    MCHC 31.7 31 - 35 g/dL    RDW 14 04.5 - 40.9 %    MPV 9.6 8.9 - 12.5 fL    Neutrophils 64.5 None %    Lymphocytes Automated 26.7 None %    Monocytes 6.6 None %    Eosinophils Automated 1.6 None %    Basophils Automated 0.2 None %    Immature Granulocytes 0.4 None %    Nucleated RBC 0.0 0.0 - 0.0 /100 WBC    Neutrophils Absolute 6.20 1 - 6 x10 3/uL    Lymphocytes Absolute Automated 2.56 0.42 - 3.22 x10 3/uL    Monocytes Absolute Automated 0.63 0.21 - 0.85 x10 3/uL    Eosinophils Absolute Automated 0.15 0.00 - 0.44 x10 3/uL    Basophils Absolute Automated 0.02 0.00 - 0.08 x10 3/uL    Immature Granulocytes Absolute 0.04 0.00 - 0.07 x10 3/uL    Absolute NRBC 0.00 0.00 - 0.00 x10 3/uL   Comprehensive metabolic panel    Collection Time: 07/19/19  2:48 PM   Result Value Ref Range    Glucose 122 (H) 70 - 100 mg/dL    BUN 19 7 - 19 mg/dL    Creatinine 1.1 (H) 0.6 - 1.0 mg/dL    Sodium 811 914 - 782  mEq/L    Potassium 4.0 3.5 - 5.1 mEq/L    Chloride 104 100 - 111 mEq/L    CO2 23 22 - 29 mEq/L    Calcium 9.8 8.5 - 10.5 mg/dL    Protein, Total 6.8 6.0 - 8.3 g/dL    Albumin 3.8 3.5 - 5.0 g/dL    AST (SGOT) 15 5 - 34 U/L    ALT 12 0 - 55 U/L    Alkaline Phosphatase 61 37  - 106 U/L    Bilirubin, Total 0.3 0.2 - 1.2 mg/dL    Globulin 3.0 2.0 - 3.6 g/dL    Albumin/Globulin Ratio 1.3 0.9 - 2.2    Anion Gap 12.0 5 - 15   Lipase    Collection Time: 07/19/19  2:48 PM   Result Value Ref Range    Lipase 36 8 - 78 U/L   GFR    Collection Time: 07/19/19  2:48 PM   Result Value Ref Range    EGFR 49.2    Troponin I    Collection Time: 07/19/19  2:48 PM   Result Value Ref Range    Troponin I <0.01 0.00 - 0.05 ng/mL   Glucose Whole Blood - POCT    Collection Time: 07/19/19  2:49 PM   Result Value Ref Range    Whole Blood Glucose POCT 102 (H) 70 - 100 mg/dL   Lactic Acid    Collection Time: 07/19/19  3:29 PM   Result Value Ref Range    Lactic Acid 2.6 (H) 0.2 - 2.0 mmol/L   Type and Screen    Collection Time: 07/19/19  3:29 PM   Result Value Ref Range    ABO Rh A POS     AB Screen Gel NEG    Glucose Whole Blood - POCT    Collection Time: 07/19/19  4:47 PM   Result Value Ref Range    Whole Blood Glucose POCT 97 70 - 100 mg/dL       IMAGING:  CT Abd/ Pelvis without Contrast   Final Result    Swirling mesenteric vessels suggestive of an internal   hernia. There is jejunal thickening and adjacent mesenteric edema with   suspected contained perforation or abscess. Critical results discussed   with Dr. Lourdes Sledge at 3:25 PM.      Bosie Helper, MD    07/19/2019 3:32 PM          ASSESSMENT & PLAN     [name] is a 69 y.o. female admitted with abdominal pain due to jejunal diverticulitis  with perforation     1.  Jejunal diverticulitis with perforation   S/p exploratory laparotomy , small bowel resection   Management per surgery       2. DM type 2   She takes levemir , Empagliflozin- Metformin at home   Since she is  NPO at present , start SSI   Resume basal insulin when PO initiated and adjust insulin as needed   accucheks every 4 hrs while NPO   Check  HBA1c    3. Hypothyroidism   Resume synthroid when able     4. GERD   Start PPI IV daily     5. HTN   BP soft at this time.   Hold BP meds for now   Watch  BP closely       Thank for for the opportunity to be involved in [name]'s care. We will continue to follow.  Signed,  Cindee Lame, MD  07/19/2019 5:22 PM

## 2019-07-19 NOTE — Anesthesia Preprocedure Evaluation (Signed)
Anesthesia Evaluation    AIRWAY    Mallampati: III    TM distance: >3 FB  Neck ROM: full  Mouth Opening:limited  Planned to use difficult airway equipment: Yes CARDIOVASCULAR    cardiovascular exam normal       DENTAL    no notable dental hx     PULMONARY    pulmonary exam normal     OTHER FINDINGS                  Relevant Problems   No relevant active problems               Anesthesia Plan    ASA 2 - emergent     general                     intravenous induction   Detailed anesthesia plan: general endotracheal      Post Op: plan for postoperative opioid use    Post op pain management: per surgeon    informed consent obtained    ECG reviewed  pertinent labs reviewed             Signed by: Laurence Ferrari 07/19/19 5:07 PM

## 2019-07-19 NOTE — Progress Notes (Signed)
Admission Note-   Patient admitted to Surgical unit at 2020. Hand off report received from Pacu RN- Annabelle.    Patient and family at bedside oriented to room, bed in lowest position, bed alarm activated, call bell within reach.    Fall prevention contract completed. Yes   Patient informed of visitor policy.   Belongings with family     Brief Clinical Picture:  Neuro A&Ox4 Resp - O2 status 2L nc  Skin Assessment  Two nurse skin assessment performed with: Myriam Jacobson RN    Areas observed with redness or injury: None  Braden score <15 No    Patient arrived to the floor alert and oriented x4, vss, pain minimal s/p bowel resection. NG tube in place to lcs- no output at this time. Denies nausea, abdominal dressing cdi.

## 2019-07-19 NOTE — Brief Op Note (Signed)
BRIEF OP NOTE    Date Time: 07/19/19 6:29 PM    Patient Name:   [name]    Date of Operation:   07/19/2019    Providers Performing:   Surgeon(s):  Darlin Stenseth, Lupita Raider, MD    Assistant (s):   Circulator: Shirlyn Goltz, RN  Scrub Person: Ulysees Barns D  First Assistant: Tomma Rakers    Operative Procedure:   Procedure(s):  EXPLORATORY LAPAROTOMY, SMALL BOWEL RESECTION X 1    Preoperative Diagnosis:   Pre-Op Diagnosis Codes:     * Internal hernia [K45.8]    Postoperative Diagnosis:   Post-Op Diagnosis Codes:     * Internal hernia [K45.8]    Anesthesia:   General    Estimated Blood Loss:    * No values recorded between 07/19/2019  5:15 PM and 07/19/2019  6:29 PM *    Implants:   * No implants in log *    Drains:   Drains: no    Specimens:     ID Type Source Tests Collected by Time Destination   A :  Not Applicable Small Bowel SURGICAL PATHOLOGY Talbert Nan, MD 07/19/2019 1801          Findings:   Jejunal diveritculitis w perf    Complications:   None      Signed by: Talbert Nan, MD                                                                           [place] [facility]

## 2019-07-19 NOTE — ED Notes (Signed)
[facility] EMERGENCY DEPARTMENT  ED NURSING NOTE FOR THE RECEIVING INPATIENT NURSE   ED NURSE [place] [phone]   ED CHARGE RN Jae Dire, [id number]   ADMISSION INFORMATION   [name] is a 69 y.o. female admitted with an ED diagnosis of:    No diagnosis found.     Isolation: None   Allergies: Penicillins   Holding Orders confirmed? Yes   Belongings Documented? Yes   Home medications sent to pharmacy confirmed? N/A   NURSING CARE   Patient Comes From:   Mental Status: Home  Alert   ADL: Independent   Ambulation: Independent at home   Pertinent Information  and Safety Concerns: High Falls Risk     CT / NIH   CT Head ordered on this patient?  N/A   NIH/Dysphagia assessment done prior to admission? N/A   VITAL SIGNS (at the time of this note)      Vitals:    07/19/19 1540   BP: 123/58   Pulse: 100   Resp: 20   Temp: 97.9 F (36.6 C)   SpO2: 100%

## 2019-07-19 NOTE — H&P (Signed)
CONSULTATION REPORT  [facility] Spectralink: [id number]      Consultation Date Time: 07/19/19 4:35 PM  Date of Admission: 07/19/2019     Requesting Physician: Lucille Passy, MD  Consulting Physician: Antonieta Loveless M.D.  Consulting Service: General Surgery    Reason For Consultation:  Abdominal Pain      Impression:   There is no problem list on file for this patient.      Plan:     [phone] with findings concerning for internal hernia w perf, will take to [facility] for exploration    The patient's relevant radiologic and laboratory studies were carefully reviewed    This plan was discussed with the patient as well as with the referring physician.     History of Present Illness:   [name] is a 69 y.o. female who  has a past medical history of Atrial fibrillation, Diabetes mellitus, Disorder of thyroid, Hypertension, and Hypothyroid..  She presented to the hospital with complaints of abdominal pain.  The pain is described as cramping, and is located in the epigastric without radiation. Onset was 1 month ago. Symptoms have been rapidly worsening since. Aggravating factors: none.  Alleviating factors: none. Associated symptoms: nausea. The patient denies diarrhea.    Past Medical History:     Past Medical History:   Diagnosis Date    Atrial fibrillation     Diabetes mellitus     Disorder of thyroid     Hypertension     Hypothyroid        Past Surgical History:     Past Surgical History:   Procedure Laterality Date    APPENDECTOMY      CHOLECYSTECTOMY      HYSTERECTOMY         Family History:     Family History   Problem Relation Age of Onset    Breast cancer Neg Hx        Social History:     Social History     Socioeconomic History    Marital status: Married     Spouse name: Not on file    Number of children: Not on file    Years of education: Not on file    Highest education level: Not on file   Occupational History    Not on file   Tobacco Use    Smoking status: Never Smoker   Vaping Use    Vaping Use: Never used    Substance and Sexual Activity    Alcohol use: Never    Drug use: Never    Sexual activity: Not on file   Other Topics Concern    Not on file   Social History Narrative    Not on file     Social Determinants of Health     Financial Resource Strain:     Difficulty of Paying Living Expenses:    Food Insecurity:     Worried About Programme researcher, broadcasting/film/video in the Last Year:     Barista in the Last Year:    Transportation Needs:     Freight forwarder (Medical):     Lack of Transportation (Non-Medical):    Physical Activity:     Days of Exercise per Week:     Minutes of Exercise per Session:    Stress:     Feeling of Stress :    Social Connections:     Frequency of Communication with Friends and Family:  Frequency of Social Gatherings with Friends and Family:     Attends Religious Services:     Active Member of Clubs or Organizations:     Attends Banker Meetings:     Marital Status:    Intimate Partner Violence:     Fear of Current or Ex-Partner:     Emotionally Abused:     Physically Abused:     Sexually Abused:        Allergies:     Allergies   Allergen Reactions    Penicillins      Pt states she collapses       Medications:     Prior to Admission medications    Medication Sig Start Date End Date Taking? Authorizing Provider   dilTIAZem (CARDIZEM) 30 MG tablet Take 30 mg by mouth 2 (two) times daily   Yes [provider]   levothyroxine (SYNTHROID) 50 MCG tablet Take 50 mcg by mouth Once a day at 6:00am   Yes [provider]   losartan (COZAAR) 50 MG tablet Take 50 mg by mouth daily   Yes [provider]   omeprazole (PriLOSEC) 10 MG capsule Take 10 mg by mouth daily   Yes [provider]   simvastatin (ZOCOR) 20 MG tablet Take 20 mg by mouth nightly   Yes [provider]   vitamin D (CHOLECALCIFEROL) 25 MCG (1000 UT) tablet Take 2,000 Units by mouth daily   Yes [provider]       Review of Systems:   As per the HPI.   The patient denies any additional changes to their otic, opthalmologic, dermatologic, pulmonary, cardiac, gastrointestinal, genitourinary, musculoskeletal, hematologic, constitutional, or psychiatric systems.      Physical Exam:     Vitals:    07/19/19 1600   BP: 125/57   Pulse: 97   Resp: 18   Temp:    SpO2: 99%     Gen: NAD  HEENT: Anicteric sclera  Neuro: AAOx3  Resp: Chest rise symmetric, no respiratory distress  CV: No JVD  Abd: Soft, diffusely tender  Skin: No rashes  Ext: warm, well perfused        Labs:     Results     Procedure Component Value Units Date/Time    Type and Screen [[id number]] Collected: 07/19/19 1529    Specimen: Blood Updated: 07/19/19 1616     ABO Rh A POS     AB Screen Gel NEG    Troponin I [[id number]] Collected: 07/19/19 1448     Updated: 07/19/19 1553     Troponin I <0.01 ng/mL     Narrative:      Lower abdominal pain    Lactic Acid [[id number]]  (Abnormal) Collected: 07/19/19 1529    Specimen: Blood Updated: 07/19/19 1552     Lactic Acid 2.6 mmol/L     Blood Culture Aerobic/Anaerobic #2 [[id number]] Collected: 07/19/19 1533    Specimen: Arm from Blood, Venipuncture Updated: 07/19/19 1534    Narrative:      1 BLUE+1 PURPLE    Blood Culture Aerobic/Anaerobic #1 [[id number]] Collected: 07/19/19 1529    Specimen: Arm from Blood, Venipuncture Updated: 07/19/19 1529    Narrative:      1 BLUE+1 PURPLE    Comprehensive metabolic panel [[id number]]  (Abnormal) Collected: 07/19/19 1448    Specimen: Blood Updated: 07/19/19 1514     Glucose 122 mg/dL      BUN 19 mg/dL      Creatinine 1.1 mg/dL  Sodium 139 mEq/L      Potassium 4.0 mEq/L      Chloride 104 mEq/L      CO2 23 mEq/L      Calcium 9.8 mg/dL      Protein, Total 6.8 g/dL      Albumin 3.8 g/dL      AST (SGOT) 15 U/L      ALT 12 U/L      Alkaline Phosphatase 61 U/L      Bilirubin, Total 0.3 mg/dL      Globulin 3.0 g/dL      Albumin/Globulin Ratio 1.3     Anion Gap 12.0    Narrative:      Lower abdominal pain    Lipase [[id number]] Collected:  07/19/19 1448    Specimen: Blood Updated: 07/19/19 1514     Lipase 36 U/L     Narrative:      Lower abdominal pain    GFR [[id number]] Collected: 07/19/19 1448     Updated: 07/19/19 1514     EGFR 49.2    Narrative:      Lower abdominal pain    CBC and differential [[id number]]  (Abnormal) Collected: 07/19/19 1448    Specimen: Blood Updated: 07/19/19 1458     WBC 9.60 x10 3/uL      Hgb 13.1 g/dL      Hematocrit 95.2 %      Platelets 312 x10 3/uL      RBC 5.11 x10 6/uL      MCV 80.8 fL      MCH 25.6 pg      MCHC 31.7 g/dL      RDW 14 %      MPV 9.6 fL      Neutrophils 64.5 %      Lymphocytes Automated 26.7 %      Monocytes 6.6 %      Eosinophils Automated 1.6 %      Basophils Automated 0.2 %      Immature Granulocytes 0.4 %      Nucleated RBC 0.0 /100 WBC      Neutrophils Absolute 6.20 x10 3/uL      Lymphocytes Absolute Automated 2.56 x10 3/uL      Monocytes Absolute Automated 0.63 x10 3/uL      Eosinophils Absolute Automated 0.15 x10 3/uL      Basophils Absolute Automated 0.02 x10 3/uL      Immature Granulocytes Absolute 0.04 x10 3/uL      Absolute NRBC 0.00 x10 3/uL     Narrative:      Lower abdominal pain    Glucose Whole Blood - POCT [[id number]]  (Abnormal) Collected: 07/19/19 1449     Updated: 07/19/19 1451     Whole Blood Glucose POCT 102 mg/dL           Rads:     Radiology Results (24 Hour)     Procedure Component Value Units Date/Time    CT Abd/ Pelvis without Contrast [[id number]] Collected: 07/19/19 1525    Order Status: Completed Updated: 07/19/19 1535    Narrative:      HISTORY: Abdominal pain.    TECHNIQUE: Noncontrast CT abdomen/pelvis. The following dose reduction  techniques were utilized: Automated exposure control and/or adjustment  of the mA and/or kV according to patient size, and the use of iterative  reconstruction technique.    COMPARISON: None.    FINDINGS: There is a swirling pattern to the upper abdominal mesenteric  vessels suggestive of an internal hernia.  While the bowel loops are   suboptimally evaluated without oral contrast, the proximal jejunal loops  are thickened with mild mesenteric fluid and edema. Just posterior to  one of the small bowel loops, there is a 3.7 x 2.4 cm mottled debris  collection suspicious for a contained perforation. This does not appear  to have a significant wall to suggest a giant diverticulum. A mottled  appearance to the ascending colon is likely due to stool rather than  pneumatosis. There is no bowel obstruction, free air, drainable fluid  collection or adenopathy.    The gallbladder has been resected. Noncontrast images of the liver,  spleen, pancreas and adrenal glands and kidneys are normal. The uterus  is not identified. There is no suspicious adnexal abnormality within the  limits of CT. The abdominal aorta is normal caliber.      Impression:       Swirling mesenteric vessels suggestive of an internal  hernia. There is jejunal thickening and adjacent mesenteric edema with  suspected contained perforation or abscess. Critical results discussed  with Dr. Lourdes Sledge at 3:25 PM.    Bosie Helper, MD   07/19/2019 3:32 PM          Signed by: Talbert Nan

## 2019-07-19 NOTE — ED Notes (Signed)
Bed: A06  Expected date:   Expected time:   Means of arrival:   Comments:  Medic 421

## 2019-07-19 NOTE — Progress Notes (Signed)
Pt is alert and oriented. Pain is manageable. No nausea noted. NG tube intact. Surgical sites clean and intact no bleeding noted. Pt's daughter and Son with the pt in her room 510. Report given to floor RN, Randa Evens. Dr. Eustace Quail aware that he need to put the post op order. And floor RN, aware. Transfer pt to floor via bed. Pt to floor.

## 2019-07-19 NOTE — Transfer of Care (Signed)
Anesthesia Transfer of Care Note    Patient: [name]    Procedures performed: Procedure(s):  EXPLORATORY LAPAROTOMY, SMALL BOWEL RESECTION X 1    Anesthesia type: General ETT    Patient location:Phase I PACU    Last vitals:   Vitals:    07/19/19 1838   BP: 115/55   Pulse: 98   Resp: (!) 38   Temp:    SpO2: 92%       Post pain: Patient not complaining of pain, continue current therapy      Mental Status:awake    Respiratory Function: tolerating face mask    Cardiovascular: stable    Nausea/Vomiting: patient not complaining of nausea or vomiting    Hydration Status: adequate    Post assessment: no apparent anesthetic complications and no reportable events    Signed by: Joanell Rising  07/19/19 6:38 PM

## 2019-07-19 NOTE — ED Triage Notes (Signed)
BIBA for intermittent lower abdominal pain, onset last night. Hx of similar. +N/V x1 PTA with EMS. +cholecystectomy, hysterectomy, appendectomy. Also endorses fatigue and intermittent dizziness when walking. Pt had a fall earlier today when using bathroom. Denies HA, speech clear. Of note, pt has had increased stress, pt's two brothers recently passed away

## 2019-07-20 DIAGNOSIS — Z980 Intestinal bypass and anastomosis status: Secondary | ICD-10-CM

## 2019-07-20 DIAGNOSIS — I10 Essential (primary) hypertension: Secondary | ICD-10-CM | POA: Diagnosis not present

## 2019-07-20 DIAGNOSIS — K5700 Diverticulitis of small intestine with perforation and abscess without bleeding: Secondary | ICD-10-CM | POA: Diagnosis not present

## 2019-07-20 DIAGNOSIS — E119 Type 2 diabetes mellitus without complications: Secondary | ICD-10-CM | POA: Diagnosis not present

## 2019-07-20 DIAGNOSIS — Z794 Long term (current) use of insulin: Secondary | ICD-10-CM | POA: Diagnosis not present

## 2019-07-20 LAB — ECG 12-LEAD
Atrial Rate: 60 {beats}/min
P Axis: 51 degrees
P-R Interval: 130 ms
Q-T Interval: 400 ms
QRS Duration: 66 ms
QTC Calculation (Bezet): 400 ms
R Axis: 52 degrees
T Axis: 11 degrees
Ventricular Rate: 60 {beats}/min

## 2019-07-20 LAB — CELL MORPHOLOGY
Cell Morphology: NORMAL
Platelet Estimate: NORMAL

## 2019-07-20 LAB — BASIC METABOLIC PANEL
Anion Gap: 10 (ref 5.0–15.0)
BUN: 16 mg/dL (ref 7–19)
CO2: 21 mEq/L — ABNORMAL LOW (ref 22–29)
Calcium: 8.4 mg/dL — ABNORMAL LOW (ref 8.5–10.5)
Chloride: 107 mEq/L (ref 100–111)
Creatinine: 0.9 mg/dL (ref 0.6–1.0)
Glucose: 171 mg/dL — ABNORMAL HIGH (ref 70–100)
Potassium: 4.6 mEq/L (ref 3.5–5.1)
Sodium: 138 mEq/L (ref 136–145)

## 2019-07-20 LAB — CBC AND DIFFERENTIAL
Absolute NRBC: 0 10*3/uL (ref 0.00–0.00)
Hematocrit: 37.1 % (ref 34.7–43.7)
Hgb: 11.8 g/dL (ref 11.4–14.8)
MCH: 25.8 pg (ref 25.1–33.5)
MCHC: 31.8 g/dL (ref 31.5–35.8)
MCV: 81.2 fL (ref 78.0–96.0)
MPV: 9.5 fL (ref 8.9–12.5)
Nucleated RBC: 0 /100 WBC (ref 0.0–0.0)
Platelets: 195 10*3/uL (ref 142–346)
RBC: 4.57 10*6/uL (ref 3.90–5.10)
RDW: 14 % (ref 11–15)
WBC: 9.9 10*3/uL — ABNORMAL HIGH (ref 3.10–9.50)

## 2019-07-20 LAB — MAN DIFF ONLY
Band Neutrophils Absolute: 3.66 10*3/uL — ABNORMAL HIGH (ref 0.00–1.00)
Band Neutrophils: 37 %
Basophils Absolute Manual: 0 10*3/uL (ref 0.00–0.08)
Basophils Manual: 0 %
Eosinophils Absolute Manual: 0 10*3/uL (ref 0.00–0.44)
Eosinophils Manual: 0 %
Lymphocytes Absolute Manual: 0.3 10*3/uL — ABNORMAL LOW (ref 0.42–3.22)
Lymphocytes Manual: 3 %
Monocytes Absolute: 0.4 10*3/uL (ref 0.21–0.85)
Monocytes Manual: 4 %
Neutrophils Absolute Manual: 5.54 10*3/uL (ref 1.10–6.33)
Segmented Neutrophils: 56 %

## 2019-07-20 LAB — GLUCOSE WHOLE BLOOD - POCT
Whole Blood Glucose POCT: 119 mg/dL — ABNORMAL HIGH (ref 70–100)
Whole Blood Glucose POCT: 124 mg/dL — ABNORMAL HIGH (ref 70–100)
Whole Blood Glucose POCT: 128 mg/dL — ABNORMAL HIGH (ref 70–100)
Whole Blood Glucose POCT: 145 mg/dL — ABNORMAL HIGH (ref 70–100)
Whole Blood Glucose POCT: 148 mg/dL — ABNORMAL HIGH (ref 70–100)
Whole Blood Glucose POCT: 165 mg/dL — ABNORMAL HIGH (ref 70–100)
Whole Blood Glucose POCT: 188 mg/dL — ABNORMAL HIGH (ref 70–100)

## 2019-07-20 LAB — HEMOGLOBIN A1C
Average Estimated Glucose: 165.7 mg/dL
Hemoglobin A1C: 7.4 % — ABNORMAL HIGH (ref 4.6–5.9)

## 2019-07-20 LAB — LACTIC ACID, PLASMA: Lactic Acid: 1.2 mmol/L (ref 0.2–2.0)

## 2019-07-20 LAB — GFR: EGFR: >60

## 2019-07-20 MED ORDER — PHENOL 1.4 % MT LIQD
1.00 | OROMUCOSAL | Status: DC | PRN
Start: 2019-07-20 — End: 2019-07-25
  Administered 2019-07-20 – 2019-07-22 (×5): 1 via ORAL
  Filled 2019-07-20: qty 177

## 2019-07-20 NOTE — Progress Notes (Signed)
POST OPERATIVE PROGRESS NOTE  [facility] [phone]    Date Time: 07/20/19 10:36 AM  Patient Name: [name],[name]      ASSESSMENT:   1 Day Post-Op S/P Procedure(s):  EXPLORATORY LAPAROTOMY, SMALL BOWEL RESECTION X 1    PLAN:   NGT until flatus  Abxs x4 days  Appreciate medical consult service input on management of comorbidities        SUBJECTIVE:   The patient is doing well.  Post operative pain is well controlled with medications.  Current dietary status:  Diet NPO effective now  Supervise For Meals Frequency: All meals and tolerating well.  Flatus: no. BM:  no.  Additional complaints: none       OBJECTIVE:   Current Vitals:   Vitals:    07/20/19 0913   BP: 117/62   Pulse: (!) 102   Resp:    Temp:    SpO2:        Intake and Output Summary (Last 24 hours):  I/O last 3 completed shifts:  In: 3487.5 [I.V.:587.5; IV Piggyback:2900]  Out: 1800 [Urine:1800]    Labs:     Results     Procedure Component Value Units Date/Time    Hemoglobin A1C [[id number]]  (Abnormal) Collected: 07/20/19 0427    Specimen: Blood Updated: 07/20/19 1001     Hemoglobin A1C 7.4 %      Average Estimated Glucose 165.7 mg/dL     Glucose Whole Blood - POCT [[id number]]  (Abnormal) Collected: 07/20/19 0847     Updated: 07/20/19 0852     Whole Blood Glucose POCT 148 mg/dL     CBC and differential [[id number]]  (Abnormal) Collected: 07/20/19 0427    Specimen: Blood Updated: 07/20/19 0504     WBC 9.90 x10 3/uL      Hgb 11.8 g/dL      Hematocrit 08.6 %      Platelets 195 x10 3/uL      RBC 4.57 x10 6/uL      MCV 81.2 fL      MCH 25.8 pg      MCHC 31.8 g/dL      RDW 14 %      MPV 9.5 fL      Nucleated RBC 0.0 /100 WBC      Absolute NRBC 0.00 x10 3/uL     Manual Differential [[id number]]  (Abnormal) Collected: 07/20/19 0427     Updated: 07/20/19 0504     Segmented Neutrophils 56 %      Band Neutrophils 37 %      Lymphocytes Manual 3 %      Monocytes Manual 4 %      Eosinophils Manual 0 %      Basophils Manual 0 %      Neutrophils Absolute Manual 5.54 x10 3/uL       Band Neutrophils Absolute 3.66 x10 3/uL      Lymphocytes Absolute Manual 0.30 x10 3/uL      Monocytes Absolute 0.40 x10 3/uL      Eosinophils Absolute Manual 0.00 x10 3/uL      Basophils Absolute Manual 0.00 x10 3/uL     Cell MorpHology [[id number]] Collected: 07/20/19 0427     Updated: 07/20/19 0504     Cell Morphology Normal     Platelet Estimate Normal    Basic Metabolic Panel [[id number]]  (Abnormal) Collected: 07/20/19 0427    Specimen: Blood Updated: 07/20/19 0503     Glucose 171 mg/dL      BUN 16 mg/dL  Creatinine 0.9 mg/dL      Calcium 8.4 mg/dL      Sodium 578 mEq/L      Potassium 4.6 mEq/L      Chloride 107 mEq/L      CO2 21 mEq/L      Anion Gap 10.0    GFR [[id number]] Collected: 07/20/19 0427     Updated: 07/20/19 0503     EGFR >60.0    Lactic Acid [[id number]] Collected: 07/20/19 0427    Specimen: Blood Updated: 07/20/19 0443     Lactic Acid 1.2 mmol/L     Narrative:      Lactic #2    Glucose Whole Blood - POCT [[id number]]  (Abnormal) Collected: 07/20/19 0427     Updated: 07/20/19 0433     Whole Blood Glucose POCT 165 mg/dL     Wound culture & gram stain [[id number]] Collected: 07/19/19 1800    Specimen: Wound from Abrasion Updated: 07/20/19 0141    Narrative:      ORDER#: [id number]                                    ORDERED BY: Othell Jaime, MATTHE  SOURCE: Abrasion abdomen                             COLLECTED:  07/19/19 18:00  ANTIBIOTICS AT COLL.:                                RECEIVED :  07/20/19 00:46  Stain, Gram                                FINAL       07/20/19 01:41  07/20/19   No Squamous epithelial cells seen             Many WBCs             No organisms seen  Culture and Gram Stain, Aerobic, Wound     PENDING      Glucose Whole Blood - POCT [[id number]]  (Abnormal) Collected: 07/20/19 0116     Updated: 07/20/19 0118     Whole Blood Glucose POCT 188 mg/dL     Anaerobic culture [[id number]] Collected: 07/19/19 1800    Specimen: Other from Abscess Updated: 07/20/19 0046    Glucose Whole Blood -  POCT [[id number]]  (Abnormal) Collected: 07/19/19 2356     Updated: 07/19/19 2358     Whole Blood Glucose POCT 200 mg/dL     Blood Culture Aerobic/Anaerobic #2 [[id number]] Collected: 07/19/19 1533    Specimen: Arm from Blood, Venipuncture Updated: 07/19/19 2309    Narrative:      1 BLUE+1 PURPLE    Blood Culture Aerobic/Anaerobic #1 [[id number]] Collected: 07/19/19 1529    Specimen: Arm from Blood, Venipuncture Updated: 07/19/19 1852    Narrative:      1 BLUE+1 PURPLE    Glucose Whole Blood - POCT [[id number]]  (Abnormal) Collected: 07/19/19 1840     Updated: 07/19/19 1842     Whole Blood Glucose POCT 107 mg/dL     Glucose Whole Blood - POCT [[id number]] Collected: 07/19/19 1647     Updated: 07/19/19 1649     Whole Blood Glucose POCT 97 mg/dL  Type and Screen [[id number]] Collected: 07/19/19 1529    Specimen: Blood Updated: 07/19/19 1616     ABO Rh A POS     AB Screen Gel NEG    Troponin I [[id number]] Collected: 07/19/19 1448     Updated: 07/19/19 1553     Troponin I <0.01 ng/mL     Narrative:      Lower abdominal pain    Lactic Acid [[id number]]  (Abnormal) Collected: 07/19/19 1529    Specimen: Blood Updated: 07/19/19 1552     Lactic Acid 2.6 mmol/L     Comprehensive metabolic panel [[id number]]  (Abnormal) Collected: 07/19/19 1448    Specimen: Blood Updated: 07/19/19 1514     Glucose 122 mg/dL      BUN 19 mg/dL      Creatinine 1.1 mg/dL      Sodium 629 mEq/L      Potassium 4.0 mEq/L      Chloride 104 mEq/L      CO2 23 mEq/L      Calcium 9.8 mg/dL      Protein, Total 6.8 g/dL      Albumin 3.8 g/dL      AST (SGOT) 15 U/L      ALT 12 U/L      Alkaline Phosphatase 61 U/L      Bilirubin, Total 0.3 mg/dL      Globulin 3.0 g/dL      Albumin/Globulin Ratio 1.3     Anion Gap 12.0    Narrative:      Lower abdominal pain    Lipase [[id number]] Collected: 07/19/19 1448    Specimen: Blood Updated: 07/19/19 1514     Lipase 36 U/L     Narrative:      Lower abdominal pain    GFR [[id number]] Collected: 07/19/19 1448     Updated:  07/19/19 1514     EGFR 49.2    Narrative:      Lower abdominal pain    CBC and differential [[id number]]  (Abnormal) Collected: 07/19/19 1448    Specimen: Blood Updated: 07/19/19 1458     WBC 9.60 x10 3/uL      Hgb 13.1 g/dL      Hematocrit 59.5 %      Platelets 312 x10 3/uL      RBC 5.11 x10 6/uL      MCV 80.8 fL      MCH 25.6 pg      MCHC 31.7 g/dL      RDW 14 %      MPV 9.6 fL      Neutrophils 64.5 %      Lymphocytes Automated 26.7 %      Monocytes 6.6 %      Eosinophils Automated 1.6 %      Basophils Automated 0.2 %      Immature Granulocytes 0.4 %      Nucleated RBC 0.0 /100 WBC      Neutrophils Absolute 6.20 x10 3/uL      Lymphocytes Absolute Automated 2.56 x10 3/uL      Monocytes Absolute Automated 0.63 x10 3/uL      Eosinophils Absolute Automated 0.15 x10 3/uL      Basophils Absolute Automated 0.02 x10 3/uL      Immature Granulocytes Absolute 0.04 x10 3/uL      Absolute NRBC 0.00 x10 3/uL     Narrative:      Lower abdominal pain    Glucose Whole Blood - POCT [[id number]]  (Abnormal) Collected:  07/19/19 1449     Updated: 07/19/19 1451     Whole Blood Glucose POCT 102 mg/dL           Rads:     Radiology Results (24 Hour)     Procedure Component Value Units Date/Time    CT Abd/ Pelvis without Contrast [[id number]] Collected: 07/19/19 1525    Order Status: Completed Updated: 07/19/19 1535    Narrative:      HISTORY: Abdominal pain.    TECHNIQUE: Noncontrast CT abdomen/pelvis. The following dose reduction  techniques were utilized: Automated exposure control and/or adjustment  of the mA and/or kV according to patient size, and the use of iterative  reconstruction technique.    COMPARISON: None.    FINDINGS: There is a swirling pattern to the upper abdominal mesenteric  vessels suggestive of an internal hernia. While the bowel loops are  suboptimally evaluated without oral contrast, the proximal jejunal loops  are thickened with mild mesenteric fluid and edema. Just posterior to  one of the small bowel loops, there  is a 3.7 x 2.4 cm mottled debris  collection suspicious for a contained perforation. This does not appear  to have a significant wall to suggest a giant diverticulum. A mottled  appearance to the ascending colon is likely due to stool rather than  pneumatosis. There is no bowel obstruction, free air, drainable fluid  collection or adenopathy.    The gallbladder has been resected. Noncontrast images of the liver,  spleen, pancreas and adrenal glands and kidneys are normal. The uterus  is not identified. There is no suspicious adnexal abnormality within the  limits of CT. The abdominal aorta is normal caliber.      Impression:       Swirling mesenteric vessels suggestive of an internal  hernia. There is jejunal thickening and adjacent mesenteric edema with  suspected contained perforation or abscess. Critical results discussed  with Dr. Lourdes Sledge at 3:25 PM.    Bosie Helper, MD   07/19/2019 3:32 PM          Physical Exam:     Gen: NAD  HEENT: Anicteric sclera  Neuro: AAOx3  Resp: Chest rise symmetric, no respiratory distress  CV: No JVD  Abd: Soft, appropriately tender. Incisions c/d/i  Skin: No rashes  Ext: warm, well perfused          Signed by: Talbert Nan

## 2019-07-20 NOTE — Progress Notes (Signed)
[facility]   HOSPITALIST  PROGRESS NOTE      Patient: [name]  Date: 07/20/2019   LOS: 1 Days  Admission Date: 07/19/2019   MRN: [id number]  Attending: Ronney Lion MD     ASSESSMENT/PLAN     [name] is a 69 y.o. female admitted with       1.  Jejunal diverticulitis with perforation   S/p exploratory laparotomy with small bowel resection. POD 1  --Management per surgery   --NGT to remain in place and NPO until patient passes flatus   --pain management  --abx with Rocephin and flagyl   --IVF   --discussed diverticulosis (low fiber) diet with the daughter     2. DM type 2   She takes levemir 32U in AM and 24U in PM, Empagliflozin-Metformin at home   --holding long acting insulin as she is NPO   --continue ISS, AC   --will resume basal insulin when PO initiated and adjust insulin as needed     3. Hypothyroidism   --continue synthroid     4. GERD   --PPI IV daily     5. HTN   --on diltiazem   --holding home losartan. Can restart if BP stable    6. HLP   --continue statin      7. Sinus tachycardia   HR 100-110s. Likely pain related.   --pain management   --chloraseptic spray ordered for irritation of back of throat from NGT       Analgesia: oxycodone prn     Nutrition: NPO       Safety Checklist  DVT prophylaxis: Chemical   Foley: Not present   IVs:  Peripheral IV   PT/OT: Not needed   Daily CBC & or Chem ordered: Yes, due to clinical and lab instability       Patient Lines/Drains/Airways Status    Active PICC Line / CVC Line / PIV Line / Drain / Airway / Intraosseous Line / Epidural Line / ART Line / Line / Wound / Pressure Ulcer / NG/OG Tube     Name:   Placement date:   Placement time:   Site:   Days:    Peripheral IV 07/19/19 20 G Right Antecubital   07/19/19    1440    Antecubital   less than 1    Peripheral IV 07/19/19 20 G Left Antecubital   07/19/19    1452    Antecubital   less than 1    External Urinary Catheter   07/19/19    1850    --   less than 1    Wound 07/19/19 Surgical Incision Abdomen Other  (Comment) gauze, staples, tape   07/19/19    1829    Abdomen   less than 1    NG/OG Tube Nasogastric Left nostril   07/19/19    1907    Left nostril   less than 1                MD/RN rounds: yes        Code Status: FULL CODE     DISPO: continue inpatient care    Family Contact: daughter updated at the bedside     Care Plan discussed with nursing, consultants, case manager.       SUBJECTIVE     [name] nurse reports minimal output from NGT. Patient has complaints of abdominal pain and headache     MEDICATIONS     Current  Facility-Administered Medications   Medication Dose Route Frequency    acetaminophen  650 mg Oral Q6H    cefTRIAXone  2 g Intravenous Q24H    dilTIAZem  30 mg Oral BID    heparin (porcine)  5,000 Units Subcutaneous Q8H [facility]    insulin lispro  1-5 Units Subcutaneous Q4H [facility]    levothyroxine  50 mcg Oral Daily at 0600    metroNIDAZOLE  500 mg Intravenous Q6H    pantoprazole  40 mg Intravenous Daily    simvastatin  20 mg Oral QHS       ROS     Remainder of 10 point ROS as above or otherwise negative    PHYSICAL EXAM     Vitals:    07/20/19 1150   BP: 134/73   Pulse: 91   Resp: 16   Temp: 97.9 F (36.6 C)   SpO2: 94%       Temperature: Temp  Min: 96.6 F (35.9 C)  Max: 98.6 F (37 C)  Pulse: Pulse  Min: 59  Max: 111  Respiratory: Resp  Min: 3  Max: 38  Non-Invasive BP: BP  Min: 62/42  Max: 145/79  Pulse Oximetry SpO2  Min: 89 %  Max: 100 %    Intake and Output Summary (Last 24 hours) at Date Time    Intake/Output Summary (Last 24 hours) at 07/20/2019 1302  Last data filed at 07/20/2019 0805  Gross per 24 hour   Intake 3487.5 ml   Output 2100 ml   Net 1387.5 ml       Constiutional: has rag over eyes due to headache. Patient speaks freely in full sentences. Looks fatigued    HEENT: PERRL, no scleral icterus or conjunctival pallor, no nasal discharge, MMM, oropharynx without erythema or exudate  Neck: trachea midline, supple, no cervical or supraclavicular lymphadenopathy or  masses  Cardiovascular: tachycardic, normal S1 S2, no murmurs  Respiratory: CTAB, no crackles or wheezing   Gastrointestinal: soft, appropriate tenderness, midline incision looks c/d/i with overlying dressing   Musculoskeletal: ROM and motor strength grossly normal. No edema   Skin: no rashes, jaundice or other lesions  Neurologic: EOMI, CN 2-12 grossly intact. no gross motor or sensory deficits  Psychiatric: affect and mood appropriate. The patient is alert, interactive, appropriate.    LABS     Recent Labs   Lab 07/20/19  0427 07/19/19  1448   WBC 9.90* 9.60*   RBC 4.57 5.11*   Hgb 11.8 13.1   Hematocrit 37.1 41.3   MCV 81.2 80.8   Platelets 195 312       Recent Labs   Lab 07/20/19  0427 07/19/19  1448   Sodium 138 139   Potassium 4.6 4.0   Chloride 107 104   CO2 21* 23   BUN 16 19   Creatinine 0.9 1.1*   Glucose 171* 122*   Calcium 8.4* 9.8       Recent Labs   Lab 07/19/19  1448   ALT 12   AST (SGOT) 15   Bilirubin, Total 0.3   Albumin 3.8   Alkaline Phosphatase 61       Recent Labs   Lab 07/19/19  1448   Troponin I <0.01             Microbiology Results     Procedure Component Value Units Date/Time    Anaerobic culture [[id number]] Collected: 07/19/19 1800    Specimen: Other from Abscess Updated: 07/20/19 0046  Blood Culture Aerobic/Anaerobic #1 [[id number]] Collected: 07/19/19 1529    Specimen: Arm from Blood, Venipuncture Updated: 07/19/19 1852    Narrative:      1 BLUE+1 PURPLE    Blood Culture Aerobic/Anaerobic #2 [[id number]] Collected: 07/19/19 1533    Specimen: Arm from Blood, Venipuncture Updated: 07/19/19 2309    Narrative:      1 BLUE+1 PURPLE    Wound culture & gram stain [[id number]] Collected: 07/19/19 1800    Specimen: Wound from Abrasion Updated: 07/20/19 0141    Narrative:      ORDER#: [id number]                                    ORDERED BY: SCRIVEN, MATTHE  SOURCE: Abrasion abdomen                             COLLECTED:  07/19/19 18:00  ANTIBIOTICS AT COLL.:                                RECEIVED  :  07/20/19 00:46  Stain, Gram                                FINAL       07/20/19 01:41  07/20/19   No Squamous epithelial cells seen             Many WBCs             No organisms seen  Culture and Gram Stain, Aerobic, Wound     PENDING             RADIOLOGY     Radiological Procedure xray personally reviewed and concur with radiologist reports unless stated otherwise.    CT Abd/ Pelvis without Contrast   Final Result    Swirling mesenteric vessels suggestive of an internal   hernia. There is jejunal thickening and adjacent mesenteric edema with   suspected contained perforation or abscess. Critical results discussed   with Dr. Lourdes Sledge at 3:25 PM.      Bosie Helper, MD    07/19/2019 3:32 PM          Signed,  Ronney Lion, MD   1:02 PM 07/20/2019

## 2019-07-20 NOTE — Progress Notes (Signed)
Report received from [place], [place]. Assumed care of the patient.

## 2019-07-20 NOTE — Plan of Care (Addendum)
Alert and oriented x4 family at bed side to translate. occasional nausea with NG tube in place, small amount of dark green drainage from NG tube abdomen soft, patient ambulates to bathroom with one person assist.  Continue to monitor patient or improved bowel function.  Problem: Altered GI Function  Goal: Nutritional intake is adequate  Outcome: Not Progressing     Problem: Safety  Goal: Patient will be free from injury during hospitalization  Outcome: Progressing  Goal: Patient will be free from infection during hospitalization  Outcome: Progressing     Problem: Pain  Goal: Pain at adequate level as identified by patient  Outcome: Progressing     Problem: Side Effects from Pain Analgesia  Goal: Patient will experience minimal side effects of analgesic therapy  Outcome: Progressing     Problem: Altered GI Function  Goal: Elimination patterns are normal or improving  Outcome: Progressing  Goal: Mobility/Activity is maintained at optimal level for patient  Outcome: Progressing  Goal: No bleeding  Outcome: Progressing

## 2019-07-20 NOTE — UM Notes (Signed)
This clinical review is based on/compiled from documentation provided by the treatment team within the patients medical record.    Evelyn Gurney, RN, BSN  Clinical Case Manager  [facility]    [address]    [place], [place] [address]  NPI: [id number]  Tax ID: [id number]  Phone: [phone]  Fax: [phone]    Please use fax number [phone] to provide authorization for hospital services or to request additional information.           PATIENT NAME: [name],[name]  DOB: 04-Aug-1950  PMH:   Diagnosis    Atrial fibrillation    Diabetes mellitus    Disorder of thyroid    Hypertension    Hypothyroid     ADMITTED ON: 07/19/2019 @[name] is a 69 y.o. female otherwise healthy here for evaluation of intermittent lower abdominal pain that started last night.  Patient has had this before over the past couple months but this dissipated.  Has had nausea and vomiting once.  Status post cholecystectomy hysterectomy and appendectomy  Patient has been tired more lately and has had some intermittent dizziness with walking.   Patient has past history of diverticulitis.  Patient has a GI doctor and family doctor [place]  Number also states that she has had 2 family members passed away due to Covid  Allergies to penicillin  Family states that she has had no other complaints of any shortness of breath chest pain vomiting urinary symptoms or other concerns        ED Triage Vitals [07/19/19 1418]   Enc Vitals Group      BP 102/57      Heart Rate 74      Resp Rate 22      Temp 97.9 F (36.6 C)      Temp Source Oral      SpO2 93 %      Weight 65.4 kg (144 lb 2.9 oz)      Height 1.575 m (5\' 2" )      Pain Score 9       ADMISSION DIAGNOSIS:   07/19/19 2151  ADMIT TO INPATIENT Once    Diagnosis: Internal Hernia    Level of Care: Acute    Patient Class: Inpatient       References: I[facility] Bed Placement CriteriaIFMC Bed Placement CriteriaIFOH Bed Placement CriteriaILH Bed Placement  CriteriaIMVH Bed Placement Criteria   Question Answer Comment   Admitting Physician SCRIVEN, MATTHEW H    Service: Surgery    Estimated Length of Stay 3 - 5 Days    Tentative Discharge Plan? Home or Self Care    Does patient need telemetry? No                07/19/2019  NOTES:   History of Present Illness:   [name] is a 69 y.o. female who  has a past medical history of Atrial fibrillation, Diabetes mellitus, Disorder of thyroid, Hypertension, and Hypothyroid..  She presented to the hospital with complaints of abdominal pain.  The pain is described as cramping, and is located in the epigastric without radiation. Onset was 1 month ago. Symptoms have been rapidly worsening since. Aggravating factors: none.  Alleviating factors: none. Associated symptoms: nausea. The patient denies diarrhea.    Plan:     [phone] with findings concerning for internal hernia w perf, will take to [facility] for exploration    The patient's relevant radiologic and laboratory studies were  carefully reviewed    This plan was discussed with the patient as well as with the referring physician.       Operative Procedure:   Procedure(s):  EXPLORATORY LAPAROTOMY, SMALL BOWEL RESECTION X 1    Preoperative Diagnosis:   Pre-Op Diagnosis Codes:     * Internal hernia [K45.8]    Postoperative Diagnosis:   Post-Op Diagnosis Codes:     * Internal hernia [K45.8]        Findings:   Jejunal diveritculitis w perf    LABS:      07/19/2019 14:48   WBC 9.60 (H)      07/19/2019 15:29   Lactic Acid 2.6 (H)       MEDS:   Maxipime 2g IV  Rocephin 2g IV  Flagyl 500mg  IV  Morphine 4mg  IV  Protonix 40mg  IV  NaCl 1,026ml IV bolus x3      NaCl 62ml/hr IV continuous    IMAGING:    CT abdomen/pelvis  IMPRESSION:    Swirling mesenteric vessels suggestive of an internal  hernia. There is jejunal thickening and adjacent mesenteric edema with  suspected contained perforation or abscess        07/20/19- Patient remains on surgical unit, POD 1, NPO, NGT     Vitals:    07/20/19  0430 07/20/19 0520 07/20/19 0748 07/20/19 0913   BP: 145/79  120/67 117/62   Pulse: (!) 111  (!) 109 (!) 102   Resp: 18  16    Temp: 98.4 F (36.9 C)  98.6 F (37 C)    TempSrc: Oral  Oral    SpO2: 96%  92%    Weight:  63.5 kg (140 lb)     Height:  1.575 m (5\' 2" )       ASSESSMENT:   1 Day Post-Op S/P Procedure(s):  EXPLORATORY LAPAROTOMY, SMALL BOWEL RESECTION X 1    PLAN:   NGT until flatus  Abxs x4 days  Appreciate medical consult service input on management of comorbidities      Meds:  Current Facility-Administered Medications   Medication Dose Route Frequency    acetaminophen  650 mg Oral Q6H    cefTRIAXone  2 g Intravenous Q24H    dilTIAZem  30 mg Oral BID    heparin (porcine)  5,000 Units Subcutaneous Q8H [facility]    insulin lispro  1-5 Units Subcutaneous Q4H [facility]    levothyroxine  50 mcg Oral Daily at 0600    metroNIDAZOLE  500 mg Intravenous Q6H    pantoprazole  40 mg Intravenous Daily    simvastatin  20 mg Oral QHS      sodium chloride 75 mL/hr at 07/19/19 2130    lactated ringers 1,000 mL/hr at 07/19/19 1819    lactated ringers         PRN:  Dilaudid 0.4mg  IV  Zofran 4mg  IV

## 2019-07-20 NOTE — Plan of Care (Signed)
A&O x4. Pt is on room air. NG tube is in place and connected to wall suction. Pt complains of discomfort around her throat when swallowing med. O.K to administer meds through NG tube per MD. NG tube placement checked. Abdominal incision site is dry and intact without any drainage. IV dilaudid was given this morning for pain. Pt verbalized relief after taking the med. SBA for ambulation. Pt's daughter at bedside for translation. Bed is in low position with call light within reach. Will continue to monitor.     Problem: Safety  Goal: Patient will be free from injury during hospitalization  Outcome: Progressing  Flowsheets (Taken 07/20/2019 1053)  Patient will be free from injury during hospitalization:   Assess patient's risk for falls and implement fall prevention plan of care per policy   Provide and maintain safe environment   Use appropriate transfer methods   Ensure appropriate safety devices are available at the bedside   Hourly rounding   Include patient/ family/ care giver in decisions related to safety   Assess for patients risk for elopement and implement Elopement Risk Plan per policy   Provide alternative method of communication if needed (communication boards, writing)  Goal: Patient will be free from infection during hospitalization  Outcome: Progressing  Flowsheets (Taken 07/20/2019 1053)  Free from Infection during hospitalization:   Assess and monitor for signs and symptoms of infection   Monitor all insertion sites (i.e. indwelling lines, tubes, urinary catheters, and drains)   Encourage patient and family to use good hand hygiene technique   Monitor lab/diagnostic results     Problem: Pain  Goal: Pain at adequate level as identified by patient  Outcome: Progressing  Flowsheets (Taken 07/20/2019 1053)  Pain at adequate level as identified by patient:   Identify patient comfort function goal   Assess for risk of opioid induced respiratory depression, including snoring/sleep apnea. Alert healthcare team  of risk factors identified.   Assess pain on admission, during daily assessment and/or before any "as needed" intervention(s)   Evaluate if patient comfort function goal is met   Reassess pain within 30-60 minutes of any procedure/intervention, per Pain Assessment, Intervention, Reassessment (AIR) Cycle   Evaluate patient's satisfaction with pain management progress   Offer non-pharmacological pain management interventions   Include patient/patient care companion in decisions related to pain management as needed     Problem: Side Effects from Pain Analgesia  Goal: Patient will experience minimal side effects of analgesic therapy  Outcome: Progressing  Flowsheets (Taken 07/20/2019 1053)  Patient will experience minimal side effects of analgesic therapy:   Monitor/assess patient's respiratory status (RR depth, effort, breath sounds)   Prevent/manage side effects per LIP orders (i.e. nausea, vomiting, pruritus, constipation, urinary retention, etc.)   Evaluate for opioid-induced sedation with appropriate assessment tool (i.e. POSS)   Assess for changes in cognitive function     Problem: Discharge Barriers  Goal: Patient will be discharged home or other facility with appropriate resources  Outcome: Progressing  Flowsheets (Taken 07/20/2019 1053)  Discharge to home or other facility with appropriate resources: Provide appropriate patient education     Problem: Psychosocial and Spiritual Needs  Goal: Demonstrates ability to cope with hospitalization/illness  Outcome: Progressing  Flowsheets (Taken 07/20/2019 1053)  Demonstrates ability to cope with hospitalizations/illness:   Encourage verbalization of feelings/concerns/expectations   Assist patient to identify own strengths and abilities   Encourage participation in diversional activity   Include patient/ patient care companion in decisions   Provide quiet environment  Encourage patient to set small goals for self   Reinforce positive adaptation of new coping behaviors      Problem: Moderate/High Fall Risk Score >5  Goal: Patient will remain free of falls  Outcome: Progressing  Flowsheets (Taken 07/20/2019 1000)  High (Greater than 13): MOD-Remain with patient during toileting     Problem: Altered GI Function  Goal: Fluid and electrolyte balance are achieved/maintained  Outcome: Progressing  Flowsheets (Taken 07/20/2019 1053)  Fluid and electrolyte balance are achieved/maintained:   Monitor intake and output every shift   Monitor/assess lab values and report abnormal values   Provide adequate hydration   Assess and reassess fluid and electrolyte status   Monitor for muscle weakness  Goal: Elimination patterns are normal or improving  Outcome: Progressing  Flowsheets (Taken 07/20/2019 1053)  Elimination patterns are normal or improving:   Report abnormal assessment to physician   Assess for normal bowel sounds   Monitor for abdominal distension   Monitor for abdominal discomfort   Administer treatments as ordered   Assess for flatus   Encourage /perform oral hygiene as appropriate  Goal: Nutritional intake is adequate  Outcome: Not Progressing  Flowsheets (Taken 07/20/2019 1053)  Nutritional intake is adequate: Encourage/administer dietary supplements as ordered (i.e. tube feed, TPN, oral, OGT/NGT, supplements)  Goal: Mobility/Activity is maintained at optimal level for patient  Outcome: Progressing  Flowsheets (Taken 07/20/2019 1053)  Mobility/activity is maintained at optimal level for patient:   Encourage independent activity per ability   Increase mobility as tolerated/progressive mobility

## 2019-07-21 ENCOUNTER — Inpatient Hospital Stay: Payer: Medicare Other

## 2019-07-21 ENCOUNTER — Encounter: Payer: Self-pay | Admitting: Surgery

## 2019-07-21 DIAGNOSIS — K458 Other specified abdominal hernia without obstruction or gangrene: Secondary | ICD-10-CM

## 2019-07-21 DIAGNOSIS — Z4659 Encounter for fitting and adjustment of other gastrointestinal appliance and device: Secondary | ICD-10-CM | POA: Diagnosis not present

## 2019-07-21 DIAGNOSIS — I10 Essential (primary) hypertension: Secondary | ICD-10-CM | POA: Diagnosis not present

## 2019-07-21 DIAGNOSIS — Z794 Long term (current) use of insulin: Secondary | ICD-10-CM | POA: Diagnosis not present

## 2019-07-21 DIAGNOSIS — K5700 Diverticulitis of small intestine with perforation and abscess without bleeding: Secondary | ICD-10-CM | POA: Diagnosis not present

## 2019-07-21 DIAGNOSIS — E119 Type 2 diabetes mellitus without complications: Secondary | ICD-10-CM | POA: Diagnosis not present

## 2019-07-21 LAB — BASIC METABOLIC PANEL
Anion Gap: 14 (ref 5.0–15.0)
BUN: 21 mg/dL — ABNORMAL HIGH (ref 7–19)
CO2: 17 mEq/L — ABNORMAL LOW (ref 22–29)
Calcium: 8.3 mg/dL — ABNORMAL LOW (ref 8.5–10.5)
Chloride: 108 mEq/L (ref 100–111)
Creatinine: 0.9 mg/dL (ref 0.6–1.0)
Glucose: 114 mg/dL — ABNORMAL HIGH (ref 70–100)
Potassium: 4 mEq/L (ref 3.5–5.1)
Sodium: 139 mEq/L (ref 136–145)

## 2019-07-21 LAB — GFR: EGFR: >60

## 2019-07-21 LAB — CBC AND DIFFERENTIAL
Absolute NRBC: 0 10*3/uL (ref 0.00–0.00)
Basophils Absolute Automated: 0.02 10*3/uL (ref 0.00–0.08)
Basophils Automated: 0.2 %
Eosinophils Absolute Automated: 0.04 10*3/uL (ref 0.00–0.44)
Eosinophils Automated: 0.4 %
Hematocrit: 31.4 % — ABNORMAL LOW (ref 34.7–43.7)
Hgb: 10.1 g/dL — ABNORMAL LOW (ref 11.4–14.8)
Immature Granulocytes Absolute: 0.05 10*3/uL (ref 0.00–0.07)
Immature Granulocytes: 0.6 %
Lymphocytes Absolute Automated: 0.93 10*3/uL (ref 0.42–3.22)
Lymphocytes Automated: 10.3 %
MCH: 26.4 pg (ref 25.1–33.5)
MCHC: 32.2 g/dL (ref 31.5–35.8)
MCV: 82 fL (ref 78.0–96.0)
MPV: 9.5 fL (ref 8.9–12.5)
Monocytes Absolute Automated: 0.51 10*3/uL (ref 0.21–0.85)
Monocytes: 5.6 %
Neutrophils Absolute: 7.52 10*3/uL — ABNORMAL HIGH (ref 1.10–6.33)
Neutrophils: 82.9 %
Nucleated RBC: 0 /100 WBC (ref 0.0–0.0)
Platelets: 182 10*3/uL (ref 142–346)
RBC: 3.83 10*6/uL — ABNORMAL LOW (ref 3.90–5.10)
RDW: 15 % (ref 11–15)
WBC: 9.07 10*3/uL (ref 3.10–9.50)

## 2019-07-21 LAB — GLUCOSE WHOLE BLOOD - POCT
Whole Blood Glucose POCT: 113 mg/dL — ABNORMAL HIGH (ref 70–100)
Whole Blood Glucose POCT: 114 mg/dL — ABNORMAL HIGH (ref 70–100)
Whole Blood Glucose POCT: 106 mg/dL — ABNORMAL HIGH (ref 70–100)
Whole Blood Glucose POCT: 106 mg/dL — ABNORMAL HIGH (ref 70–100)
Whole Blood Glucose POCT: 96 mg/dL (ref 70–100)

## 2019-07-21 NOTE — Progress Notes (Signed)
[facility]   HOSPITALIST  PROGRESS NOTE      [name]  Date: 07/21/2019   LOS: 2 Days  Admission Date: 07/19/2019   MRN: [id number]  Attending: Ronney Lion MD     ASSESSMENT/PLAN     [name] is a 69 y.o. female admitted with       1.  Jejunal diverticulitis with perforation   S/p exploratory laparotomy with small bowel resection. POD 2  --Management per surgery   --NGT to low wall suction   --NPO until [name] passes flatus   --pain management  --abx with Rocephin and flagyl   --IVF   --discussed diverticulosis (low fiber) diet with Evelyn daughter     2. DM type 2   She takes levemir 32U in AM and 24U in PM, Empagliflozin-Metformin at home   --holding long acting insulin as she is NPO   --continue ISS, AC   --will resume basal insulin when PO initiated and adjust insulin as needed     3. Hypothyroidism   --continue synthroid     4. GERD   --PPI IV daily     5. HTN   --on diltiazem   --holding home losartan. Can restart if BP stable    6. HLP   --continue statin      7. Sinus tachycardia   HR 100-110s. Likely pain related.   --pain management   --chloraseptic spray ordered for irritation of back of throat from NGT       Analgesia: oxycodone prn     Nutrition: NPO       Safety Checklist  DVT prophylaxis: Chemical   Foley: Not present   IVs:  Peripheral IV   PT/OT: Not needed   Daily CBC & or Chem ordered: Yes, due to clinical and lab instability       [name] Lines/Drains/Airways Status    Active PICC Line / CVC Line / PIV Line / Drain / Airway / Intraosseous Line / Epidural Line / ART Line / Line / Wound / Pressure Ulcer / NG/OG Tube     Name:   Placement date:   Placement time:   Site:   Days:    Peripheral IV 07/19/19 20 G Right Antecubital   07/19/19    1440    Antecubital   less than 1    Peripheral IV 07/19/19 20 G Left Antecubital   07/19/19    1452    Antecubital   less than 1    External Urinary Catheter   07/19/19    1850    --   less than 1    Wound 07/19/19 Surgical Incision Abdomen  Other (Comment) gauze, staples, tape   07/19/19    1829    Abdomen   less than 1    NG/OG Tube Nasogastric Left nostril   07/19/19    1907    Left nostril   less than 1                MD/RN rounds: yes        Code Status: FULL CODE     DISPO: continue inpatient care    Family Contact: daughter updated at Evelyn bedside     Care Plan discussed with nursing, consultants, case manager.       SUBJECTIVE     [name] 50 cc output from NGT in Evelyn morning from overnight. Plan for NGT advancement today     MEDICATIONS  Current Facility-Administered Medications   Medication Dose Route Frequency    acetaminophen  650 mg Oral Q6H    cefTRIAXone  2 g Intravenous Q24H    dilTIAZem  30 mg Oral BID    heparin (porcine)  5,000 Units Subcutaneous Q8H [facility]    insulin lispro  1-5 Units Subcutaneous Q4H [facility]    levothyroxine  50 mcg Oral Daily at 0600    metroNIDAZOLE  500 mg Intravenous Q6H    pantoprazole  40 mg Intravenous Daily    simvastatin  20 mg Oral QHS       ROS     Remainder of 10 point ROS as above or otherwise negative    PHYSICAL EXAM     Vitals:    07/21/19 1209   BP: 138/78   Pulse: (!) 101   Resp: 16   Temp: 98.4 F (36.9 C)   SpO2: 90%       Temperature: Temp  Min: 98.1 F (36.7 C)  Max: 98.4 F (36.9 C)  Pulse: Pulse  Min: 76  Max: 104  Respiratory: Resp  Min: 1  Max: 20  Non-Invasive BP: BP  Min: 122/66  Max: 150/77  Pulse Oximetry SpO2  Min: 90 %  Max: 95 %    Intake and Output Summary (Last 24 hours) at Date Time    Intake/Output Summary (Last 24 hours) at 07/21/2019 1519  Last data filed at 07/21/2019 1111  Gross per 24 hour   Intake 2350 ml   Output 1850 ml   Net 500 ml       Constiutional: [name] speaks freely in full sentences. Looks fatigued    HEENT: PERRL, no scleral icterus or conjunctival pallor, no nasal discharge, MMM, oropharynx without erythema or exudate  Neck: trachea midline, supple, no cervical or supraclavicular lymphadenopathy or masses  Cardiovascular: tachycardic, normal S1 S2,  no murmurs  Respiratory: CTAB, no crackles or wheezing   Gastrointestinal: soft, appropriate tenderness, midline incision looks c/d/i   Musculoskeletal: ROM and motor strength grossly normal. No edema   Skin: no rashes, jaundice or other lesions  Neurologic: EOMI, CN 2-12 grossly intact. no gross motor or sensory deficits  Psychiatric: affect and mood appropriate. [name] is alert, interactive, appropriate.    LABS     Recent Labs   Lab 07/21/19  0938 07/20/19  0427 07/19/19  1448   WBC 9.07 9.90* 9.60*   RBC 3.83* 4.57 5.11*   Hgb 10.1* 11.8 13.1   Hematocrit 31.4* 37.1 41.3   MCV 82.0 81.2 80.8   Platelets 182 195 312       Recent Labs   Lab 07/21/19  0938 07/20/19  0427 07/19/19  1448   Sodium 139 138 139   Potassium 4.0 4.6 4.0   Chloride 108 107 104   CO2 17* 21* 23   BUN 21* 16 19   Creatinine 0.9 0.9 1.1*   Glucose 114* 171* 122*   Calcium 8.3* 8.4* 9.8       Recent Labs   Lab 07/19/19  1448   ALT 12   AST (SGOT) 15   Bilirubin, Total 0.3   Albumin 3.8   Alkaline Phosphatase 61       Recent Labs   Lab 07/19/19  1448   Troponin I <0.01             Microbiology Results     Procedure Component Value Units Date/Time    Anaerobic culture [[id number]] Collected: 07/19/19 1800  Specimen: Other from Abscess Updated: 07/20/19 0046    Blood Culture Aerobic/Anaerobic #1 [[id number]] Collected: 07/19/19 1529    Specimen: Arm from Blood, Venipuncture Updated: 07/19/19 1852    Narrative:      1 BLUE+1 PURPLE    Blood Culture Aerobic/Anaerobic #2 [[id number]] Collected: 07/19/19 1533    Specimen: Arm from Blood, Venipuncture Updated: 07/19/19 2309    Narrative:      1 BLUE+1 PURPLE    Wound culture & gram stain [[id number]] Collected: 07/19/19 1800    Specimen: Wound from Abrasion Updated: 07/20/19 0141    Narrative:      ORDER#: [id number]                                    ORDERED BY: SCRIVEN, MATTHE  SOURCE: Abrasion abdomen                             COLLECTED:  07/19/19 18:00  ANTIBIOTICS AT COLL.:                                 RECEIVED :  07/20/19 00:46  Stain, Gram                                FINAL       07/20/19 01:41  07/20/19   No Squamous epithelial cells seen             Many WBCs             No organisms seen  Culture and Gram Stain, Aerobic, Wound     PENDING             RADIOLOGY     Radiological Procedure xray personally reviewed and concur with radiologist reports unless stated otherwise.    XR Abdomen Portable   Final Result      NG tube tip in stomach.                  Wilmon Pali, MD    07/21/2019 10:43 AM      CT Abd/ Pelvis without Contrast   Final Result    Swirling mesenteric vessels suggestive of an internal   hernia. There is jejunal thickening and adjacent mesenteric edema with   suspected contained perforation or abscess. Critical results discussed   with Dr. Lourdes Sledge at 3:25 PM.      Bosie Helper, MD    07/19/2019 3:32 PM          Signed,  Ronney Lion, MD   3:19 PM 07/21/2019

## 2019-07-21 NOTE — Progress Notes (Signed)
Case Management Initial Assessment and Discharge Planning:      Situation Grossi,Bristyl R,69 y.o.,female  HospitalDay:2  Admitting DX: Internal hernia [K45.8]    Lace Score:   Background   DME: cane but denies use  Home Health: na  Transportation: car  Out Patient: na     Assessment Spoke with patient who deferred to her son-in-law, Cherylann Parr, [phone] Juline Sanderford, daughter, [phone] also).  Mr. Haywood Pao states pt is here visiting from [place] where she lives with her sister.  She does not use any DME to ambulate and is independent in ADL's but does not drive.  Sister and family transport her wherever she needs to go. The current plan is for pt to stay here for a while till she completely recovers. Then she will go back to [facility]. to sister's. PT/OT evals, pending.    Discharge Barriers identified upon initial assessment: none   Recommendation   Possible post-hospitalization need based on patient's GOC and treatment plan:       [place] home with family. PM&R evals pending.  May need DME and PT/OT at [place].             CM will continue to monitor patient's progression of care, d/c needs and possible barriers to discharge.          07/21/19 1141   Patient Type   Within 30 Days of Previous Admission? No   Healthcare Decisions   Interviewed: Family;Patient   Name of interviewee if other than the pt: Cherylann Parr, [phone], son-in-law.   Orientation/Decision Making Abilities of Patient Alert and Oriented x3, able to make decisions   (RETIRED) Healthcare Agent's Name Rogan Ecklund, daughter, [phone]   Prior to admission   Prior level of function Independent with ADLs;Ambulates independently   Type of Residence Private residence   Have running water, electricity, heat, etc? Yes   Living Arrangements Family members   How do you get to your MD appointments? family   How do you get your groceries? family   Who fixes your meals? self/family   Who does your laundry? self   Who picks up your prescriptions? family   Dressing  Independent   Grooming Independent   Feeding Independent   Bathing Independent   Toileting Independent   DME Currently at [facility]   Discharge Planning   Support Systems Family members   Patient expects to be discharged to: home with family   Anticipated [place] plan discussed with: Same as interviewed   [place] discussion contact information: Kailin Principato, daughter, [phone]   Mode of transportation: Private car (family member)   Does the patient have perscription coverage? Yes   Consults/Providers   PT Evaluation Needed 1   OT Evalulation Needed 1   SLP Evaluation Needed 2   Outcome Palliative Care Screen Screened but did not meet criteria for intervention   Correct PCP listed in Epic? No (comment)  (Pt is from [facility]. and her pcp is there.)   Family and PCP   PCP on file was verified as the current PCP? Provider not on file   In case you are admitted, would like family notified? Yes   Name of family member to be notified Mykalah Saari, daughter, [phone]   In case you are admitted, would like your PCP notified? Unknown   Important Message from Medicare Notice   Patient received 1st IMM Letter? Yes   Date of most recent IMM given: 07/21/19

## 2019-07-21 NOTE — Op Note (Signed)
FULL OPERATIVE NOTE    Date Time: 07/21/19 2:07 PM  Patient Name: [name]  Attending Physician: Talbert Nan, MD      Date of Operation:   07/19/2019    Providers Performing:   Surgeon(s):  Dylanie Quesenberry, Lupita Raider, MD    Circulator: Shirlyn Goltz, RN  Scrub Person: Ulysees Barns D  First Assistant: Tomma Rakers    Operative Procedure:   Procedure(s):  EXPLORATORY LAPAROTOMY, SMALL BOWEL RESECTION X 1    Preoperative Diagnosis:   Pre-Op Diagnosis Codes:     * Internal hernia [K45.8]    Postoperative Diagnosis:   Post-Op Diagnosis Codes:     * Internal hernia [K45.8]    Indications:   22F presenting to ER w peritonitis and evidence of small bowel perforation v internal hernia on CT scan    Operative Notes:   After consent obtained, the patient was brought to the OR.  SCDs were placed and the patient was anesthetized.  Surgical pause was performed. The patient was prepped and draped in sterile fashion. A midline laparotomy was performed and the abdomen examined. There was purulent fluid throughout the abdomen. We ran the small bowel and found jejunal diverticula with one of them being very inflamed with a small perforation. We  transected the bowel just proximal and distal to this area with 60mm endoGIA stapler. The mesentery was then divided with a Ligasure device. A small enterotomy was made in both limbs. We then created a common enterotomy using the stapler again. The enterotomy was then closed using 3-0 vloc. The mesenteric defect was closed w vicryl.  We then irrigated the abdomen with several liters of warm saline solution. At this point we changed gowns and gloves. We then closed the abdominal wall with 2 #1 looped PDS sutures. The subQ was irrigated and the skin closed with a stapler .  The patient was awakened and taken to PACU in stable condition with all counts correct. *      Estimated Blood Loss:   * No values recorded between 07/19/2019  5:15 PM and 07/19/2019  6:31 PM *    Implants:   *  No implants in log *    Drains:   Drains: no    Specimens:     ID Type Source Tests Collected by Time Destination   A :  Not Applicable Small Bowel SURGICAL PATHOLOGY Talbert Nan, MD 07/19/2019 1801          Complications:   None readily apparent    Signed by: Talbert Nan, MD

## 2019-07-21 NOTE — Progress Notes (Signed)
POST OPERATIVE PROGRESS NOTE  [facility] [phone]    Date Time: 07/21/19 9:50 AM  Patient Name: [name],[name]      ASSESSMENT:   2 Days Post-Op S/P Procedure(s):  EXPLORATORY LAPAROTOMY, SMALL BOWEL RESECTION X 1  - jejunal diverticulitis with perforation noted intra-op    Doing well today. Very mild tachycardia today ( ~104bpm), likely reactive. Otherwise AVSS. No leukocytosis. Labs reviewed. NG in place with minimal output. Not passing gas as of yet or BM. Denies any nausea/vomiting. Pain well controlled  PLAN:   - labs ordered  - advance NG  - AXR to evaluate NG location after advancement. Place to LCS once position confirmed  - Continue NPO for now. Await diet advancement until demonstrating return of bowel function per Dr. Eustace Quail  - continue Rocephin & flagyl x 4 days  - Supportive care: IV hydration, anti-emetics, analgesics  - SQ Heparin, bilateral SCDs  - dressing taken down today  - hospitalist assistance appreciated with comorbid management    * Patient seen with Dr. Joanna Puff    SUBJECTIVE:   The patient is doing well.  Post operative pain is well controlled with medications.  Current dietary status:  Diet NPO effective now  Supervise For Meals Frequency: All meals and tolerating well.  Flatus: no. BM:  no.  Additional complaints: none       OBJECTIVE:   Current Vitals:   Vitals:    07/21/19 0755   BP: 127/62   Pulse: (!) 104   Resp: 20   Temp: 98.2 F (36.8 C)   SpO2: 90%       Intake and Output Summary (Last 24 hours):  I/O last 3 completed shifts:  In: 3237.5 [I.V.:2437.5; IV Piggyback:800]  Out: 3350 [Urine:3200; Emesis/NG output:150]    Labs:     Results     Procedure Component Value Units Date/Time    CBC and differential [[id number]] Collected: 07/21/19 0938    Specimen: Blood Updated: 07/21/19 0950    Basic Metabolic Panel [[id number]] Collected: 07/21/19 0938    Specimen: Blood Updated: 07/21/19 0950    Glucose Whole Blood - POCT [[id number]]  (Abnormal) Collected: 07/21/19 0813     Updated:  07/21/19 0820     Whole Blood Glucose POCT 114 mg/dL     Glucose Whole Blood - POCT [[id number]]  (Abnormal) Collected: 07/21/19 0500     Updated: 07/21/19 0503     Whole Blood Glucose POCT 113 mg/dL     Glucose Whole Blood - POCT [[id number]]  (Abnormal) Collected: 07/20/19 2335     Updated: 07/20/19 2338     Whole Blood Glucose POCT 128 mg/dL     Glucose Whole Blood - POCT [[id number]]  (Abnormal) Collected: 07/20/19 1958     Updated: 07/20/19 2001     Whole Blood Glucose POCT 119 mg/dL     Glucose Whole Blood - POCT [[id number]]  (Abnormal) Collected: 07/20/19 1611     Updated: 07/20/19 1616     Whole Blood Glucose POCT 124 mg/dL           Rads:     Radiology Results (24 Hour)     ** No results found for the last 24 hours. **          Physical Exam:     Mental status - alert, oriented to person, place, and time  Chest - no tachypnea, retractions or cyanosis, breathing comfortably on room air, audibly clear respiraitons  Heart - normal rate and regular rhythm  Abdomen -  soft, mildly tender at incision/appropriate, non-distended, no rebound tenderness or guarding  Wound - clean, dry, no drainage, staples intact  Extremities - no pedal edema noted, warm, well perfused      Signed by: Ruel Favors

## 2019-07-21 NOTE — Nursing Progress Note (Addendum)
Received verbal order to advance NG to 60 and get and X ray from Dr.Svestka    1048: NG tube appears to be kinked, alerted Suezanne Jacquet, image has't been read by Radiologist yet.      1100: Per Radiologist impression NG tube is in tip of stomach, received orders to connect patient back to LCS from [employer] PA

## 2019-07-21 NOTE — Plan of Care (Signed)
Alert and oriented x4 family at bed side to translate. No complaints of nausea this shift, small amount of dark green drainage from NG tube abdomen soft, patient ambulates to bathroom with one person assist.- voiding adequately.  No flatus as of yet. Continue to monitor patient for improved bowel function.  Problem: Pain  Goal: Pain at adequate level as identified by patient  Outcome: Progressing     Problem: Altered GI Function  Goal: Fluid and electrolyte balance are achieved/maintained  Outcome: Progressing  Goal: Elimination patterns are normal or improving  Outcome: Progressing  Goal: Nutritional intake is adequate  Outcome: Progressing  Goal: Mobility/Activity is maintained at optimal level for patient  Outcome: Progressing

## 2019-07-21 NOTE — Plan of Care (Signed)
Pt resting in bed, AOX4, family at bedside. Pain is managed with PO oxycodone with good outcome, no C/O nausea since zofran admin this afternoon. NG tube to LCS with 250 mL output this shift. Abd is soft and non-distended, tender with palpation, BS hypoactive x4. Not passing flatus since surgery. Midline abdominal incision well approximated/ gauze is C/D/I. Bed low and locked, call bell in reach, will cont to monitor.

## 2019-07-22 DIAGNOSIS — K5700 Diverticulitis of small intestine with perforation and abscess without bleeding: Secondary | ICD-10-CM | POA: Diagnosis not present

## 2019-07-22 DIAGNOSIS — Z794 Long term (current) use of insulin: Secondary | ICD-10-CM | POA: Diagnosis not present

## 2019-07-22 DIAGNOSIS — I10 Essential (primary) hypertension: Secondary | ICD-10-CM | POA: Diagnosis not present

## 2019-07-22 DIAGNOSIS — E119 Type 2 diabetes mellitus without complications: Secondary | ICD-10-CM | POA: Diagnosis not present

## 2019-07-22 LAB — BASIC METABOLIC PANEL
Anion Gap: 16 — ABNORMAL HIGH (ref 5.0–15.0)
BUN: 18 mg/dL (ref 7–19)
CO2: 15 mEq/L — ABNORMAL LOW (ref 22–29)
Calcium: 8.2 mg/dL — ABNORMAL LOW (ref 8.5–10.5)
Chloride: 107 mEq/L (ref 100–111)
Creatinine: 0.8 mg/dL (ref 0.6–1.0)
Glucose: 94 mg/dL (ref 70–100)
Potassium: 3.6 mEq/L (ref 3.5–5.1)
Sodium: 138 mEq/L (ref 136–145)

## 2019-07-22 LAB — CBC AND DIFFERENTIAL
Absolute NRBC: 0 10*3/uL (ref 0.00–0.00)
Basophils Absolute Automated: 0.03 10*3/uL (ref 0.00–0.08)
Basophils Automated: 0.4 %
Eosinophils Absolute Automated: 0.17 10*3/uL (ref 0.00–0.44)
Eosinophils Automated: 2 %
Hematocrit: 32.8 % — ABNORMAL LOW (ref 34.7–43.7)
Hgb: 10.6 g/dL — ABNORMAL LOW (ref 11.4–14.8)
Immature Granulocytes Absolute: 0.07 10*3/uL (ref 0.00–0.07)
Immature Granulocytes: 0.8 %
Lymphocytes Absolute Automated: 0.8 10*3/uL (ref 0.42–3.22)
Lymphocytes Automated: 9.4 %
MCH: 26.4 pg (ref 25.1–33.5)
MCHC: 32.3 g/dL (ref 31.5–35.8)
MCV: 81.8 fL (ref 78.0–96.0)
MPV: 9.4 fL (ref 8.9–12.5)
Monocytes Absolute Automated: 0.48 10*3/uL (ref 0.21–0.85)
Monocytes: 5.6 %
Neutrophils Absolute: 6.96 10*3/uL — ABNORMAL HIGH (ref 1.10–6.33)
Neutrophils: 81.8 %
Nucleated RBC: 0 /100 WBC (ref 0.0–0.0)
Platelets: 187 10*3/uL (ref 142–346)
RBC: 4.01 10*6/uL (ref 3.90–5.10)
RDW: 14 % (ref 11–15)
WBC: 8.51 10*3/uL (ref 3.10–9.50)

## 2019-07-22 LAB — GFR: EGFR: >60

## 2019-07-22 LAB — GLUCOSE WHOLE BLOOD - POCT
Whole Blood Glucose POCT: 103 mg/dL — ABNORMAL HIGH (ref 70–100)
Whole Blood Glucose POCT: 105 mg/dL — ABNORMAL HIGH (ref 70–100)
Whole Blood Glucose POCT: 126 mg/dL — ABNORMAL HIGH (ref 70–100)
Whole Blood Glucose POCT: 86 mg/dL (ref 70–100)
Whole Blood Glucose POCT: 93 mg/dL (ref 70–100)
Whole Blood Glucose POCT: 97 mg/dL (ref 70–100)

## 2019-07-22 NOTE — PT Eval Note (Signed)
[facility]  Physical Therapy Evaluation    Patient: [name] MRN: [id number]   Unit: 5NEW ORTHOPEDICS    Bed: G295/M841-32      Attention Physicians:  For patients in an observation or outpatient status, Medicare requires the ordering provider to certify that this service is medically necessary.  Please co-sign this evaluation to indicate your agreement.  Thank you.      Discharge Recommendation: Home    DME Recommended for Discharge:none  Recommended  (non-medical) mode of transport: car    Assessment:   Patient is ind gait very min A fro transfers to prevent abd pain. No continued inpatient physical therapy needs.   Discharge therapy.            PMP - Progressive Mobility Protocol   PMP Activity: Step 7 - Walks out of Room (300)  Distance Walked (ft) (Step 6,7): 300 Feet        Interdisciplinary Communication:   Patient is in bed with alarm activated; call bell within reach.  Updated white communication board in room with patient's current mobility status . Spoke with RN regarding results of evaluation.    Plan:   Goals:  All physical therapy goals met on evaluation.  Discontinue PT.         Education:   Educated patient on role of physical therapy and no further needs for inpatient PT.REview  Of home safety, progressiv ambulation.  Patient verbalized understanding and in agreement with discharge.    Evaluation:   Consult received for [name] for PT evaluation and treatment.  Chart reviewed.  Patient's medical condition is appropriate for Physical Therapy intervention at this time.     Medical Diagnosis: Internal hernia [K45.8]    Therapy Diagnosis: difficulty walking,  generalized muscle weakness       PPE worn during patient contact:   face shield  , surgical mask          History of Present Illness: [name] is a 69 y.o. female admitted on 07/19/2019 with  EXPLORATORY LAPAROTOMY, SMALL BOWEL RESECTION X 1  POD 3 today  Patient Active Problem List   Diagnosis    Internal hernia      Past Medical History:   Diagnosis Date    Atrial fibrillation     Diabetes mellitus     Disorder of thyroid     Hypertension     Hypothyroid      Past Surgical History:   Procedure Laterality Date    APPENDECTOMY      BREAST LUMPECTOMY Left 2018    CHOLECYSTECTOMY      EXPLORATORY LAPAROTOMY N/A 07/19/2019    Procedure: EXPLORATORY LAPAROTOMY, SMALL BOWEL RESECTION X 1;  Surgeon: Evelyn Nan, MD;  Location: [place] [facility] OR;  Service: General;  Laterality: N/A;    HYSTERECTOMY              Prior Level of Function   Prior level of function: Ambulates / Performs ADL's independently   Baseline Activity Level: Community ambulation   DME Currently at Home: none    Home Living Arrangements   c family condo, one FOS to enter    Subjective: Patient is agreeable to participation in the therapy session.      Pain Assessment  4/10 abd incision, reviewed splinting      Objective:  Observation of patient/vitals  Cognition:Alert Ox 3   Son and daughter present, language waiver on form  Inspection/Posture:NG to suction,abd incison s erythema  Musculoskeletal Examination  Gross ROM: Within functional limits B LE  Gross Strength: Within functional limits B LE       Functional Mobility  Transfers: independent  Increased effort in/OOB but ind  Ambulation ind s device x 300 feet, decreased cadence  Stairs: ind c rail step to pattern      Balance: within functional limits    Participation and Endurance   Participation Effort: excellent   Endurance:  HR 90 O2 96% no SOB p gait        MD cosign needed      Time Calculation  PT Received On: 07/22/19  Start Time: 1130  Stop Time: 1155  Time Calculation (min): 25 min      [name], PT

## 2019-07-22 NOTE — Plan of Care (Signed)
Shift Note:      Orientation: A/O x 4  Rhythm on tele: NT  Oxygen: RA  Ambulation: Standby assist  Pain: Oxycodone  Lines/Drips: L AC  GI/GU: C x 2  Fall Score: Mod    Critical Labs/Imaging/Procedures:     Comments:   - see doc flow for complete assessment and vitals.     Significant Shift Events and Questions for Attending:    Plan:   - Vitals Q4H   - Pain medication  - Walking  - Voiding  - Clamped NG tube, residuals to be checked      (Braden Score: ?18 include skin assessment template)  Skin Assessment: Cosign Note:    Skin WNL EXCEPT for:     HEAD:   LUE:   RUE:   TORSO: abd incision, staples, CDI  BACK:  SACRUM:  BUTTOCKS/INGUINAL/PERINEAL:  LLE:  RLE:  OTHER:

## 2019-07-22 NOTE — Plan of Care (Signed)
Patient resting in room with bed locked in lowest position and call bell within reach. A & O x4. On RA. Pain well controlled with PRN meds. NGT in place to LCS with brown output. Midline incision OTA. Ambulates stand by assist in hallway. Denies N/V this shift. Voiding adequately. All needs met at this time. Will continue with current plan of care.     Problem: Pain  Goal: Pain at adequate level as identified by patient  Outcome: Progressing  Flowsheets (Taken 07/20/2019 1053 by Eusebio Me, RN)  Pain at adequate level as identified by patient:   Identify patient comfort function goal   Assess for risk of opioid induced respiratory depression, including snoring/sleep apnea. Alert healthcare team of risk factors identified.   Assess pain on admission, during daily assessment and/or before any "as needed" intervention(s)   Evaluate if patient comfort function goal is met   Reassess pain within 30-60 minutes of any procedure/intervention, per Pain Assessment, Intervention, Reassessment (AIR) Cycle   Evaluate patient's satisfaction with pain management progress   Offer non-pharmacological pain management interventions   Include patient/patient care companion in decisions related to pain management as needed     Problem: Moderate/High Fall Risk Score >5  Goal: Patient will remain free of falls  Outcome: Progressing  Flowsheets (Taken 07/21/2019 2000)  High (Greater than 13):   MOD-Place Fall Risk level on whiteboard in room   HIGH-Apply yellow "Fall Risk" arm band   MOD-Request PT/OT consult order for patients with gait/mobility impairment   MOD-Remain with patient during toileting     Problem: Altered GI Function  Goal: Fluid and electrolyte balance are achieved/maintained  Outcome: Progressing  Flowsheets (Taken 07/20/2019 1053 by Eusebio Me, RN)  Fluid and electrolyte balance are achieved/maintained:   Monitor intake and output every shift   Monitor/assess lab values and report abnormal values   Provide adequate  hydration   Assess and reassess fluid and electrolyte status   Monitor for muscle weakness  Goal: Elimination patterns are normal or improving  Outcome: Progressing  Flowsheets (Taken 07/20/2019 1053 by Eusebio Me, RN)  Elimination patterns are normal or improving:   Report abnormal assessment to physician   Assess for normal bowel sounds   Monitor for abdominal distension   Monitor for abdominal discomfort   Administer treatments as ordered   Assess for flatus   Encourage /perform oral hygiene as appropriate  Goal: Nutritional intake is adequate  Outcome: Progressing  Flowsheets (Taken 07/20/2019 1053 by Eusebio Me, RN)  Nutritional intake is adequate: Encourage/administer dietary supplements as ordered (i.e. tube feed, TPN, oral, OGT/NGT, supplements)  Goal: Mobility/Activity is maintained at optimal level for patient  Outcome: Progressing  Flowsheets (Taken 07/20/2019 1053 by Eusebio Me, RN)  Mobility/activity is maintained at optimal level for patient:   Encourage independent activity per ability   Increase mobility as tolerated/progressive mobility  Goal: No bleeding  Outcome: Progressing  Flowsheets (Taken 07/22/2019 0304)  No bleeding: Monitor and assess vitals and hemodynamic parameters

## 2019-07-22 NOTE — Progress Notes (Signed)
POST OPERATIVE PROGRESS NOTE  [facility] [id number]    Date Time: 07/22/19 11:55 AM  Patient Name: [name],[name]      ASSESSMENT:   3 Days Post-Op S/P Procedure(s):  EXPLORATORY LAPAROTOMY, SMALL BOWEL RESECTION X 1    PLAN:     -Abdomen soft, non-tender, non-distended  -NG tube in place; / 24 hr output  -Clamp trial initiated; Clamp NG tube for 6 hours, if residual after 6 hours is less than and patient does not complain of nausea/vomiting/increased abdominal distention then pull NG tube and start CLD. If during clamp trial pt complains of nausea/vomiting or demonstrated increased abdominal distention then resume LCS and maintain NPO status. Nursing communication placed  -Pt denies bowel function  -Supportive care in place  -Encouraged UOOB, Ambulation and IS    Pt seen with MD Assadipour    SUBJECTIVE:   The patient is doing well.  Post operative pain is well controlled with medications.  Current dietary status:  Diet NPO effective now  Supervise For Meals Frequency: All meals and tolerating well.  Flatus: no. BM:  no.  Additional complaints: none       OBJECTIVE:   Current Vitals:   Vitals:    07/22/19 0739   BP: 146/74   Pulse: 99   Resp: 17   Temp: 98.8 F (37.1 C)   SpO2: 94%       Intake and Output Summary (Last 24 hours):  I/O last 3 completed shifts:  In: 2373.75 [I.V.:1873.75; IV Piggyback:500]  Out: 2850 [Urine:2400; Emesis/NG output:450]    Labs:     Results     Procedure Component Value Units Date/Time    Wound culture & gram stain [[id number]] Collected: 07/19/19 1800    Specimen: Wound from Abrasion Updated: 07/22/19 1100    Narrative:      ORDER#: [id number]                                    ORDERED BY: SCRIVEN, MATTHE  SOURCE: Abrasion abdomen                             COLLECTED:  07/19/19 18:00  ANTIBIOTICS AT COLL.:                                RECEIVED :  07/20/19 00:46  Stain, Gram                                FINAL       07/20/19 01:41  07/20/19   No Squamous epithelial cells  seen             Many WBCs             No organisms seen  Culture and Gram Stain, Aerobic, Wound     FINAL       07/22/19 11:00   +  07/22/19   Very light growth of Klebsiella pneumoniae      _____________________________________________________________________________                                  K.pneumoniae    ANTIBIOTICS  MIC  INTRP      _____________________________________________________________________________  Amoxicillin/CA                 <=4/2   S        Ampicillin                      16     [name]        Ampicillin/sulbactam            4/2    S        Aztreonam                       <=2    S        Cefazolin                       <=1    S        Cefepime                        <=1    S        Cefoxitin                       <=4    S        Ceftazidime                     <=2    S        Ceftriaxone                     <=1    S        Cefuroxime                      <=4    S        Ciprofloxacin                 <=0.25   S        Ertapenem                     <=0.25   S        Gentamicin                      <=2    S        Levofloxacin                   <=0.5   S        Meropenem                      <=0.5   S        Piperacillin/Tazobactam        <=2/4   S        Tetracycline                    <=2    S        Trimethoprim/Sulfamethoxazole <=0.5/9  S        _____________________________________________________________________________            S=SUSCEPTIBLE     I=INTERMEDIATE     [name]=RESISTANT  N/S=NON-SUSCEPTIBLE  _____________________________________________________________________________      Glucose Whole Blood - POCT [[id number]] Collected: 07/22/19 0804     Updated: 07/22/19 0807     Whole Blood Glucose POCT 97 mg/dL     GFR [[id number]] Collected: 07/22/19 0453     Updated: 07/22/19 0604     EGFR >60.0    Basic Metabolic Panel [[id number]]  (Abnormal) Collected: 07/22/19 0453    Specimen: Blood Updated: 07/22/19 0604     Glucose 94 mg/dL      BUN 18 mg/dL       Creatinine 0.8 mg/dL      Calcium 8.2 mg/dL      Sodium 756 mEq/L      Potassium 3.6 mEq/L      Chloride 107 mEq/L      CO2 15 mEq/L      Anion Gap 16.0    CBC and differential [[id number]]  (Abnormal) Collected: 07/22/19 0453    Specimen: Blood Updated: 07/22/19 0516     WBC 8.51 x10 3/uL      Hgb 10.6 g/dL      Hematocrit 41.6 %      Platelets 187 x10 3/uL      RBC 4.01 x10 6/uL      MCV 81.8 fL      MCH 26.4 pg      MCHC 32.3 g/dL      RDW 14 %      MPV 9.4 fL      Neutrophils 81.8 %      Lymphocytes Automated 9.4 %      Monocytes 5.6 %      Eosinophils Automated 2.0 %      Basophils Automated 0.4 %      Immature Granulocytes 0.8 %      Nucleated RBC 0.0 /100 WBC      Neutrophils Absolute 6.96 x10 3/uL      Lymphocytes Absolute Automated 0.80 x10 3/uL      Monocytes Absolute Automated 0.48 x10 3/uL      Eosinophils Absolute Automated 0.17 x10 3/uL      Basophils Absolute Automated 0.03 x10 3/uL      Immature Granulocytes Absolute 0.07 x10 3/uL      Absolute NRBC 0.00 x10 3/uL     Glucose Whole Blood - POCT [[id number]] Collected: 07/22/19 0400     Updated: 07/22/19 0410     Whole Blood Glucose POCT 86 mg/dL     Glucose Whole Blood - POCT [[id number]]  (Abnormal) Collected: 07/22/19 0008     Updated: 07/22/19 0011     Whole Blood Glucose POCT 105 mg/dL     Blood Culture Aerobic/Anaerobic #2 [[id number]] Collected: 07/19/19 1533    Specimen: Arm from Blood, Venipuncture Updated: 07/21/19 2321    Narrative:      ORDER#: [id number]                                    ORDERED BY: RHEE, JAMES  SOURCE: Blood, Venipuncture Arm                      COLLECTED:  07/19/19 15:33  ANTIBIOTICS AT COLL.:                                RECEIVED :  07/19/19 23:09  Culture Blood Aerobic and Anaerobic  PRELIM      07/21/19 23:21  07/21/19   No Growth after 1 day/s of incubation.             No Growth after 2 day/s of incubation.      Glucose Whole Blood - POCT [[id number]] Collected: 07/21/19 2004     Updated: 07/21/19 2016      Whole Blood Glucose POCT 96 mg/dL     Blood Culture Aerobic/Anaerobic #1 [[id number]] Collected: 07/19/19 1529    Specimen: Arm from Blood, Venipuncture Updated: 07/21/19 1921    Narrative:      ORDER#: [id number]                                    ORDERED BY: RHEE, JAMES  SOURCE: Blood, Venipuncture Arm                      COLLECTED:  07/19/19 15:29  ANTIBIOTICS AT COLL.:                                RECEIVED :  07/19/19 18:52  Culture Blood Aerobic and Anaerobic        PRELIM      07/21/19 19:21  07/21/19   No Growth after 1 day/s of incubation.             No Growth after 2 day/s of incubation.      Glucose Whole Blood - POCT [[id number]]  (Abnormal) Collected: 07/21/19 1557     Updated: 07/21/19 1602     Whole Blood Glucose POCT 106 mg/dL           Rads:     Radiology Results (24 Hour)     ** No results found for the last 24 hours. **          Physical Exam:     Mental status - alert, oriented to person, place, and time  Chest - no tachypnea, retractions or cyanosis  Heart - normal rate and regular rhythm  Abdomen - soft, nontender, nondistended, no masses or organomegaly  Wound - clean, dry, healing  Extremities - peripheral pulses normal, no pedal edema, no clubbing or cyanosis      Signed by: Tamala Bari

## 2019-07-22 NOTE — Progress Notes (Signed)
[facility]   HOSPITALIST  PROGRESS NOTE      Patient: [name]  Date: 07/22/2019   LOS: 3 Days  Admission Date: 07/19/2019   MRN: [id number]  Attending: Andi Hence, MD     ASSESSMENT/PLAN     LONA SIX is a 69 y.o. female admitted with       1.  Jejunal diverticulitis with perforation   S/p exploratory laparotomy with small bowel resection. POD 3  --Management per surgery   --NGT removed yesterday  --NPO until patient passes flatus   --pain management  --abx with Rocephin and flagyl   --IVF   -- patient educated on (low fiber) diet     2. DM type 2   She takes levemir 32U in AM and 24U in PM, Empagliflozin-Metformin at home   --holding long acting insulin as she is NPO   --continue ISS, AC   --will resume basal insulin when PO initiated and adjust insulin as needed     3. Hypothyroidism   --continue synthroid     4. GERD   --PPI IV daily     5. HTN   --on diltiazem   --holding home losartan. Can restart if BP stable    6. HLP   --continue statin      7. Sinus tachycardia. Resolved   -- monitor for now       Analgesia: oxycodone prn     Nutrition: NPO       Safety Checklist  DVT prophylaxis: Chemical   Foley: Not present   IVs:  Peripheral IV   PT/OT: Not needed   Daily CBC & or Chem ordered: Yes, due to clinical and lab instability       Patient Lines/Drains/Airways Status    Active PICC Line / CVC Line / PIV Line / Drain / Airway / Intraosseous Line / Epidural Line / ART Line / Line / Wound / Pressure Ulcer / NG/OG Tube     Name:   Placement date:   Placement time:   Site:   Days:    Peripheral IV 07/19/19 20 G Right Antecubital   07/19/19    1440    Antecubital   less than 1    Peripheral IV 07/19/19 20 G Left Antecubital   07/19/19    1452    Antecubital   less than 1    External Urinary Catheter   07/19/19    1850    --   less than 1    Wound 07/19/19 Surgical Incision Abdomen Other (Comment) gauze, staples, tape   07/19/19    1829    Abdomen   less than 1    NG/OG Tube Nasogastric Left nostril    07/19/19    1907    Left nostril   less than 1                MD/RN rounds: yes        Code Status: FULL CODE     DISPO: continue inpatient care    Family Contact: daughter     Care Plan discussed with nursing, consultants, case Production designer, theatre/television/film.       SUBJECTIVE     Patient seen and examined. Feels okay. No BM or passing gas yet. Some nausea and vomited overnight. No abdominal pain. Ambulated twice this morning     MEDICATIONS     Current Facility-Administered Medications   Medication Dose Route Frequency    acetaminophen  650 mg Oral Q6H  cefTRIAXone  2 g Intravenous Q24H    dilTIAZem  30 mg Oral BID    heparin (porcine)  5,000 Units Subcutaneous Q8H [facility]    insulin lispro  1-5 Units Subcutaneous Q4H [facility]    levothyroxine  50 mcg Oral Daily at 0600    metroNIDAZOLE  500 mg Intravenous Q6H    pantoprazole  40 mg Intravenous Daily    simvastatin  20 mg Oral QHS       ROS     Remainder of 10 point ROS as above or otherwise negative    PHYSICAL EXAM     Vitals:    07/22/19 1601   BP: 127/69   Pulse: 91   Resp: 16   Temp: 98.2 F (36.8 C)   SpO2: 92%       Temperature: Temp  Min: 98.2 F (36.8 C)  Max: 98.8 F (37.1 C)  Pulse: Pulse  Min: 89  Max: 105  Respiratory: Resp  Min: 16  Max: 17  Non-Invasive BP: BP  Min: 124/70  Max: 147/76  Pulse Oximetry SpO2  Min: 92 %  Max: 94 %    Intake and Output Summary (Last 24 hours) at Date Time    Intake/Output Summary (Last 24 hours) at 07/22/2019 1647  Last data filed at 07/22/2019 1601  Gross per 24 hour   Intake 1015 ml   Output 1550 ml   Net -535 ml       Constiutional: Patient speaks freely in full sentences. Sleepy but arouseable     HEENT: PERRL, no scleral icterus or conjunctival pallor, no nasal discharge, MMM, oropharynx without erythema or exudate  Neck: trachea midline, supple, no cervical or supraclavicular lymphadenopathy or masses  Cardiovascular: tachycardic, normal S1 S2, no murmurs  Respiratory: CTAB, no crackles or wheezing   Gastrointestinal: soft,  appropriate tenderness, midline incision looks c/d/i   Musculoskeletal: ROM and motor strength grossly normal. No edema   Skin: no rashes, jaundice or other lesions  Neurologic: EOMI, CN 2-12 grossly intact. no gross motor or sensory deficits  Psychiatric: affect and mood appropriate. The patient is alert, interactive, appropriate.    LABS     Recent Labs   Lab 07/22/19  0453 07/21/19  0938 07/20/19  0427   WBC 8.51 9.07 9.90*   RBC 4.01 3.83* 4.57   Hgb 10.6* 10.1* 11.8   Hematocrit 32.8* 31.4* 37.1   MCV 81.8 82.0 81.2   Platelets 187 182 195       Recent Labs   Lab 07/22/19  0453 07/21/19  0938 07/20/19  0427 07/19/19  1448   Sodium 138 139 138 139   Potassium 3.6 4.0 4.6 4.0   Chloride 107 108 107 104   CO2 15* 17* 21* 23   BUN 18 21* 16 19   Creatinine 0.8 0.9 0.9 1.1*   Glucose 94 114* 171* 122*   Calcium 8.2* 8.3* 8.4* 9.8       Recent Labs   Lab 07/19/19  1448   ALT 12   AST (SGOT) 15   Bilirubin, Total 0.3   Albumin 3.8   Alkaline Phosphatase 61       Recent Labs   Lab 07/19/19  1448   Troponin I <0.01             Microbiology Results     Procedure Component Value Units Date/Time    Anaerobic culture [[id number]] Collected: 07/19/19 1800    Specimen: Other from Abscess Updated: 07/20/19 0046  Blood Culture Aerobic/Anaerobic #1 [[id number]] Collected: 07/19/19 1529    Specimen: Arm from Blood, Venipuncture Updated: 07/19/19 1852    Narrative:      1 BLUE+1 PURPLE    Blood Culture Aerobic/Anaerobic #2 [[id number]] Collected: 07/19/19 1533    Specimen: Arm from Blood, Venipuncture Updated: 07/19/19 2309    Narrative:      1 BLUE+1 PURPLE    Wound culture & gram stain [[id number]] Collected: 07/19/19 1800    Specimen: Wound from Abrasion Updated: 07/20/19 0141    Narrative:      ORDER#: [id number]                                    ORDERED BY: SCRIVEN, MATTHE  SOURCE: Abrasion abdomen                             COLLECTED:  07/19/19 18:00  ANTIBIOTICS AT COLL.:                                RECEIVED :  07/20/19  00:46  Stain, Gram                                FINAL       07/20/19 01:41  07/20/19   No Squamous epithelial cells seen             Many WBCs             No organisms seen  Culture and Gram Stain, Aerobic, Wound     PENDING             RADIOLOGY     Radiological Procedure xray personally reviewed and concur with radiologist reports unless stated otherwise.    XR Abdomen Portable   Final Result      NG tube tip in stomach.                  Wilmon Pali, MD    07/21/2019 10:43 AM      CT Abd/ Pelvis without Contrast   Final Result    Swirling mesenteric vessels suggestive of an internal   hernia. There is jejunal thickening and adjacent mesenteric edema with   suspected contained perforation or abscess. Critical results discussed   with Dr. Lourdes Sledge at 3:25 PM.      Bosie Helper, MD    07/19/2019 3:32 PM          Signed,  Andi Hence, MD   4:47 PM 07/22/2019

## 2019-07-22 NOTE — OT Eval Note (Addendum)
[facility]   Occupational Therapy Evaluation     Patient: [name]    MRN#: [id number]   Unit: 5NEW ORTHOPEDICS  Bed: V409/W119-14    Attention Physicians:  For patients in an observation or outpatient status, Medicare requires the ordering provider to certify that this service is medically necessary.  Please co-sign this evaluation to indicate your agreement.  Thank you.    Time of treatment: Time Calculation  OT Received On: 07/22/19  Start Time: 0930  Stop Time: 0943  Time Calculation (min): 13 min    Consult received for [name] for OT Evaluation and Treatment.  Patients medical condition is appropriate for Occupational therapy intervention at this time.      OT Recommendations  Discharge Recommendation: Home with no needs  DME Recommended for Discharge: No additional equipment/DME recommended at this time    Assessment:   Pt is mod I with ADL tasks. No con't OT needs.   Discharge from therapy.    Interdisciplinary Communication   Patient is in bed and call bell/bed alarm set within reach.  Spoke with RN regarding results of evaluation.    Plan:   Discharge from Occupational Therapy.    Education:   Ed pt on role of OT, safety with ADLs, pain/edema management.    Evaluation:     Precautions and Contraindications:      PPE worn: procedural mask and gloves     Medical Diagnosis: Internal hernia [K45.8]    Rehab Diagnosis: pain in joint, generalized muscle weakness, decreased coordination    History of Present Illness: ZOEJANE GAULIN is a 69 y.o. female admitted on 07/19/2019 with   Procedure(s):  EXPLORATORY LAPAROTOMY, SMALL BOWEL RESECTION X 1    3 Days Post-Op  -------------------       Patient Active Problem List   Diagnosis    Internal hernia        Past Medical/Surgical History:  Past Medical History:   Diagnosis Date    Atrial fibrillation     Diabetes mellitus     Disorder of thyroid     Hypertension     Hypothyroid       Past Surgical History:   Procedure Laterality Date     APPENDECTOMY      BREAST LUMPECTOMY Left 2018    CHOLECYSTECTOMY      EXPLORATORY LAPAROTOMY N/A 07/19/2019    Procedure: EXPLORATORY LAPAROTOMY, SMALL BOWEL RESECTION X 1;  Surgeon: Talbert Nan, MD;  Location: [place] [facility];  Service: General;  Laterality: N/A;    HYSTERECTOMY          XR Abdomen Portable [[id number]] Collected: 07/21/19 1041    Order Status: Completed Updated: 07/21/19 1045    Narrative:     Reason for exam: 69 year old female for NG tube placement confirmation.     FINDINGS:   KUB. 1029.   NG tube is in place with tip in the stomach. Midline skin staples are in   place. Minimal air distention is seen in the bowel.     Impression:       NG tube tip in stomach.             Wilmon Pali, MD   07/21/2019 10:43 AM    CT Abd/ Pelvis without Contrast [[id number]] Collected: 07/19/19 1525    Order Status: Completed Updated: 07/19/19 1535    Narrative:     HISTORY: Abdominal pain.     TECHNIQUE: Noncontrast CT abdomen/pelvis.  The following dose reduction   techniques were utilized: Automated exposure control and/or adjustment   of the mA and/or kV according to patient size, and the use of iterative   reconstruction technique.     COMPARISON: None.     FINDINGS: There is a swirling pattern to the upper abdominal mesenteric   vessels suggestive of an internal hernia. While the bowel loops are   suboptimally evaluated without oral contrast, the proximal jejunal loops   are thickened with mild mesenteric fluid and edema. Just posterior to   one of the small bowel loops, there is a 3.7 x 2.4 cm mottled debris   collection suspicious for a contained perforation. This does not appear   to have a significant wall to suggest a giant diverticulum. A mottled   appearance to the ascending colon is likely due to stool rather than   pneumatosis. There is no bowel obstruction, free air, drainable fluid   collection or adenopathy.     The gallbladder has been resected. Noncontrast images of the  liver,   spleen, pancreas and adrenal glands and kidneys are normal. The uterus   is not identified. There is no suspicious adnexal abnormality within the   limits of CT. The abdominal aorta is normal caliber.     Impression:     Swirling mesenteric vessels suggestive of an internal   hernia. There is jejunal thickening and adjacent mesenteric edema with   suspected contained perforation or abscess. Critical results discussed   with Dr. Lourdes Sledge at 3:25 PM.     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

## 2019-07-23 DIAGNOSIS — Z794 Long term (current) use of insulin: Secondary | ICD-10-CM | POA: Diagnosis not present

## 2019-07-23 DIAGNOSIS — I10 Essential (primary) hypertension: Secondary | ICD-10-CM | POA: Diagnosis not present

## 2019-07-23 DIAGNOSIS — E119 Type 2 diabetes mellitus without complications: Secondary | ICD-10-CM | POA: Diagnosis not present

## 2019-07-23 DIAGNOSIS — K5700 Diverticulitis of small intestine with perforation and abscess without bleeding: Secondary | ICD-10-CM | POA: Diagnosis not present

## 2019-07-23 LAB — GLUCOSE WHOLE BLOOD - POCT
Whole Blood Glucose POCT: 112 mg/dL — ABNORMAL HIGH (ref 70–100)
Whole Blood Glucose POCT: 113 mg/dL — ABNORMAL HIGH (ref 70–100)
Whole Blood Glucose POCT: 118 mg/dL — ABNORMAL HIGH (ref 70–100)
Whole Blood Glucose POCT: 119 mg/dL — ABNORMAL HIGH (ref 70–100)
Whole Blood Glucose POCT: 124 mg/dL — ABNORMAL HIGH (ref 70–100)
Whole Blood Glucose POCT: 165 mg/dL — ABNORMAL HIGH (ref 70–100)
Whole Blood Glucose POCT: 96 mg/dL (ref 70–100)

## 2019-07-23 NOTE — Progress Notes (Signed)
POST OPERATIVE PROGRESS NOTE  [facility] [phone]    Date Time: 07/23/19 10:26 AM  Patient Name: EvelynEvelyn [name]      ASSESSMENT:   4 Days Post-Op S/P Procedure(s):  EXPLORATORY LAPAROTOMY, SMALL BOWEL RESECTION X 1    PLAN:     -Abdomen soft, non-tender, non-distended  -Clamp trial passed; CLD; maintain until advancement in bowel function  -Pt denies bowel function  -Supportive care in place  -Encouraged UOOB, Ambulation and IS    Pt seen with MD Zollie Beckers    SUBJECTIVE:   The patient is doing well.  Post operative pain is well controlled with medications.  Current dietary status:  Supervise For Meals Frequency: All meals  Diet clear liquid and tolerating well.  Flatus: no. BM:  no.  Additional complaints: none       OBJECTIVE:   Current Vitals:   Vitals:    07/23/19 0842   BP: 123/63   Pulse: 85   Resp:    Temp:    SpO2:        Intake and Output Summary (Last 24 hours):  I/O last 3 completed shifts:  In: 2640 [I.V.:1840; IV Piggyback:800]  Out: 2450 [Urine:2000; Emesis/NG output:450]    Labs:     Results     Procedure Component Value Units Date/Time    Anaerobic culture [[id number]] Collected: 07/19/19 1800    Specimen: Other from Abscess Updated: 07/23/19 0807    Narrative:      ORDER#: [id number]                                    ORDERED BY: SCRIVEN, MATTHE  SOURCE: Abscess abdomen                              COLLECTED:  07/19/19 18:00  ANTIBIOTICS AT COLL.:                                RECEIVED :  07/20/19 00:46  Culture, Anaerobic Bacteria                PRELIM      07/23/19 08:07  07/23/19   No growth to date, final report to follow      Glucose Whole Blood - POCT [[id number]]  (Abnormal) Collected: 07/23/19 0750     Updated: 07/23/19 0753     Whole Blood Glucose POCT 113 mg/dL     Glucose Whole Blood - POCT [[id number]] Collected: 07/23/19 0356     Updated: 07/23/19 0400     Whole Blood Glucose POCT 96 mg/dL     Glucose Whole Blood - POCT [[id number]]  (Abnormal) Collected: 07/23/19 0007     Updated: 07/23/19  0034     Whole Blood Glucose POCT 118 mg/dL     Blood Culture Aerobic/Anaerobic #2 [[id number]] Collected: 07/19/19 1533    Specimen: Arm from Blood, Venipuncture Updated: 07/22/19 2321    Narrative:      ORDER#: [id number]                                    ORDERED BY: RHEE, JAMES  SOURCE: Blood, Venipuncture Arm  COLLECTED:  07/19/19 15:33  ANTIBIOTICS AT COLL.:                                RECEIVED :  07/19/19 23:09  Culture Blood Aerobic and Anaerobic        PRELIM      07/22/19 23:21  07/21/19   No Growth after 1 day/s of incubation.             No Growth after 2 day/s of incubation.  07/22/19   No Growth after 3 day/s of incubation.      Glucose Whole Blood - POCT [[id number]]  (Abnormal) Collected: 07/22/19 2030     Updated: 07/22/19 2033     Whole Blood Glucose POCT 126 mg/dL     Blood Culture Aerobic/Anaerobic #1 [[id number]] Collected: 07/19/19 1529    Specimen: Arm from Blood, Venipuncture Updated: 07/22/19 1921    Narrative:      ORDER#: [id number]                                    ORDERED BY: RHEE, JAMES  SOURCE: Blood, Venipuncture Arm                      COLLECTED:  07/19/19 15:29  ANTIBIOTICS AT COLL.:                                RECEIVED :  07/19/19 18:52  Culture Blood Aerobic and Anaerobic        PRELIM      07/22/19 19:21  07/21/19   No Growth after 1 day/s of incubation.             No Growth after 2 day/s of incubation.  07/22/19   No Growth after 3 day/s of incubation.      Glucose Whole Blood - POCT [[id number]] Collected: 07/22/19 1558     Updated: 07/22/19 1604     Whole Blood Glucose POCT 93 mg/dL           Rads:     Radiology Results (24 Hour)     ** No results found for the last 24 hours. **          Physical Exam:     Mental status - alert, oriented to person, place, and time  Chest - no tachypnea, retractions or cyanosis  Heart - normal rate and regular rhythm  Abdomen - soft, nontender, nondistended, no masses or organomegaly  Wound - clean, dry, healing   Extremities - peripheral pulses normal, no pedal edema, no clubbing or cyanosis      Signed by: Tamala Bari

## 2019-07-23 NOTE — Plan of Care (Signed)
Patient resting in room with bed locked in lowest position and call bell within reach. A & O x4. On RA. Pain well controlled with PRN meds. Tolerating CLD, denies N/V. Voiding adequately. Reports passing gas, no BM. Ambulates stand by assist in hallway. All needs met at this time. Will continue with current plan of care.    Problem: Pain  Goal: Pain at adequate level as identified by patient  Outcome: Progressing  Flowsheets (Taken 07/23/2019 0128)  Pain at adequate level as identified by patient:   Identify patient comfort function goal   Assess for risk of opioid induced respiratory depression, including snoring/sleep apnea. Alert healthcare team of risk factors identified.   Reassess pain within 30-60 minutes of any procedure/intervention, per Pain Assessment, Intervention, Reassessment (AIR) Cycle   Assess pain on admission, during daily assessment and/or before any "as needed" intervention(s)   Evaluate if patient comfort function goal is met   Evaluate patient's satisfaction with pain management progress   Offer non-pharmacological pain management interventions   Include patient/patient care companion in decisions related to pain management as needed     Problem: Side Effects from Pain Analgesia  Goal: Patient will experience minimal side effects of analgesic therapy  Outcome: Progressing  Flowsheets (Taken 07/23/2019 0128)  Patient will experience minimal side effects of analgesic therapy:   Monitor/assess patient's respiratory status (RR depth, effort, breath sounds)   Assess for changes in cognitive function   Prevent/manage side effects per LIP orders (i.e. nausea, vomiting, pruritus, constipation, urinary retention, etc.)   Evaluate for opioid-induced sedation with appropriate assessment tool (i.e. POSS)     Problem: Moderate/High Fall Risk Score >5  Goal: Patient will remain free of falls  Outcome: Progressing  Flowsheets (Taken 07/22/2019 2000)  Moderate Risk (6-13):   MOD-Consider activation of bed alarm  if appropriate   MOD-Remain with patient during toileting   MOD-Utilize diversion activities   MOD-Perform dangle, stand, walk (DSW) when getting patient up   MOD-Request PT/OT consult order for patients with gait/mobility impairment   MOD-include family in multidisciplinary POC discussions     Problem: Altered GI Function  Goal: Fluid and electrolyte balance are achieved/maintained  Outcome: Progressing  Flowsheets (Taken 07/20/2019 1053 by Eusebio Me, RN)  Fluid and electrolyte balance are achieved/maintained:   Monitor intake and output every shift   Monitor/assess lab values and report abnormal values   Provide adequate hydration   Assess and reassess fluid and electrolyte status   Monitor for muscle weakness  Goal: Elimination patterns are normal or improving  Outcome: Progressing  Flowsheets (Taken 07/20/2019 1053 by Eusebio Me, RN)  Elimination patterns are normal or improving:   Report abnormal assessment to physician   Assess for normal bowel sounds   Monitor for abdominal distension   Monitor for abdominal discomfort   Administer treatments as ordered   Assess for flatus   Encourage /perform oral hygiene as appropriate  Goal: Nutritional intake is adequate  Outcome: Progressing  Flowsheets (Taken 07/20/2019 1053 by Eusebio Me, RN)  Nutritional intake is adequate: Encourage/administer dietary supplements as ordered (i.e. tube feed, TPN, oral, OGT/NGT, supplements)  Goal: Mobility/Activity is maintained at optimal level for patient  Outcome: Progressing  Flowsheets (Taken 07/20/2019 1053 by Eusebio Me, RN)  Mobility/activity is maintained at optimal level for patient:   Encourage independent activity per ability   Increase mobility as tolerated/progressive mobility  Goal: No bleeding  Outcome: Progressing  Flowsheets (Taken 07/22/2019 0304)  No bleeding: Monitor and assess vitals and  hemodynamic parameters

## 2019-07-23 NOTE — Progress Notes (Signed)
[facility]   HOSPITALIST  PROGRESS NOTE      Patient: [name]  Date: 07/23/2019   LOS: 4 Days  Admission Date: 07/19/2019   MRN: [id number]  Attending: Andi Hence, MD     ASSESSMENT/PLAN     [name] is a 69 y.o. female admitted with       1.  Jejunal diverticulitis with perforation   S/p exploratory laparotomy with small bowel resection. POD 4  --passing gas, no BM yet   --On clears, advance per surgery discretion   --further management per surgery team    2. DM type 2   She takes levemir 32U in AM and 24U in PM, Empagliflozin-Metformin at home   --holding long acting insulin and po meds for now until she is taking adequate po  --continue ISS, AC   --FS have been okay, continue to monitor     3. Hypothyroidism   --continue synthroid     4. GERD   --PPI IV daily     5. HTN   --on diltiazem   --holding home losartan. Can restart if BP stable    6. HLP   --continue statin      7. Sinus tachycardia. Resolved   -- monitor for now       Analgesia: oxycodone prn     Nutrition: Clears      Safety Checklist  DVT prophylaxis: Chemical   Foley: Not present   IVs:  Peripheral IV   PT/OT: Not needed   Daily CBC & or Chem ordered: Yes, due to clinical and lab instability       Patient Lines/Drains/Airways Status    Active PICC Line / CVC Line / PIV Line / Drain / Airway / Intraosseous Line / Epidural Line / ART Line / Line / Wound / Pressure Ulcer / NG/OG Tube     Name:   Placement date:   Placement time:   Site:   Days:    Peripheral IV 07/19/19 20 G Right Antecubital   07/19/19    1440    Antecubital   less than 1    Peripheral IV 07/19/19 20 G Left Antecubital   07/19/19    1452    Antecubital   less than 1    External Urinary Catheter   07/19/19    1850    --   less than 1    Wound 07/19/19 Surgical Incision Abdomen Other (Comment) gauze, staples, tape   07/19/19    1829    Abdomen   less than 1    NG/OG Tube Nasogastric Left nostril   07/19/19    1907    Left nostril   less than 1                MD/RN rounds:  yes        Code Status: FULL CODE     DISPO: continue inpatient care    Family Contact: daughter     Care Plan discussed with nursing, consultants, case Production designer, theatre/television/film.       SUBJECTIVE     Patient seen and examined. Feels fine, passing gas, no BM yet. Ambulating in hallways     MEDICATIONS     Current Facility-Administered Medications   Medication Dose Route Frequency    acetaminophen  650 mg Oral Q6H    dilTIAZem  30 mg Oral BID    heparin (porcine)  5,000 Units Subcutaneous Q8H [facility]    insulin lispro  1-5  Units Subcutaneous Q4H [facility]    levothyroxine  50 mcg Oral Daily at 0600    metroNIDAZOLE  500 mg Intravenous Q6H    pantoprazole  40 mg Intravenous Daily    simvastatin  20 mg Oral QHS       ROS     Remainder of 10 point ROS as above or otherwise negative    PHYSICAL EXAM     Vitals:    07/23/19 1154   BP: 137/77   Pulse: 82   Resp: 15   Temp: 98.1 F (36.7 C)   SpO2: 94%       Temperature: Temp  Min: 98.1 F (36.7 C)  Max: 98.4 F (36.9 C)  Pulse: Pulse  Min: 81  Max: 96  Respiratory: Resp  Min: 15  Max: 17  Non-Invasive BP: BP  Min: 121/71  Max: 162/88  Pulse Oximetry SpO2  Min: 92 %  Max: 96 %    Intake and Output Summary (Last 24 hours) at Date Time    Intake/Output Summary (Last 24 hours) at 07/23/2019 1223  Last data filed at 07/23/2019 0600  Gross per 24 hour   Intake 1625 ml   Output 1300 ml   Net 325 ml       Constiutional: Patient speaks freely in full sentences. Sleepy but arouseable     HEENT: PERRL, no scleral icterus or conjunctival pallor, no nasal discharge, MMM, oropharynx without erythema or exudate  Neck: trachea midline, supple, no cervical or supraclavicular lymphadenopathy or masses  Cardiovascular: tachycardic, normal S1 S2, no murmurs  Respiratory: CTAB, no crackles or wheezing   Gastrointestinal: soft, non distended, appropriate tenderness, midline incision looks c/d/i   Musculoskeletal: ROM and motor strength grossly normal. No edema   Skin: no rashes, jaundice or other  lesions  Neurologic: EOMI, CN 2-12 grossly intact. no gross motor or sensory deficits  Psychiatric: affect and mood appropriate. The patient is alert, interactive, appropriate.    LABS     Recent Labs   Lab 07/22/19  0453 07/21/19  0938 07/20/19  0427   WBC 8.51 9.07 9.90*   RBC 4.01 3.83* 4.57   Hgb 10.6* 10.1* 11.8   Hematocrit 32.8* 31.4* 37.1   MCV 81.8 82.0 81.2   Platelets 187 182 195       Recent Labs   Lab 07/22/19  0453 07/21/19  0938 07/20/19  0427 07/19/19  1448   Sodium 138 139 138 139   Potassium 3.6 4.0 4.6 4.0   Chloride 107 108 107 104   CO2 15* 17* 21* 23   BUN 18 21* 16 19   Creatinine 0.8 0.9 0.9 1.1*   Glucose 94 114* 171* 122*   Calcium 8.2* 8.3* 8.4* 9.8       Recent Labs   Lab 07/19/19  1448   ALT 12   AST (SGOT) 15   Bilirubin, Total 0.3   Albumin 3.8   Alkaline Phosphatase 61       Recent Labs   Lab 07/19/19  1448   Troponin I <0.01             Microbiology Results     Procedure Component Value Units Date/Time    Anaerobic culture [[id number]] Collected: 07/19/19 1800    Specimen: Other from Abscess Updated: 07/20/19 0046    Blood Culture Aerobic/Anaerobic #1 [[id number]] Collected: 07/19/19 1529    Specimen: Arm from Blood, Venipuncture Updated: 07/19/19 1852    Narrative:  1 BLUE+1 PURPLE    Blood Culture Aerobic/Anaerobic #2 [[id number]] Collected: 07/19/19 1533    Specimen: Arm from Blood, Venipuncture Updated: 07/19/19 2309    Narrative:      1 BLUE+1 PURPLE    Wound culture & gram stain [[id number]] Collected: 07/19/19 1800    Specimen: Wound from Abrasion Updated: 07/20/19 0141    Narrative:      ORDER#: [id number]                                    ORDERED BY: SCRIVEN, MATTHE  SOURCE: Abrasion abdomen                             COLLECTED:  07/19/19 18:00  ANTIBIOTICS AT COLL.:                                RECEIVED :  07/20/19 00:46  Stain, Gram                                FINAL       07/20/19 01:41  07/20/19   No Squamous epithelial cells seen             Many WBCs              No organisms seen  Culture and Gram Stain, Aerobic, Wound     PENDING             RADIOLOGY     Radiological Procedure xray personally reviewed and concur with radiologist reports unless stated otherwise.    XR Abdomen Portable   Final Result      NG tube tip in stomach.                  Wilmon Pali, MD    07/21/2019 10:43 AM      CT Abd/ Pelvis without Contrast   Final Result    Swirling mesenteric vessels suggestive of an internal   hernia. There is jejunal thickening and adjacent mesenteric edema with   suspected contained perforation or abscess. Critical results discussed   with Dr. Lourdes Sledge at 3:25 PM.      Bosie Helper, MD    07/19/2019 3:32 PM          Signed,  Andi Hence, MD   12:23 PM 07/23/2019

## 2019-07-23 NOTE — Plan of Care (Signed)
Patient is A/Ox4 on room air, VSS, up and ambulating to bathroom and hallway 1 assist. Patient is tolerating clear liquid diet, denies nausea or cramping. Pain has been managed with scheduled analgesics. Will continue to monitor.     Problem: Safety  Goal: Patient will be free from injury during hospitalization  Outcome: Progressing  Goal: Patient will be free from infection during hospitalization  Outcome: Progressing     Problem: Pain  Goal: Pain at adequate level as identified by patient  Outcome: Progressing     Problem: Moderate/High Fall Risk Score >5  Goal: Patient will remain free of falls  Outcome: Progressing     Problem: Altered GI Function  Goal: Fluid and electrolyte balance are achieved/maintained  Outcome: Progressing  Flowsheets (Taken 07/23/2019 1614)  Fluid and electrolyte balance are achieved/maintained:   Monitor intake and output every shift   Monitor/assess lab values and report abnormal values   Provide adequate hydration   Assess and reassess fluid and electrolyte status  Goal: Elimination patterns are normal or improving  Outcome: Progressing  Flowsheets (Taken 07/23/2019 1614)  Elimination patterns are normal or improving:   Report abnormal assessment to physician   Monitor for abdominal distension   Monitor for abdominal discomfort  Goal: Nutritional intake is adequate  Outcome: Progressing  Flowsheets (Taken 07/23/2019 1614)  Nutritional intake is adequate:   Assist patient with meals/food selection   Allow adequate time for meals   Include patient/patient care companion in decisions related to nutrition  Goal: Mobility/Activity is maintained at optimal level for patient  Outcome: Progressing  Flowsheets (Taken 07/23/2019 1614)  Mobility/activity is maintained at optimal level for patient:   Increase mobility as tolerated/progressive mobility   Encourage independent activity per ability   Maintain proper body alignment   Perform active/passive ROM

## 2019-07-24 DIAGNOSIS — E119 Type 2 diabetes mellitus without complications: Secondary | ICD-10-CM | POA: Diagnosis not present

## 2019-07-24 DIAGNOSIS — Z794 Long term (current) use of insulin: Secondary | ICD-10-CM | POA: Diagnosis not present

## 2019-07-24 DIAGNOSIS — I10 Essential (primary) hypertension: Secondary | ICD-10-CM | POA: Diagnosis not present

## 2019-07-24 DIAGNOSIS — K5700 Diverticulitis of small intestine with perforation and abscess without bleeding: Secondary | ICD-10-CM | POA: Diagnosis not present

## 2019-07-24 LAB — GLUCOSE WHOLE BLOOD - POCT
Whole Blood Glucose POCT: 110 mg/dL — ABNORMAL HIGH (ref 70–100)
Whole Blood Glucose POCT: 153 mg/dL — ABNORMAL HIGH (ref 70–100)
Whole Blood Glucose POCT: 160 mg/dL — ABNORMAL HIGH (ref 70–100)
Whole Blood Glucose POCT: 269 mg/dL — ABNORMAL HIGH (ref 70–100)
Whole Blood Glucose POCT: 169 mg/dL — ABNORMAL HIGH (ref 70–100)

## 2019-07-24 LAB — LAB USE ONLY - HISTORICAL SURGICAL PATHOLOGY

## 2019-07-24 MED ORDER — LOSARTAN POTASSIUM 25 MG PO TABS
25.0000 mg | ORAL_TABLET | Freq: Every day | ORAL | Status: DC
Start: 2019-07-24 — End: 2019-07-25
  Administered 2019-07-24 – 2019-07-25 (×2): 25 mg via ORAL
  Filled 2019-07-24 (×2): qty 1

## 2019-07-24 NOTE — Progress Notes (Signed)
POST OPERATIVE PROGRESS NOTE  [facility] [phone]    Date Time: 07/24/19 8:49 AM  Patient Name: [name],[name]      ASSESSMENT:   5 Days Post-Op S/P Procedure(s):  EXPLORATORY LAPAROTOMY, SMALL BOWEL RESECTION X 1   - jejunal diverticulitis with perforation noted intra-op    Passed NG clamp trial yesterday. Tolerating some clear liquids last night without nausea/vomiting or increased abdominal distention. Passing flatus, no BM as of yet. Abdomen soft with minimal gaseous distention. Ambulating, AVSS.   PLAN:   - continue clear liquids for breakfast, possible advancement to full liquids at lunch  - completed course of post-op abx  - supportive care in place  - hospitalist assistance appreciated with comorbid management.  - SQ heparin, bilateral SCDs    * Patient seen with Dr. Joanna Puff    Patient has had 2 BMs, advanced to full liquid diet for lunch. Likely low fiber tomorrow and home. Suezanne Jacquet, PA-C    SUBJECTIVE:   The patient is doing well.  Post operative pain is well controlled with medications.  Current dietary status:  Supervise For Meals Frequency: All meals  Diet clear liquid and tolerating well.  Flatus: yes. BM:  no.  Additional complaints: none       OBJECTIVE:   Current Vitals:   Vitals:    07/24/19 0750   BP: 144/78   Pulse: 80   Resp: 17   Temp: 98.2 F (36.8 C)   SpO2: 96%       Intake and Output Summary (Last 24 hours):  I/O last 3 completed shifts:  In: 4054.25 [P.O.:600; I.V.:2654.25; IV Piggyback:800]  Out: 3000 [Urine:3000]    Labs:     Results     Procedure Component Value Units Date/Time    Glucose Whole Blood - POCT [[id number]]  (Abnormal) Collected: 07/24/19 0826     Updated: 07/24/19 0828     Whole Blood Glucose POCT 153 mg/dL     Glucose Whole Blood - POCT [[id number]]  (Abnormal) Collected: 07/24/19 0429     Updated: 07/24/19 0443     Whole Blood Glucose POCT 110 mg/dL     Glucose Whole Blood - POCT [[id number]]  (Abnormal) Collected: 07/23/19 2314     Updated: 07/23/19 2356      Whole Blood Glucose POCT 124 mg/dL     Blood Culture Aerobic/Anaerobic #2 [[id number]] Collected: 07/19/19 1533    Specimen: Arm from Blood, Venipuncture Updated: 07/23/19 2321    Narrative:      ORDER#: [id number]                                    ORDERED BY: RHEE, JAMES  SOURCE: Blood, Venipuncture Arm                      COLLECTED:  07/19/19 15:33  ANTIBIOTICS AT COLL.:                                RECEIVED :  07/19/19 23:09  Culture Blood Aerobic and Anaerobic        PRELIM      07/23/19 23:21  07/21/19   No Growth after 1 day/s of incubation.             No Growth after 2 day/s of incubation.  07/22/19   No Growth after 3  day/s of incubation.  07/23/19   No Growth after 4 day/s of incubation.      Glucose Whole Blood - POCT [[id number]]  (Abnormal) Collected: 07/23/19 1945     Updated: 07/23/19 1951     Whole Blood Glucose POCT 165 mg/dL     Blood Culture Aerobic/Anaerobic #1 [[id number]] Collected: 07/19/19 1529    Specimen: Arm from Blood, Venipuncture Updated: 07/23/19 1921    Narrative:      ORDER#: [id number]                                    ORDERED BY: RHEE, JAMES  SOURCE: Blood, Venipuncture Arm                      COLLECTED:  07/19/19 15:29  ANTIBIOTICS AT COLL.:                                RECEIVED :  07/19/19 18:52  Culture Blood Aerobic and Anaerobic        PRELIM      07/23/19 19:21  07/21/19   No Growth after 1 day/s of incubation.             No Growth after 2 day/s of incubation.  07/22/19   No Growth after 3 day/s of incubation.  07/23/19   No Growth after 4 day/s of incubation.      Glucose Whole Blood - POCT [[id number]]  (Abnormal) Collected: 07/23/19 1609     Updated: 07/23/19 1614     Whole Blood Glucose POCT 112 mg/dL           Rads:     Radiology Results (24 Hour)     ** No results found for the last 24 hours. **          Physical Exam:     Mental status - alert, oriented to person, place, and time  Chest - no tachypnea, retractions or cyanosis, breathing comfortably on room air,  audibly clear respirations  Heart - normal rate and regular rhythm  Abdomen - soft, minimal gaseous distention, non-tender, no rebound tenderness or guarding  Wound - clean, dry, no drainage  Extremities - no pedal edema noted, warm, well perfused      Signed by: Ruel Favors

## 2019-07-24 NOTE — Plan of Care (Signed)
Moderate fall risk. Bed in lowest position. Bed alarm on. 3/4 side rails up. Pt instructed to use call bell for help with ambulation. SBA with walker. Q4 blood sugars. A&O x4. Bengali speaking. Family members have been at bedside translating. On room air. Passing flatus and has had bowel movements overnight. Denies nausea. Voids. Midline staples are OTA C/D/I. Fluids running per orders. PRN oxycodone given with relief.     Problem: Pain  Goal: Pain at adequate level as identified by patient  Outcome: Progressing  Flowsheets (Taken 07/23/2019 0128 by Burman Freestone, RN)  Pain at adequate level as identified by patient:   Identify patient comfort function goal   Assess for risk of opioid induced respiratory depression, including snoring/sleep apnea. Alert healthcare team of risk factors identified.   Reassess pain within 30-60 minutes of any procedure/intervention, per Pain Assessment, Intervention, Reassessment (AIR) Cycle   Assess pain on admission, during daily assessment and/or before any "as needed" intervention(s)   Evaluate if patient comfort function goal is met   Evaluate patient's satisfaction with pain management progress   Offer non-pharmacological pain management interventions   Include patient/patient care companion in decisions related to pain management as needed     Problem: Psychosocial and Spiritual Needs  Goal: Demonstrates ability to cope with hospitalization/illness  Outcome: Progressing  Flowsheets (Taken 07/20/2019 1053 by Eusebio Me, RN)  Demonstrates ability to cope with hospitalizations/illness:   Encourage verbalization of feelings/concerns/expectations   Assist patient to identify own strengths and abilities   Encourage participation in diversional activity   Include patient/ patient care companion in decisions   Provide quiet environment   Encourage patient to set small goals for self   Reinforce positive adaptation of new coping behaviors     Problem: Moderate/High Fall Risk Score  >5  Goal: Patient will remain free of falls  Outcome: Progressing  Flowsheets (Taken 07/24/2019 2200)  Moderate Risk (6-13):   LOW-Fall Interventions Appropriate for Low Fall Risk   MOD-Remain with patient during toileting   MOD-Consider a move closer to Nurses Station   MOD-Consider activation of bed alarm if appropriate   MOD-Perform dangle, stand, walk (DSW) when getting patient up

## 2019-07-24 NOTE — Progress Notes (Signed)
[facility]   HOSPITALIST  PROGRESS NOTE      Patient: [name]  Date: 07/24/2019   LOS: 5 Days  Admission Date: 07/19/2019   MRN: [id number]  Attending: Andi Hence, MD     ASSESSMENT/PLAN     [name] is a 68 y.o. female admitted with       1.  Jejunal diverticulitis with perforation   S/p exploratory laparotomy with small bowel resection. POD 5  --passing gas, no BM yet   --On clears, advance per surgery discretion   --further management per surgery team    2. DM type 2   She takes levemir 32U in AM and 24U in PM, Empagliflozin-Metformin at home   --holding long acting insulin and po meds for now until she is taking adequate po  --continue ISS, AC   --FS have been okay, continue to monitor     3. Hypothyroidism   --continue synthroid     4. GERD   --PPI IV daily     5. HTN   --on diltiazem   --holding home losartan. Can restart if BP stable    6. HLP   --continue statin      7. Sinus tachycardia. Resolved   -- monitor for now       Analgesia: oxycodone prn     Nutrition: Clears      Safety Checklist  DVT prophylaxis: Chemical   Foley: Not present   IVs:  Peripheral IV   PT/OT: Not needed   Daily CBC & or Chem ordered: Yes, due to clinical and lab instability       Patient Lines/Drains/Airways Status    Active PICC Line / CVC Line / PIV Line / Drain / Airway / Intraosseous Line / Epidural Line / ART Line / Line / Wound / Pressure Ulcer / NG/OG Tube     Name:   Placement date:   Placement time:   Site:   Days:    Peripheral IV 07/19/19 20 G Right Antecubital   07/19/19    1440    Antecubital   less than 1    Peripheral IV 07/19/19 20 G Left Antecubital   07/19/19    1452    Antecubital   less than 1    External Urinary Catheter   07/19/19    1850    --   less than 1    Wound 07/19/19 Surgical Incision Abdomen Other (Comment) gauze, staples, tape   07/19/19    1829    Abdomen   less than 1    NG/OG Tube Nasogastric Left nostril   07/19/19    1907    Left nostril   less than 1                MD/RN rounds:  yes        Code Status: FULL CODE     DISPO: continue inpatient care    Family Contact: daughter     Care Plan discussed with nursing, consultants, case Production designer, theatre/television/film.       SUBJECTIVE     Patient seen and examined. Feels fine, passing gas, no BM yet. No new complaints     MEDICATIONS     Current Facility-Administered Medications   Medication Dose Route Frequency    acetaminophen  650 mg Oral Q6H    dilTIAZem  30 mg Oral BID    heparin (porcine)  5,000 Units Subcutaneous Q8H SCH    insulin lispro  1-5  Units Subcutaneous Q4H [facility]    levothyroxine  50 mcg Oral Daily at 0600    pantoprazole  40 mg Intravenous Daily    simvastatin  20 mg Oral QHS       ROS     Remainder of 10 point ROS as above or otherwise negative    PHYSICAL EXAM     Vitals:    07/24/19 1218   BP: 135/79   Pulse: 77   Resp: 17   Temp: 97.9 F (36.6 C)   SpO2: 95%       Temperature: Temp  Min: 97.9 F (36.6 C)  Max: 98.6 F (37 C)  Pulse: Pulse  Min: 77  Max: 83  Respiratory: Resp  Min: 15  Max: 17  Non-Invasive BP: BP  Min: 128/76  Max: 147/71  Pulse Oximetry SpO2  Min: 95 %  Max: 96 %    Intake and Output Summary (Last 24 hours) at Date Time    Intake/Output Summary (Last 24 hours) at 07/24/2019 1410  Last data filed at 07/24/2019 1325  Gross per 24 hour   Intake 2729.25 ml   Output 2650 ml   Net 79.25 ml       Constiutional: Patient speaks freely in full sentences. Sleepy but arouseable. Daughter at bedside     HEENT: PERRL, no scleral icterus or conjunctival pallor, no nasal discharge, MMM, oropharynx without erythema or exudate  Neck: trachea midline, supple, no cervical or supraclavicular lymphadenopathy or masses  Cardiovascular: tachycardic, normal S1 S2, no murmurs  Respiratory: CTAB, no crackles or wheezing   Gastrointestinal: soft, non distended, appropriate tenderness, midline incision looks c/d/i   Musculoskeletal: ROM and motor strength grossly normal. No edema   Skin: no rashes, jaundice or other lesions  Neurologic: EOMI, CN 2-12  grossly intact. no gross motor or sensory deficits  Psychiatric: affect and mood appropriate. The patient is alert, interactive, appropriate.    LABS     Recent Labs   Lab 07/22/19  0453 07/21/19  0938 07/20/19  0427   WBC 8.51 9.07 9.90*   RBC 4.01 3.83* 4.57   Hgb 10.6* 10.1* 11.8   Hematocrit 32.8* 31.4* 37.1   MCV 81.8 82.0 81.2   Platelets 187 182 195       Recent Labs   Lab 07/22/19  0453 07/21/19  0938 07/20/19  0427 07/19/19  1448   Sodium 138 139 138 139   Potassium 3.6 4.0 4.6 4.0   Chloride 107 108 107 104   CO2 15* 17* 21* 23   BUN 18 21* 16 19   Creatinine 0.8 0.9 0.9 1.1*   Glucose 94 114* 171* 122*   Calcium 8.2* 8.3* 8.4* 9.8       Recent Labs   Lab 07/19/19  1448   ALT 12   AST (SGOT) 15   Bilirubin, Total 0.3   Albumin 3.8   Alkaline Phosphatase 61       Recent Labs   Lab 07/19/19  1448   Troponin I <0.01             Microbiology Results     Procedure Component Value Units Date/Time    Anaerobic culture [[id number]] Collected: 07/19/19 1800    Specimen: Other from Abscess Updated: 07/20/19 0046    Blood Culture Aerobic/Anaerobic #1 [[id number]] Collected: 07/19/19 1529    Specimen: Arm from Blood, Venipuncture Updated: 07/19/19 1852    Narrative:      1 BLUE+1 PURPLE  Blood Culture Aerobic/Anaerobic #2 [[id number]] Collected: 07/19/19 1533    Specimen: Arm from Blood, Venipuncture Updated: 07/19/19 2309    Narrative:      1 BLUE+1 PURPLE    Wound culture & gram stain [[id number]] Collected: 07/19/19 1800    Specimen: Wound from Abrasion Updated: 07/20/19 0141    Narrative:      ORDER#: [id number]                                    ORDERED BY: SCRIVEN, MATTHE  SOURCE: Abrasion abdomen                             COLLECTED:  07/19/19 18:00  ANTIBIOTICS AT COLL.:                                RECEIVED :  07/20/19 00:46  Stain, Gram                                FINAL       07/20/19 01:41  07/20/19   No Squamous epithelial cells seen             Many WBCs             No organisms seen  Culture and Gram  Stain, Aerobic, Wound     PENDING             RADIOLOGY     Radiological Procedure xray personally reviewed and concur with radiologist reports unless stated otherwise.    XR Abdomen Portable   Final Result      NG tube tip in stomach.                  Wilmon Pali, MD    07/21/2019 10:43 AM      CT Abd/ Pelvis without Contrast   Final Result    Swirling mesenteric vessels suggestive of an internal   hernia. There is jejunal thickening and adjacent mesenteric edema with   suspected contained perforation or abscess. Critical results discussed   with Dr. Lourdes Sledge at 3:25 PM.      Bosie Helper, MD    07/19/2019 3:32 PM          Signed,  Andi Hence, MD   2:10 PM 07/24/2019

## 2019-07-25 DIAGNOSIS — E119 Type 2 diabetes mellitus without complications: Secondary | ICD-10-CM | POA: Diagnosis not present

## 2019-07-25 DIAGNOSIS — I10 Essential (primary) hypertension: Secondary | ICD-10-CM | POA: Diagnosis not present

## 2019-07-25 DIAGNOSIS — Z794 Long term (current) use of insulin: Secondary | ICD-10-CM | POA: Diagnosis not present

## 2019-07-25 DIAGNOSIS — K5700 Diverticulitis of small intestine with perforation and abscess without bleeding: Secondary | ICD-10-CM | POA: Diagnosis not present

## 2019-07-25 LAB — CBC AND DIFFERENTIAL
Absolute NRBC: 0 10*3/uL (ref 0.00–0.00)
Basophils Absolute Automated: 0.04 10*3/uL (ref 0.00–0.08)
Basophils Automated: 0.6 %
Eosinophils Absolute Automated: 0.37 10*3/uL (ref 0.00–0.44)
Eosinophils Automated: 5.4 %
Hematocrit: 31.1 % — ABNORMAL LOW (ref 34.7–43.7)
Hgb: 10.1 g/dL — ABNORMAL LOW (ref 11.4–14.8)
Immature Granulocytes Absolute: 0.13 10*3/uL — ABNORMAL HIGH (ref 0.00–0.07)
Immature Granulocytes: 1.9 %
Lymphocytes Absolute Automated: 1.79 10*3/uL (ref 0.42–3.22)
Lymphocytes Automated: 26.3 %
MCH: 25.5 pg (ref 25.1–33.5)
MCHC: 32.5 g/dL (ref 31.5–35.8)
MCV: 78.5 fL (ref 78.0–96.0)
MPV: 9.5 fL (ref 8.9–12.5)
Monocytes Absolute Automated: 0.62 10*3/uL (ref 0.21–0.85)
Monocytes: 9.1 %
Neutrophils Absolute: 3.86 10*3/uL (ref 1.10–6.33)
Neutrophils: 56.7 %
Nucleated RBC: 0 /100 WBC (ref 0.0–0.0)
Platelets: 219 10*3/uL (ref 142–346)
RBC: 3.96 10*6/uL (ref 3.90–5.10)
RDW: 14 % (ref 11–15)
WBC: 6.81 10*3/uL (ref 3.10–9.50)

## 2019-07-25 LAB — BASIC METABOLIC PANEL
Anion Gap: 14 (ref 5.0–15.0)
BUN: 8 mg/dL (ref 7–19)
CO2: 21 mEq/L — ABNORMAL LOW (ref 22–29)
Calcium: 8.1 mg/dL — ABNORMAL LOW (ref 8.5–10.5)
Chloride: 107 mEq/L (ref 100–111)
Creatinine: 0.8 mg/dL (ref 0.6–1.0)
Glucose: 146 mg/dL — ABNORMAL HIGH (ref 70–100)
Potassium: 3 mEq/L — ABNORMAL LOW (ref 3.5–5.1)
Sodium: 142 mEq/L (ref 136–145)

## 2019-07-25 LAB — GLUCOSE WHOLE BLOOD - POCT
Whole Blood Glucose POCT: 141 mg/dL — ABNORMAL HIGH (ref 70–100)
Whole Blood Glucose POCT: 165 mg/dL — ABNORMAL HIGH (ref 70–100)
Whole Blood Glucose POCT: 168 mg/dL — ABNORMAL HIGH (ref 70–100)
Whole Blood Glucose POCT: 179 mg/dL — ABNORMAL HIGH (ref 70–100)

## 2019-07-25 LAB — GFR: EGFR: >60

## 2019-07-25 MED ORDER — ACETAMINOPHEN 325 MG PO TABS
650.00 mg | ORAL_TABLET | Freq: Four times a day (QID) | ORAL | Status: AC
Start: 2019-07-25 — End: ?

## 2019-07-25 MED ORDER — POTASSIUM CHLORIDE 20 MEQ PO PACK
40.00 meq | PACK | Freq: Once | ORAL | Status: AC
Start: 2019-07-25 — End: 2019-07-25
  Administered 2019-07-25: 07:00:00 40 meq via ORAL
  Filled 2019-07-25: qty 2

## 2019-07-25 MED ORDER — OXYCODONE HCL 5 MG PO TABS
5.00 mg | ORAL_TABLET | Freq: Four times a day (QID) | ORAL | 0 refills | Status: AC | PRN
Start: 2019-07-25 — End: 2019-08-01

## 2019-07-25 NOTE — Progress Notes (Incomplete)
Nutrition Assessment    [name] 69 y.o. female   MRN: [id number]      Reason for Assessment: LOS (day 6)    Nutrition Recommendation:   ***      ______________________________________________________________________      Assessment Data:  Summary:   69 y.o. female presents with abdominal pain and found to have internal hernia with perforation. PMHx of T2DM, HTN, hypothyroidism. Pt was taken to OR upon admit for exploratory laparotomy, and found to have jejunal diverticulitis with perforation. NG tube placed 6/26 - removed 6/29.      Adm dx:  Internal hernia   Patient Active Problem List   Diagnosis   . Internal hernia       PMH:  has a past medical history of Atrial fibrillation, Diabetes mellitus, Disorder of thyroid, Hypertension, and Hypothyroid.    Recent Labs   Lab 07/25/19  0412 07/22/19  0453 07/21/19  0938 07/20/19  0427 07/19/19  1448   Sodium 142 138 139 138 139   Potassium 3.0* 3.6 4.0 4.6 4.0   Chloride 107 107 108 107 104   CO2 21* 15* 17* 21* 23   BUN 8 18 21* 16 19   Creatinine 0.8 0.8 0.9 0.9 1.1*   Glucose 146* 94 114* 171* 122*   Calcium 8.1* 8.2* 8.3* 8.4* 9.8   EGFR >60.0 >60.0 >60.0 >60.0 49.2   WBC 6.81 8.51 9.07 9.90* 9.60*   Hematocrit 31.1* 32.8* 31.4* 37.1 41.3   Hgb 10.1* 10.6* 10.1* 11.8 13.1   Lipase  --   --   --   --  36   Hemoglobin A1C  --   --   --  7.4*  --       Ref. Range 07/24/2019 04:29 07/24/2019 08:26 07/24/2019 12:16 07/24/2019 16:13 07/24/2019 20:00 07/25/2019 00:39 07/25/2019 04:37 07/25/2019 08:42   Whole Blood Glucose POCT Latest Ref Range: 70 - 100 mg/dL 540 (H) 981 (H) 191 (H) 269 (H) 169 (H) 179 (H) 141 (H) 168 (H)       Current Facility-Administered Medications   Medication Dose Route Frequency   . acetaminophen  650 mg Oral Q6H   . dilTIAZem  30 mg Oral BID   . heparin (porcine)  5,000 Units Subcutaneous Q8H SCH   . insulin lispro  1-5 Units Subcutaneous Q4H SCH   . levothyroxine  50 mcg Oral Daily at 0600   . losartan  25 mg Oral Daily   . pantoprazole  40 mg Intravenous  Daily   . simvastatin  20 mg Oral QHS       PRN meds given in the past 48 hours: oxycodone    Social History:           Intake History:                                                                             Orders Placed This Encounter      Diet low fiber    Orders Placed This Encounter   Procedures   . Diet low fiber       Food intake:     Allergies   Allergen Reactions   . Penicillins  Pt states she collapses   . Sulfa Antibiotics Itching       Nutrition Focused Physical Exam:  Head -  Upper Body - {NFPE UPPER BODY:45743}  Lower Body - {NFPE LOWER BODY:45745}  Edema -  Skin -  GI function -    Learning Needs:     Anthropometrics  Height: 157.5 cm (5\' 2" )  Weight: 63.5 kg (140 lb)  Weight Change: -2.9  IBW/kg (Calculated) Female: 53.64 kg  IBW/kg (Calculated) Female: 50 kg  BMI (calculated): 25.7    Total Daily Energy Needs:            Total Daily Protein Needs:            Total Daily Fluid Needs:            Nutrition Diagnosis:   {IHS DIAGNOSIS INTAKE:30423388} related to *** as evidenced by ***  {IHS DIAGNOSIS CLINICAL:30423389} related to *** as evidenced by ***  {IHS DIAGNOSIS BEHAVIORAL ENVIRONMENTAL:30423390} related to *** as evidenced by ***    Intervention:  Goal:  Plan:    Monitoring/Evaluation:   1. PO intake  2. Weights  3. GI symptoms      Lyndel Pleasure, RD

## 2019-07-25 NOTE — Discharge Summary (Signed)
@[email or web address] @     Discharge Summary    Date:07/25/2019   Patient Name: [name],[name]  Attending Physician: Talbert Nan, MD    Date of Admission:   07/19/2019    Date of Discharge:    07/25/2019    Admitting Diagnosis:   Internal hernia [K45.8]      Problems:   Problem Lists:  Patient Active Problem List   Diagnosis    Internal hernia         Procedure(s):  EXPLORATORY LAPAROTOMY, SMALL BOWEL RESECTION X 1    6 Days Post-Op  -------------------         Discharge Dx:     Principal Diagnosis (Diagnosis after study, that is chiefly responsible for admission to inpatient status):   - jejunal diverticulitis with perforation      Treatment Team:   Treatment Team:   Attending Provider: Talbert Nan, MD  Consulting Physician: Cindee Lame, MD     Procedures performed:   CT Abd/ Pelvis without Contrast    Result Date: 07/19/2019   Swirling mesenteric vessels suggestive of an internal hernia. There is jejunal thickening and adjacent mesenteric edema with suspected contained perforation or abscess. Critical results discussed with Dr. Lourdes Sledge at 3:25 PM. Bosie Helper, MD  07/19/2019 3:32 PM    XR Abdomen Portable    Result Date: 07/21/2019  NG tube tip in stomach. Wilmon Pali, MD  07/21/2019 10:43 AM      Procedure(s):  EXPLORATORY LAPAROTOMY, SMALL BOWEL RESECTION X 1      Reason for Admission:     Abdominal pain, possible internal hernia    Hospital Course:     The patient presented to the hospital for evaluation of abdominal pain. She was noted to have a possible internal hernia on initial imaging and was taken to the OR for surgical intervention.  She tolerated surgery well and was managed post-operatively on the surgical floor. Prior to discharge she has been ambulatory, tolerating PO intake, and demonstrating return of bowel function. She  will be discharged home in stable condition. Her hospital stay has been uncomplicated. Close outpatient follow up will be arranged.       Condition at Discharge:     Stable       Today:     BP 138/77    Pulse 81    Temp 98.4 F (36.9 C) (Oral)    Resp (!) 24    Ht 1.575 m (5\' 2" )    Wt 63.5 kg (140 lb)    SpO2 97%    BMI 25.61 kg/m   Ranges for the last 24 hours:  Temp:  [97.9 F (36.6 C)-98.4 F (36.9 C)] 98.4 F (36.9 C)  Heart Rate:  [66-85] 81  Resp Rate:  [16-24] 24  BP: (125-138)/(73-80) 138/77    Last set of labs   Results     Procedure Component Value Units Date/Time    Glucose Whole Blood - POCT [[id number]]  (Abnormal) Collected: 07/25/19 0842     Updated: 07/25/19 0849     Whole Blood Glucose POCT 168 mg/dL     Basic Metabolic Panel [[id number]]  (Abnormal) Collected: 07/25/19 0412    Specimen: Blood Updated: 07/25/19 0540     Glucose 146 mg/dL      BUN 8 mg/dL      Creatinine 0.8 mg/dL      Calcium 8.1 mg/dL      Sodium 102 mEq/L      Potassium 3.0 mEq/L  Chloride 107 mEq/L      CO2 21 mEq/L      Anion Gap 14.0    GFR [[id number]] Collected: 07/25/19 0412     Updated: 07/25/19 0540     EGFR >60.0    CBC and differential [[id number]]  (Abnormal) Collected: 07/25/19 0412    Specimen: Blood Updated: 07/25/19 0502     WBC 6.81 x10 3/uL      Hgb 10.1 g/dL      Hematocrit 74.2 %      Platelets 219 x10 3/uL      RBC 3.96 x10 6/uL      MCV 78.5 fL      MCH 25.5 pg      MCHC 32.5 g/dL      RDW 14 %      MPV 9.5 fL      Neutrophils 56.7 %      Lymphocytes Automated 26.3 %      Monocytes 9.1 %      Eosinophils Automated 5.4 %      Basophils Automated 0.6 %      Immature Granulocytes 1.9 %      Nucleated RBC 0.0 /100 WBC      Neutrophils Absolute 3.86 x10 3/uL      Lymphocytes Absolute Automated 1.79 x10 3/uL      Monocytes Absolute Automated 0.62 x10 3/uL      Eosinophils Absolute Automated 0.37 x10 3/uL      Basophils Absolute Automated 0.04 x10 3/uL      Immature Granulocytes Absolute 0.13 x10 3/uL      Absolute NRBC 0.00 x10 3/uL     Glucose Whole Blood - POCT [[id number]]  (Abnormal) Collected: 07/25/19 0437     Updated: 07/25/19 0455     Whole Blood Glucose POCT 141 mg/dL      Blood Culture Aerobic/Anaerobic #2 [[id number]] Collected: 07/19/19 1533    Specimen: Arm from Blood, Venipuncture Updated: 07/25/19 0121    Narrative:      ORDER#: [id number]                                    ORDERED BY: RHEE, JAMES  SOURCE: Blood, Venipuncture Arm                      COLLECTED:  07/19/19 15:33  ANTIBIOTICS AT COLL.:                                RECEIVED :  07/19/19 23:09  Culture Blood Aerobic and Anaerobic        FINAL       07/25/19 01:21  07/25/19   No growth after 5 days of incubation.      Glucose Whole Blood - POCT [[id number]]  (Abnormal) Collected: 07/25/19 0039     Updated: 07/25/19 0045     Whole Blood Glucose POCT 179 mg/dL     Blood Culture Aerobic/Anaerobic #1 [[id number]] Collected: 07/19/19 1529    Specimen: Arm from Blood, Venipuncture Updated: 07/24/19 2121    Narrative:      ORDER#: [id number]                                    ORDERED BY: RHEE, JAMES  SOURCE: Blood, Venipuncture Arm  COLLECTED:  07/19/19 15:29  ANTIBIOTICS AT COLL.:                                RECEIVED :  07/19/19 18:52  Culture Blood Aerobic and Anaerobic        FINAL       07/24/19 21:21  07/24/19   No growth after 5 days of incubation.      Glucose Whole Blood - POCT [[id number]]  (Abnormal) Collected: 07/24/19 2000     Updated: 07/24/19 2003     Whole Blood Glucose POCT 169 mg/dL     Glucose Whole Blood - POCT [[id number]]  (Abnormal) Collected: 07/24/19 1613     Updated: 07/24/19 1619     Whole Blood Glucose POCT 269 mg/dL     Glucose Whole Blood - POCT [[id number]]  (Abnormal) Collected: 07/24/19 1216     Updated: 07/24/19 1220     Whole Blood Glucose POCT 160 mg/dL     Glucose Whole Blood - POCT [[id number]]  (Abnormal) Collected: 07/24/19 0826     Updated: 07/24/19 0828     Whole Blood Glucose POCT 153 mg/dL     Glucose Whole Blood - POCT [[id number]]  (Abnormal) Collected: 07/24/19 0429     Updated: 07/24/19 0443     Whole Blood Glucose POCT 110 mg/dL     Glucose Whole Blood - POCT  [[id number]]  (Abnormal) Collected: 07/23/19 2314     Updated: 07/23/19 2356     Whole Blood Glucose POCT 124 mg/dL     Glucose Whole Blood - POCT [[id number]]  (Abnormal) Collected: 07/23/19 1945     Updated: 07/23/19 1951     Whole Blood Glucose POCT 165 mg/dL     Glucose Whole Blood - POCT [[id number]]  (Abnormal) Collected: 07/23/19 1609     Updated: 07/23/19 1614     Whole Blood Glucose POCT 112 mg/dL     Glucose Whole Blood - POCT [[id number]]  (Abnormal) Collected: 07/23/19 1152     Updated: 07/23/19 1207     Whole Blood Glucose POCT 119 mg/dL     Anaerobic culture [[id number]] Collected: 07/19/19 1800    Specimen: Other from Abscess Updated: 07/23/19 0807    Narrative:      ORDER#: [id number]                                    ORDERED BY: SCRIVEN, MATTHE  SOURCE: Abscess abdomen                              COLLECTED:  07/19/19 18:00  ANTIBIOTICS AT COLL.:                                RECEIVED :  07/20/19 00:46  Culture, Anaerobic Bacteria                PRELIM      07/23/19 08:07  07/23/19   No growth to date, final report to follow      Glucose Whole Blood - POCT [[id number]]  (Abnormal) Collected: 07/23/19 0750     Updated: 07/23/19 0753     Whole Blood Glucose POCT 113 mg/dL     Glucose Whole Blood - POCT [[id number]] Collected: 07/23/19 0356  Updated: 07/23/19 0400     Whole Blood Glucose POCT 96 mg/dL     Glucose Whole Blood - POCT [[id number]]  (Abnormal) Collected: 07/23/19 0007     Updated: 07/23/19 0034     Whole Blood Glucose POCT 118 mg/dL     Glucose Whole Blood - POCT [[id number]]  (Abnormal) Collected: 07/22/19 2030     Updated: 07/22/19 2033     Whole Blood Glucose POCT 126 mg/dL     Glucose Whole Blood - POCT [[id number]] Collected: 07/22/19 1558     Updated: 07/22/19 1604     Whole Blood Glucose POCT 93 mg/dL             Micro / Labs / Path pending:     Unresulted Labs     None            Discharge Medications:     Current Discharge Medication List      CONTINUE these medications which have NOT  CHANGED    Details   cyanocobalamin 1000 MCG tablet Take 1,000 mcg by mouth      dilTIAZem (CARDIZEM) 30 MG tablet Take 30 mg by mouth 2 (two) times daily      Empagliflozin-metFORMIN HCl (Synjardy) 05-998 MG Tab Take 1 tablet by mouth      famotidine (PEPCID) 40 MG tablet Take 40 mg by mouth      insulin detemir (Levemir FlexTouch) 100 UNIT/ML injection pen INJECT 50 UNITS EVERY MORNING AND 5 UNITS EACH EVENING      levothyroxine (SYNTHROID) 50 MCG tablet Take 50 mcg by mouth Once a day at 6:00am      losartan (COZAAR) 50 MG tablet Take 50 mg by mouth daily      omeprazole (PriLOSEC) 10 MG capsule Take 10 mg by mouth daily      simvastatin (ZOCOR) 20 MG tablet Take 20 mg by mouth nightly      vitamin D (CHOLECALCIFEROL) 25 MCG (1000 UT) tablet Take 2,000 Units by mouth daily               Discharge Instructions:           Discharge Diet: Low fiber diet    Wound 07/19/19 Surgical Incision Abdomen Other (Comment) gauze, staples, tape (Active)   Date First Assessed/Time First Assessed: 07/19/19 1829   Wound Type: Surgical Incision  Location: Abdomen  Wound Location Orientation: Other (Comment)  Wound Description (Comments): gauze, staples, tape      Assessments 07/19/2019  6:34 PM 07/25/2019  8:30 AM   Site Description Dressing covering site (UTA) Clean;Dry;Intact   Peri-wound Description  Clean;Dry;Intact   Closure Dressing covering site (UTA) Staples   Drainage Amount  None   Dressing Gauze;Cloth Tape Other (Comment)   Dressing Changed New    Dressing Status Clean;Dry;Intact Clean;Intact;Dry       No Linked orders to display          Disposition:            Patient Instructions:    Activity:  We recommend quiet activity initially.  You may walk and go up and down stairs.  Your level of discomfort will guide the amount of activity you can do.  You should avoid strenuous activity and heavy lifting greater than 20 lbs for 4 to 6 weeks.  Once you have stopped taking prescription medication and can walk without  difficulty, you may drive again.    Care for the incision: If, upon discharge, your wound is  well-sealed and without drainage, you may shower.  Leave the white steri-strips on the incision for at least one-week.  The steri-strips may get wet.  No soaking your abdomen for two weeks, including baths, pools, the ocean, or hot tubs.  Gently pat the incision dry after showering.  The wound does not need gauze dressing, unless you wish to apply one.    If there is some draining, or if the wound has been left open, you will need a gauze dressing over it as it heals.      Diet:  Initially your diet may be decreased; this is normal.  We encourage you to keep up with liquids.  Begin with a light diet and avoid hard to digest foods such as broccoli, cauliflower, fatty foods, and spicy foods, as well as foods with seeds, nuts and popcorn.    Medications:  Resume all home medications as per the medication reconciliation form.  You will be given a prescription for pain medication. Take this as directed for post-operative pain.  If you are experiencing only mild discomfort, you may find over-the-counter medications, such as Tylenol (acetaminophen) or Advil/Nuprin (Ibuprofen), may be all you need for comfort.  Take stool softeners such as colace, metamucil, or sennokot while taking pain medicine to avoid constipation.    What to look for:  Frequent bowel movements or diarrhea is common.  Generally this will improve over 2 to 3 weeks after surgery.  Call our office if you do not have a bowel movement in 48 hours despite use of stool softeners or if there is blood in your stool.  You may notice a slight drainage or redness around sutures or clips at the incision.  This is normal and not a cause for concern.  However, please call our office immediately if you develop any of the following:    -- Increased abdominal pain  -- Excessive drainage, redness or swelling at or around the incision  -- Fever over 101F  -- Persistent nausea or  vomiting  -- Difficulty with urination  -- Difficulty breathing, chest pain or calf pain   -- Constipation which does not improve over 72 hours despite intervention    Follow-up:  You will be seen in our office 7 to 10 days after your surgery.  Prior to surgery, you should have made an appointment for your  post-operative visit.  If for some reason that appointment was not scheduled, please call our office at [phone] as soon as your return home to schedule your appointment.    Difficulties:  Please call us if any problems or questions arise.  We can be reached any time, including evenings and weekends, by calling our office number [phone].      Signed by: Ruel Favors, PA      [facility]  [phone]

## 2019-07-25 NOTE — Progress Notes (Signed)
[facility]   HOSPITALIST  PROGRESS NOTE      Patient: [name]  Date: 07/25/2019   LOS: 6 Days  Admission Date: 07/19/2019   MRN: [id number]  Attending: Andi Hence, MD     ASSESSMENT/PLAN     [name] is a 69 y.o. female admitted with       1.  Jejunal diverticulitis with perforation   S/p exploratory laparotomy with small bowel resection. POD 6  --passing gas and had 2 BMs   --Advance diet per surgery discretion   --further management per surgery team  --possible discharge later today if tolerates low fiber diet    2. DM type 2   She takes levemir 32U in AM and 24U in PM, Empagliflozin-Metformin at home   --resume home DM meds on discharge  --continue ISS, AC   --FS have been okay, continue to monitor     3. Hypothyroidism   --continue synthroid     4. GERD   --PPI daily     5. HTN   --on diltiazem, Cozaar  --resume home meds on discharge     6. HLP   --continue statin      7. Sinus tachycardia. Resolved   -- monitor for now       Analgesia: oxycodone prn     Nutrition: low fiber      Safety Checklist  DVT prophylaxis: Chemical   Foley: Not present   IVs:  Peripheral IV   PT/OT: Not needed   Daily CBC & or Chem ordered: Yes, due to clinical and lab instability       Patient Lines/Drains/Airways Status    Active PICC Line / CVC Line / PIV Line / Drain / Airway / Intraosseous Line / Epidural Line / ART Line / Line / Wound / Pressure Ulcer / NG/OG Tube     Name:   Placement date:   Placement time:   Site:   Days:    Peripheral IV 07/19/19 20 G Right Antecubital   07/19/19    1440    Antecubital   less than 1    Peripheral IV 07/19/19 20 G Left Antecubital   07/19/19    1452    Antecubital   less than 1    External Urinary Catheter   07/19/19    1850    --   less than 1    Wound 07/19/19 Surgical Incision Abdomen Other (Comment) gauze, staples, tape   07/19/19    1829    Abdomen   less than 1    NG/OG Tube Nasogastric Left nostril   07/19/19    1907    Left nostril   less than 1                MD/RN  rounds: yes        Code Status: FULL CODE     DISPO: likely home later today if tolerates low fiber diet    Family Contact: daughter     Care Plan discussed with nursing, consultants, case Production designer, theatre/television/film.       SUBJECTIVE     Patient seen and examined. Feels fine, passing gas and had 2 BMs. No new complaints     MEDICATIONS     Current Facility-Administered Medications   Medication Dose Route Frequency    acetaminophen  650 mg Oral Q6H    dilTIAZem  30 mg Oral BID    heparin (porcine)  5,000 Units Subcutaneous [facility]  insulin lispro  1-5 Units Subcutaneous Q4H [facility]    levothyroxine  50 mcg Oral Daily at 0600    losartan  25 mg Oral Daily    pantoprazole  40 mg Intravenous Daily    simvastatin  20 mg Oral QHS       ROS     Remainder of 10 point ROS as above or otherwise negative    PHYSICAL EXAM     Vitals:    07/25/19 1208   BP: 136/69   Pulse: 70   Resp: 16   Temp: 98.1 F (36.7 C)   SpO2: 97%       Temperature: Temp  Min: 97.9 F (36.6 C)  Max: 98.4 F (36.9 C)  Pulse: Pulse  Min: 66  Max: 85  Respiratory: Resp  Min: 16  Max: 24  Non-Invasive BP: BP  Min: 125/74  Max: 138/77  Pulse Oximetry SpO2  Min: 95 %  Max: 97 %    Intake and Output Summary (Last 24 hours) at Date Time    Intake/Output Summary (Last 24 hours) at 07/25/2019 1233  Last data filed at 07/25/2019 1208  Gross per 24 hour   Intake 1750 ml   Output 2800 ml   Net -1050 ml       Constiutional: Patient speaks freely in full sentences. Daughter at bedside and translating      HEENT: PERRL, no scleral icterus or conjunctival pallor, no nasal discharge, MMM, oropharynx without erythema or exudate  Neck: trachea midline, supple, no cervical or supraclavicular lymphadenopathy or masses  Cardiovascular: tachycardic, normal S1 S2, no murmurs  Respiratory: CTAB, no crackles or wheezing   Gastrointestinal: soft, non distended, minimal appropriate tenderness, midline incision looks c/d/i   Musculoskeletal: ROM and motor strength grossly normal. No edema   Skin:  no rashes, jaundice or other lesions  Neurologic: EOMI, CN 2-12 grossly intact. no gross motor or sensory deficits  Psychiatric: affect and mood appropriate. The patient is alert, interactive, appropriate.    LABS     Recent Labs   Lab 07/25/19  0412 07/22/19  0453 07/21/19  0938   WBC 6.81 8.51 9.07   RBC 3.96 4.01 3.83*   Hgb 10.1* 10.6* 10.1*   Hematocrit 31.1* 32.8* 31.4*   MCV 78.5 81.8 82.0   Platelets 219 187 182       Recent Labs   Lab 07/25/19  0412 07/22/19  0453 07/21/19  0938 07/20/19  0427 07/19/19  1448   Sodium 142 138 139 138 139   Potassium 3.0* 3.6 4.0 4.6 4.0   Chloride 107 107 108 107 104   CO2 21* 15* 17* 21* 23   BUN 8 18 21* 16 19   Creatinine 0.8 0.8 0.9 0.9 1.1*   Glucose 146* 94 114* 171* 122*   Calcium 8.1* 8.2* 8.3* 8.4* 9.8       Recent Labs   Lab 07/19/19  1448   ALT 12   AST (SGOT) 15   Bilirubin, Total 0.3   Albumin 3.8   Alkaline Phosphatase 61       Recent Labs   Lab 07/19/19  1448   Troponin I <0.01             Microbiology Results     Procedure Component Value Units Date/Time    Anaerobic culture [[id number]] Collected: 07/19/19 1800    Specimen: Other from Abscess Updated: 07/20/19 0046    Blood Culture Aerobic/Anaerobic #1 [[id number]] Collected: 07/19/19 1529  Specimen: Arm from Blood, Venipuncture Updated: 07/19/19 1852    Narrative:      1 BLUE+1 PURPLE    Blood Culture Aerobic/Anaerobic #2 [[id number]] Collected: 07/19/19 1533    Specimen: Arm from Blood, Venipuncture Updated: 07/19/19 2309    Narrative:      1 BLUE+1 PURPLE    Wound culture & gram stain [[id number]] Collected: 07/19/19 1800    Specimen: Wound from Abrasion Updated: 07/20/19 0141    Narrative:      ORDER#: [id number]                                    ORDERED BY: SCRIVEN, MATTHE  SOURCE: Abrasion abdomen                             COLLECTED:  07/19/19 18:00  ANTIBIOTICS AT COLL.:                                RECEIVED :  07/20/19 00:46  Stain, Gram                                FINAL       07/20/19  01:41  07/20/19   No Squamous epithelial cells seen             Many WBCs             No organisms seen  Culture and Gram Stain, Aerobic, Wound     PENDING             RADIOLOGY     Radiological Procedure xray personally reviewed and concur with radiologist reports unless stated otherwise.    XR Abdomen Portable   Final Result      NG tube tip in stomach.                  Wilmon Pali, MD    07/21/2019 10:43 AM      CT Abd/ Pelvis without Contrast   Final Result    Swirling mesenteric vessels suggestive of an internal   hernia. There is jejunal thickening and adjacent mesenteric edema with   suspected contained perforation or abscess. Critical results discussed   with Dr. Lourdes Sledge at 3:25 PM.      Bosie Helper, MD    07/19/2019 3:32 PM          Signed,  Andi Hence, MD   12:33 PM 07/25/2019

## 2019-07-25 NOTE — Discharge Summary -  Nursing (Signed)
Patient discharged to home via wheelchair accompanied by nursing staff transport. Discharge instructions provided/reviewed/signed without questions or concerns with daughter, patient care companion and interpreter. IV discontinued with catheter tip intact. Patient reports pain under control upon discharge. Denies nausea and vomiting. Scripts sent to outpatient pharmacy.

## 2019-07-25 NOTE — Discharge Instructions (Signed)
Low-Fiber Diet     Eggs are high in protein and easy to digest.   Eating a low-fiber diet means eating foods that don’t have much fiber. These foods are easy to digest.   Most of the fiber that you eat passes undigested through your bowel. This is what forms stool. Low-fiber foods can help to slow down your bowel movements. When you eat a low-fiber diet, you have fewer stools. This lets your intestine rest.   Your healthcare provider will tell you how long you need to be on this diet. It may only be for a short time. Low-fiber foods often don’t give you all the nutrients you need to stay healthy. Your healthcare provider may have you take certain vitamins while you are on this diet.   Reasons to eat a low-fiber diet  The goal of a low-fiber diet is to limit the size and number of your stools. It may be prescribed if you:   · Are going through chemotherapy or radiation treatments  · Have had intestinal surgery  · Have trouble digesting food  · Have a condition that affects your intestine, such as diarrhea, irritable bowel syndrome, Crohn’s disease, ulcerative colitis, or diverticulitis  General guidelines for a low-fiber diet  In general, a low-fiber diet means having fewer than 13 grams of fiber a day. Your healthcare provider may give you a list of things you can and can’t eat or drink. Read food labels. Choose foods and drinks that have as close to zero grams of fiber as possible. Here are general guidelines to follow:   Breads, pasta, cereal, rice, and other starches (6 to 11 servings daily)  · What to choose: white bread, biscuits, muffins, and white rolls; plain crackers; waffles; white pasta; white rice; cream of wheat; grits; white pancakes; corn flakes; cooked potatoes without skin; pretzels. Fiber content of these foods should be less than 0.5 (1/2) gram per serving.  · What to pass up: whole-wheat or whole-grain breads, crackers, and pasta; breads with seeds or nuts; wheat germ; graham  crackers; cornbread; wild or brown rice; cereals with whole-grain, bran, and granola; cereals with seeds, nuts, coconut, or dried fruit; potatoes with skin  Milk and dairy (2 servings daily)  · What to choose: milk, buttermilk; yogurt or ice cream without seeds or nuts; custard or pudding; sour cream; cheese and cottage cheese; cream sauces, soups, and casseroles  · What to pass up: ice cream and yogurt with seeds, nuts, or fruit chunks  Fruit (2 to 4 servings daily)  · What to choose: ripe banana; ripe nectarine, peach, apricot, papaya, and plum; soft honeydew melon and cantaloupe; cooked or canned fruit without skin or seeds (not sweetened with sorbitol); applesauce; strained fruit juice (without pulp)  · What to pass up: raw or dried fruit; all berries; raisins; canned and raw pineapple; prunes and prune juice; fruit juice with pulp  Vegetables (3 to 5 servings daily)  · What to choose: well-cooked or canned vegetables without seeds, such as spinach, eggplant, green and wax beans, carrots, yellow squash, pumpkin; lettuce on a sandwich  · What to pass up: all raw or steamed vegetables; vegetables with seeds, such as unstrained tomato sauce; green peas; lima beans; broccoli; corn; parsnips  Meats and protein (4 to 6 ounces daily)  · What to choose: tender, well-cooked meat, including ground meat, poultry, and fish; eggs; tofu; creamy peanut butter  · What to pass up: processed meats such as hot dogs and sausages, tough, chewy meat with gristle; peas, including   split, yellow, and black-eyed; beans, including navy, lima, black, garbanzo, soy, pinto, and lentil; peanuts and crunchy peanut butter   Fats, oils, sauces, condiments (fewer than 8 teaspoons daily)  · What to choose: butter, margarine, oils, whipped cream, sour cream, mayonnaise, smooth dressings and sauces; plain gravy; smooth condiments  · What to pass up: dressing with seeds or fruit chunks; pickles and relishes  Other foods and drinks  · What to choose:  water; plain gelatin; plain puddings; pretzels; plain cookies and cakes; honey, syrup; decaffeinated drinks, including tea and coffee    · What to pass up: popcorn; potato chips; spicy foods; fried, greasy foods; alcohol (ask your healthcare provider); marmalade, jam, and preserves; desserts that have seeds, nuts, coconut, dried fruit, whole grains, or bran; candy that has seeds or nuts; drinks sweetened with sorbitol or other sugar substitutes; caffeinated drinks, including tea, coffee, soda, and energy drinks    [employer] last reviewed this educational content on 05/24/2018  © 2000-2021 [employer]. All rights reserved. This information is not intended as a substitute for professional medical care. Always follow your healthcare professional's instructions.

## 2019-07-25 NOTE — Plan of Care (Signed)
Problem: Safety  Goal: Patient will be free from injury during hospitalization  Outcome: Adequate for Discharge  Goal: Patient will be free from infection during hospitalization  Outcome: Adequate for Discharge     Problem: Pain  Goal: Pain at adequate level as identified by patient  Outcome: Adequate for Discharge     Problem: Side Effects from Pain Analgesia  Goal: Patient will experience minimal side effects of analgesic therapy  Outcome: Adequate for Discharge     Problem: Discharge Barriers  Goal: Patient will be discharged home or other facility with appropriate resources  Outcome: Adequate for Discharge     Problem: Psychosocial and Spiritual Needs  Goal: Demonstrates ability to cope with hospitalization/illness  Outcome: Adequate for Discharge     Problem: Moderate/High Fall Risk Score >5  Goal: Patient will remain free of falls  Outcome: Adequate for Discharge     Problem: Altered GI Function  Goal: Fluid and electrolyte balance are achieved/maintained  Outcome: Adequate for Discharge  Goal: Elimination patterns are normal or improving  Outcome: Adequate for Discharge  Goal: Nutritional intake is adequate  Outcome: Adequate for Discharge  Goal: Mobility/Activity is maintained at optimal level for patient  Outcome: Adequate for Discharge  Goal: No bleeding  Outcome: Adequate for Discharge     NURSE NOTE SUMMARY  [facility] - 5NEW ORTHOPEDICS   Patient Name: [name]   Attending Physician: Talbert Nan, MD   Today's date:   07/25/2019 LOS: 6 days   Shift Summary:                                                                Patient alert/oriented X4. Denies nausea and vomiting. Tolerating low fiber diet. VSS. Abdominal staples clean, dry, and intact. Reports pain under control with scheduled Tylenol. Use of IS, deep breathing, and ambulation encouraged. Patient walked in room without difficulty. Daughter at bedside.     Pt will be free of injury during hospital visit.  Pt belongings,  phone, call bell within reach.  Pt instructed to call for assistance when needed, demonstrates understanding.  Pt bed in the lowest position, bed alarm on.  Purposeful rounding hourly.  Will continue to monitor.      Provider Notifications:     N/A      Rapid Response Notifications:  Mobility:     N/A PMP Activity: Step 6 - Walks in Room (07/25/2019  7:38 AM)     Weight tracking:  Family Dynamic:   No data found.          Last Bowel Movement   Last BM Date: 07/24/19

## 2019-07-25 NOTE — Progress Notes (Signed)
POST OPERATIVE PROGRESS NOTE  [facility] [phone]    Date Time: 07/25/19 8:46 AM  Patient Name: [name],[name]      ASSESSMENT:   6 Days Post-Op S/P Procedure(s):  EXPLORATORY LAPAROTOMY, SMALL BOWEL RESECTION X 1   - jejunal diverticulitis with perforation noted intra-op    Abdomen soft with minimal gaseous distention. Tolerating full liquid diet nicely. Denies any nausea/vomiting. Passing multiple BMs yesterday & flatus. Ambulating, AVSS. Labs reviewed, Hypokalemia noted.   PLAN:   - trial low fiber diet  - saline lock IV  - K repletion  - supportive care in place  - hospitalist assistance appreciated with comorbid management.  - SQ heparin, bilateral SCDs  - anticipate [place] home later today if tolerating low fiber diet.     * Patient seen with Dr. Darnelle Bos    SUBJECTIVE:   The patient is doing well.  Post operative pain is well controlled with medications.  Current dietary status:  Supervise For Meals Frequency: All meals  Diet full liquid and tolerating well.  Flatus: yes. BM:  yes.  Additional complaints: none       OBJECTIVE:   Current Vitals:   Vitals:    07/25/19 0831   BP: 138/77   Pulse: 81   Resp: (!) 24   Temp: 98.4 F (36.9 C)   SpO2: 97%       Intake and Output Summary (Last 24 hours):  I/O last 3 completed shifts:  In: 3026.75 [P.O.:1550; I.V.:1476.75]  Out: 3650 [Urine:3650]    Labs:     Results     Procedure Component Value Units Date/Time    Basic Metabolic Panel [[id number]]  (Abnormal) Collected: 07/25/19 0412    Specimen: Blood Updated: 07/25/19 0540     Glucose 146 mg/dL      BUN 8 mg/dL      Creatinine 0.8 mg/dL      Calcium 8.1 mg/dL      Sodium 409 mEq/L      Potassium 3.0 mEq/L      Chloride 107 mEq/L      CO2 21 mEq/L      Anion Gap 14.0    GFR [[id number]] Collected: 07/25/19 0412     Updated: 07/25/19 0540     EGFR >60.0    CBC and differential [[id number]]  (Abnormal) Collected: 07/25/19 0412    Specimen: Blood Updated: 07/25/19 0502     WBC 6.81 x10 3/uL      Hgb 10.1 g/dL      Hematocrit  57.8 %      Platelets 219 x10 3/uL      RBC 3.96 x10 6/uL      MCV 78.5 fL      MCH 25.5 pg      MCHC 32.5 g/dL      RDW 14 %      MPV 9.5 fL      Neutrophils 56.7 %      Lymphocytes Automated 26.3 %      Monocytes 9.1 %      Eosinophils Automated 5.4 %      Basophils Automated 0.6 %      Immature Granulocytes 1.9 %      Nucleated RBC 0.0 /100 WBC      Neutrophils Absolute 3.86 x10 3/uL      Lymphocytes Absolute Automated 1.79 x10 3/uL      Monocytes Absolute Automated 0.62 x10 3/uL      Eosinophils Absolute Automated 0.37 x10 3/uL  Basophils Absolute Automated 0.04 x10 3/uL      Immature Granulocytes Absolute 0.13 x10 3/uL      Absolute NRBC 0.00 x10 3/uL     Glucose Whole Blood - POCT [[id number]]  (Abnormal) Collected: 07/25/19 0437     Updated: 07/25/19 0455     Whole Blood Glucose POCT 141 mg/dL     Blood Culture Aerobic/Anaerobic #2 [[id number]] Collected: 07/19/19 1533    Specimen: Arm from Blood, Venipuncture Updated: 07/25/19 0121    Narrative:      ORDER#: [id number]                                    ORDERED BY: RHEE, JAMES  SOURCE: Blood, Venipuncture Arm                      COLLECTED:  07/19/19 15:33  ANTIBIOTICS AT COLL.:                                RECEIVED :  07/19/19 23:09  Culture Blood Aerobic and Anaerobic        FINAL       07/25/19 01:21  07/25/19   No growth after 5 days of incubation.      Glucose Whole Blood - POCT [[id number]]  (Abnormal) Collected: 07/25/19 0039     Updated: 07/25/19 0045     Whole Blood Glucose POCT 179 mg/dL     Blood Culture Aerobic/Anaerobic #1 [[id number]] Collected: 07/19/19 1529    Specimen: Arm from Blood, Venipuncture Updated: 07/24/19 2121    Narrative:      ORDER#: [id number]                                    ORDERED BY: RHEE, JAMES  SOURCE: Blood, Venipuncture Arm                      COLLECTED:  07/19/19 15:29  ANTIBIOTICS AT COLL.:                                RECEIVED :  07/19/19 18:52  Culture Blood Aerobic and Anaerobic        FINAL       07/24/19  21:21  07/24/19   No growth after 5 days of incubation.      Glucose Whole Blood - POCT [[id number]]  (Abnormal) Collected: 07/24/19 2000     Updated: 07/24/19 2003     Whole Blood Glucose POCT 169 mg/dL     Glucose Whole Blood - POCT [[id number]]  (Abnormal) Collected: 07/24/19 1613     Updated: 07/24/19 1619     Whole Blood Glucose POCT 269 mg/dL           Rads:     Radiology Results (24 Hour)     ** No results found for the last 24 hours. **          Physical Exam:     Mental status - alert, oriented to person, place, and time  Chest - no tachypnea, retractions or cyanosis, breathing comfortably on room air, audibly clear respirations  Heart - normal rate and regular rhythm  Abdomen - soft, minimal gaseous distention, non-tender,  no rebound tenderness or guarding  Wound - clean, dry, no drainage  Extremities - no pedal edema noted, warm, well perfused      Signed by: Ruel Favors

## 2019-07-25 NOTE — Discharge Instr - AVS First Page (Signed)
Admit Date: 07/19/2019   Discharge Date:  07/25/2019     Attending: Talbert Nan, MD   Surgeon: Surgeon(s):  Scriven, Lupita Raider, MD      Procedure(s) (LRB):  EXPLORATORY LAPAROTOMY, SMALL BOWEL RESECTION X 1 (N/A)    DISCHARGE INSTRUCTIONSINTESTINAL SURGERY    Patient Instructions after your intestinal surgery.  Please refer to the complete postop instructions for details.    WHAT TO EXPECT AT YOUR SURGICAL SITE:    Pink/reddish drainage, bruising, swelling/lump at incision(s) may occur and is normal.    CARE FOR THE INCISION:    Skin glue over your incision(s) will dissolve over the next two weeks.  If you have white tapes on the skin (steri-strips), these will fall off over 7-10 days  If you still have staples at the time of discharge, they will be removed at your follow up postoperative appointment.  Shower 24 hours after your surgery.  No tub bathing, or pool/ocean for 1 week.  Use an ice pack every 15-20 min intermittently for the first 48 hrs as needed.    DIET:    Keep up with your liquids.  If your appetite has returned, eat what your system can tolerate.   Avoid hard to digest material, such as seeds, nuts, raw vegetables or fruits with pulp, or other high-roughage foods, until your follow up visit    ACTIVITY:    If it hurts, don't do it!  Walking & stairs are encouraged.    Avoid strenuous physical activity & lifting until cleared by your surgeon.  You may drive again once you have stopped taking prescription medication for pain.      MEDICATION:    Resume all home medications.  Use over-the-counter pain medications or a prescribed narcotic for your discomfort as needed.    For constipation, an over-the-counter stool softener or laxative may be taken.    WHAT TO LOOK FOR:    Please call our office immediately at [phone] or  go to the ER if you develop any of the following:    Excessive drainage, bleeding, redness or swelling at or around the incision  Fever over 101F  Increased abdominal  pain  Persistent nausea or vomiting  Difficulty with urination  Difficulty breathing, chest pain or calf pain    FOLLOW-UP:    We will see you in our office 1-2 weeks after your surgery.  If not already scheduled, please call [phone] to arrange your post op visit.

## 2019-07-29 ENCOUNTER — Telehealth (INDEPENDENT_AMBULATORY_CARE_PROVIDER_SITE_OTHER): Payer: Medicare Other | Admitting: Internal Medicine

## 2019-07-29 DIAGNOSIS — E1142 Type 2 diabetes mellitus with diabetic polyneuropathy: Secondary | ICD-10-CM | POA: Diagnosis not present

## 2019-07-29 DIAGNOSIS — Z794 Long term (current) use of insulin: Secondary | ICD-10-CM

## 2019-07-29 NOTE — Progress Notes (Signed)
   Subjective:  Patient ID: Barbara White, female    DOB: [birth year]  Age: 69 y.o. MRN: [id number]  Virtual Visit via Video Note  I connected with Barbara White on 07/29/19 at  3:20 PM EDT by a video enabled telemedicine application and verified that I am speaking with the correct person using two identifiers.   I discussed the limitations of evaluation and management by telemedicine and the availability of in person appointments. The patient expressed understanding and agreed to proceed.  I was located at our [facility] office. The patient was at home in [place] near [place], [place]. There was her son present in the visit.   History of Present Illness: C/o recent hosp stay for abd pain  S/p labaroscopic exploration for a ruptured intestine as I understand. She is in [place] area w/her son C/o CBGs low at night - 68. Eating 3/day. No n/v  There has been no runny nose, cough, chest pain, shortness of breath, abdominal pain, diarrhea, constipation, arthralgias, skin rashes.   Observations/Objective: The patient appears to be in no acute distress, looks well.  Assessment and Plan:  See my Assessment and Plan. Follow Up Instructions:    I discussed the assessment and treatment plan with the patient. The patient was provided an opportunity to ask questions and all were answered. The patient agreed with the plan and demonstrated an understanding of the instructions.   The patient was advised to call back or seek an in-person evaluation if the symptoms worsen or if the condition fails to improve as anticipated.  I provided face-to-face time during this encounter. We were at different locations.   Sonda Primes, MD

## 2019-07-30 ENCOUNTER — Encounter: Payer: Self-pay | Admitting: Internal Medicine

## 2019-07-30 NOTE — Assessment & Plan Note (Signed)
Hypoglycemia - mild Diet is improving Reduce insulin if low CBGs

## 2019-08-05 ENCOUNTER — Ambulatory Visit (INDEPENDENT_AMBULATORY_CARE_PROVIDER_SITE_OTHER): Payer: Medicare Other | Admitting: Surgery

## 2019-08-05 ENCOUNTER — Encounter: Payer: Self-pay | Admitting: Surgery

## 2019-08-05 VITALS — Ht 62.0 in | Wt 136.3 lb

## 2019-08-05 DIAGNOSIS — Z9889 Other specified postprocedural states: Secondary | ICD-10-CM

## 2019-08-05 NOTE — Progress Notes (Signed)
Visit Date Time: 08/05/2019  2:34 PM     Referring Provider:    [facility] Provider: Talbert Nan, MD    POSTOP NOTE:    Reason for visit:  Here for a postop visit    [name] is a 69 y.o. female who  has a past medical history of Atrial fibrillation, Diabetes mellitus, Disorder of thyroid, Hypertension, and Hypothyroid..  She  presents after recent surgery.  She had EXPLORATORY LAPAROTOMY, SMALL BOWEL RESECTION X 1 by Dr. Eustace Quail on 07/19/2019 at [facility].     Pathology: Not sent    She reports uneventful postop recovery and denies uneventful postop recovery.    The patient has not been to the ED since surgery/last visit.  The patient did not require readmission since surgery/last visit.    Additional issues: None.    Past Medical History:     Past Medical History:   Diagnosis Date    Atrial fibrillation     Diabetes mellitus     Disorder of thyroid     Hypertension     Hypothyroid        Past Surgical History:     Past Surgical History:   Procedure Laterality Date    APPENDECTOMY      BREAST LUMPECTOMY Left 2018    CHOLECYSTECTOMY      EXPLORATORY LAPAROTOMY N/A 07/19/2019    Procedure: EXPLORATORY LAPAROTOMY, SMALL BOWEL RESECTION X 1;  Surgeon: Talbert Nan, MD;  Location: [place] MAIN OR;  Service: General;  Laterality: N/A;    HYSTERECTOMY         Family History:     Family History   Problem Relation Age of Onset    Breast cancer Neg Hx        Social History:     Social History     Socioeconomic History    Marital status: Married     Spouse name: Not on file    Number of children: Not on file    Years of education: Not on file    Highest education level: Not on file   Occupational History    Not on file   Tobacco Use    Smoking status: Never Smoker    Smokeless tobacco: Never Used   Vaping Use    Vaping Use: Never used   Substance and Sexual Activity    Alcohol use: Never    Drug use: Never    Sexual activity: Not on file   Other Topics Concern    Not on file   Social  History Narrative    Not on file     Social Determinants of Health     Financial Resource Strain:     Difficulty of Paying Living Expenses:    Food Insecurity:     Worried About Programme researcher, broadcasting/film/video in the Last Year:     Barista in the Last Year:    Transportation Needs:     Freight forwarder (Medical):     Lack of Transportation (Non-Medical):    Physical Activity:     Days of Exercise per Week:     Minutes of Exercise per Session:    Stress:     Feeling of Stress :    Social Connections:     Frequency of Communication with Friends and Family:     Frequency of Social Gatherings with Friends and Family:     Attends Religious Services:     Active  Member of Clubs or Organizations:     Attends Engineer, structural:     Marital Status:    Intimate Partner Violence:     Fear of Current or Ex-Partner:     Emotionally Abused:     Physically Abused:     Sexually Abused:        Allergies:   Penicillins and Sulfa antibiotics    Medications:     Current Outpatient Medications   Medication Sig Dispense Refill    acetaminophen (TYLENOL) 325 MG tablet Take 2 tablets (650 mg total) by mouth every 6 (six) hours      cyanocobalamin 1000 MCG tablet Take 1,000 mcg by mouth      dilTIAZem (CARDIZEM) 30 MG tablet Take 30 mg by mouth 2 (two) times daily      Empagliflozin-metFORMIN HCl (Synjardy) 05-998 MG Tab Take 1 tablet by mouth      famotidine (PEPCID) 40 MG tablet Take 40 mg by mouth      insulin detemir (Levemir FlexTouch) 100 UNIT/ML injection pen INJECT 50 UNITS EVERY MORNING AND 5 UNITS EACH EVENING      levothyroxine (SYNTHROID) 50 MCG tablet Take 50 mcg by mouth Once a day at 6:00am      losartan (COZAAR) 50 MG tablet Take 50 mg by mouth daily      omeprazole (PriLOSEC) 10 MG capsule Take 10 mg by mouth daily      simvastatin (ZOCOR) 20 MG tablet Take 20 mg by mouth nightly      vitamin D (CHOLECALCIFEROL) 25 MCG (1000 UT) tablet Take 2,000 Units by mouth daily       No  current facility-administered medications for this visit.       Review of Systems:     ROS   As per the HPI and above. The patient otherwise denies any additional changes to their otic, opthalmologic, dermatologic, pulmonary, cardiac, gastrointestinal, genitourinary, musculoskeletal, hematologic, constitutional, or psychiatric systems.      Physical Exam:   There were no vitals filed for this visit.    Appears comfortable  No labored breathing  The wound(s) demonstrate good healing and appearance. and Staples removed today.    Assessment:     Patient Active Problem List   Diagnosis    Internal hernia     There are no diagnoses linked to this encounter.     Plan:     Postop instructions: wound care instructions given (Outer dressing may be removed tomorrow.  Allow steri-strip(s) to fall off.  Keep wound dry until tomorrow, and do not submerge for 3 days.  Avoid strenuous activity with affected area for 3 days.)    Follow up: as needed    Thank you for sending this patient to [facility] for surgical treatment.  We appreciate the opportunity to participate in their care.  Their surgical issue has been addressed, and they will be returned to your care and/or their PCP for any ongoing issues.  If you have any questions, do not hesitate to contact us at [phone].    Signed by: Talbert Nan, MD

## 2019-10-13 ENCOUNTER — Telehealth: Payer: Self-pay | Admitting: Internal Medicine

## 2019-10-13 DIAGNOSIS — E785 Hyperlipidemia, unspecified: Secondary | ICD-10-CM

## 2019-10-13 NOTE — Telephone Encounter (Signed)
° ° °  [facility] calling to request Simvastatin be lowered to 10mg , due to possible interaction with diltiazem Phone [phone]

## 2019-10-15 MED ORDER — SIMVASTATIN 20 MG PO TABS: ORAL_TABLET | ORAL | 0 refills | Status: DC

## 2019-10-15 NOTE — Telephone Encounter (Signed)
Okay.  Thanks.

## 2019-10-17 MED ORDER — SIMVASTATIN 10 MG PO TABS: 10.0000 mg | ORAL_TABLET | Freq: Every day | ORAL | 1 refills | Status: DC

## 2019-10-17 NOTE — Telephone Encounter (Signed)
Rx sent to pharmacy   

## 2019-10-17 NOTE — Telephone Encounter (Signed)
Follow up message  Karin Golden calling back to clarify if the order for Simvastatin will be changed

## 2019-11-09 DIAGNOSIS — Z23 Encounter for immunization: Secondary | ICD-10-CM | POA: Diagnosis not present

## 2019-11-13 ENCOUNTER — Ambulatory Visit (INDEPENDENT_AMBULATORY_CARE_PROVIDER_SITE_OTHER): Payer: Medicare Other | Admitting: Internal Medicine

## 2019-11-13 ENCOUNTER — Other Ambulatory Visit: Payer: Self-pay

## 2019-11-13 ENCOUNTER — Encounter: Payer: Self-pay | Admitting: Internal Medicine

## 2019-11-13 DIAGNOSIS — E785 Hyperlipidemia, unspecified: Secondary | ICD-10-CM

## 2019-11-13 DIAGNOSIS — N63 Unspecified lump in unspecified breast: Secondary | ICD-10-CM | POA: Diagnosis not present

## 2019-11-13 DIAGNOSIS — E538 Deficiency of other specified B group vitamins: Secondary | ICD-10-CM

## 2019-11-13 DIAGNOSIS — Z794 Long term (current) use of insulin: Secondary | ICD-10-CM

## 2019-11-13 DIAGNOSIS — K5730 Diverticulosis of large intestine without perforation or abscess without bleeding: Secondary | ICD-10-CM | POA: Diagnosis not present

## 2019-11-13 DIAGNOSIS — E039 Hypothyroidism, unspecified: Secondary | ICD-10-CM

## 2019-11-13 DIAGNOSIS — I10 Essential (primary) hypertension: Secondary | ICD-10-CM

## 2019-11-13 DIAGNOSIS — F419 Anxiety disorder, unspecified: Secondary | ICD-10-CM | POA: Diagnosis not present

## 2019-11-13 DIAGNOSIS — E1142 Type 2 diabetes mellitus with diabetic polyneuropathy: Secondary | ICD-10-CM

## 2019-11-13 MED ORDER — LEVEMIR FLEXTOUCH 100 UNIT/ML ~~LOC~~ SOPN: PEN_INJECTOR | SUBCUTANEOUS | 11 refills | Status: DC

## 2019-11-13 MED ORDER — RELION LANCET DEVICES 30G MISC
1 refills | Status: DC
Start: 2019-11-13 — End: 2020-08-11

## 2019-11-13 MED ORDER — VITAMIN D3 50 MCG (2000 UT) PO CAPS: 2000.0000 [IU] | ORAL_CAPSULE | Freq: Every day | ORAL | 3 refills | Status: AC

## 2019-11-13 MED ORDER — LORAZEPAM 0.5 MG PO TABS: 0.5000 mg | ORAL_TABLET | Freq: Two times a day (BID) | ORAL | 2 refills | Status: AC | PRN

## 2019-11-13 MED ORDER — SYNJARDY 5-1000 MG PO TABS
1.0000 | ORAL_TABLET | Freq: Two times a day (BID) | ORAL | 1 refills | Status: DC
Start: 2019-11-13 — End: 2020-01-28

## 2019-11-13 MED ORDER — RELION BLOOD GLUCOSE TEST VI STRP: ORAL_STRIP | 3 refills | Status: DC

## 2019-11-13 MED ORDER — FAMOTIDINE 40 MG PO TABS
40.0000 mg | ORAL_TABLET | Freq: Every day | ORAL | 1 refills | Status: DC
Start: 2019-11-13 — End: 2020-09-16

## 2019-11-13 MED ORDER — VITAMIN B-12 1000 MCG PO TABS: 1000.0000 ug | ORAL_TABLET | Freq: Every day | ORAL | 3 refills | Status: AC

## 2019-11-13 MED ORDER — RELION LANCETS STANDARD 21G MISC: 3 refills | Status: DC

## 2019-11-13 MED ORDER — LEVOTHYROXINE SODIUM 50 MCG PO TABS: 50.0000 ug | ORAL_TABLET | Freq: Every day | ORAL | 1 refills | Status: DC

## 2019-11-13 MED ORDER — SIMVASTATIN 10 MG PO TABS
10.0000 mg | ORAL_TABLET | Freq: Every day | ORAL | 1 refills | Status: DC
Start: 2019-11-13 — End: 2020-07-12

## 2019-11-13 MED ORDER — LOSARTAN POTASSIUM 50 MG PO TABS
50.0000 mg | ORAL_TABLET | Freq: Every day | ORAL | 1 refills | Status: DC
Start: 2019-11-13 — End: 2020-01-28

## 2019-11-13 MED ORDER — DILTIAZEM HCL 30 MG PO TABS: 30.0000 mg | ORAL_TABLET | Freq: Two times a day (BID) | ORAL | 1 refills | Status: DC

## 2019-11-13 MED ORDER — INSULIN PEN NEEDLE 31G X 5 MM MISC: 1.0000 [IU] | Freq: Four times a day (QID) | 3 refills | Status: DC

## 2019-11-13 MED ORDER — RELION LANCET DEVICES 30G MISC: 1 refills | Status: DC

## 2019-11-13 NOTE — Assessment & Plan Note (Signed)
Lorazepam prn 

## 2019-11-13 NOTE — Assessment & Plan Note (Signed)
On Simvastatin OK wlow dose Diltiazem

## 2019-11-13 NOTE — Patient Instructions (Signed)
Colonoscopy vs cologuard

## 2019-11-13 NOTE — Assessment & Plan Note (Signed)
Colectomy for perforated diderticulitis summer 2021 Colonoscopy vs cologuard

## 2019-11-13 NOTE — Assessment & Plan Note (Signed)
Labs A1c 7% today

## 2019-11-13 NOTE — Assessment & Plan Note (Signed)
Labs

## 2019-11-13 NOTE — Progress Notes (Signed)
Subjective:  Patient ID: Barbara White, female    DOB: [birth year]  Age: 69 y.o. MRN: [id number]  CC: Follow-up   HPI Carly Sabo presents for DM2, HTN and hypothyroidism f/u  A1c 7.0%  Outpatient Medications Prior to Visit  Medication Sig Dispense Refill  . aspirin 81 MG chewable tablet Chew 1 tablet (81 mg total) by mouth daily. 90 tablet 3  . chlorhexidine (PERIDEX) 0.12 % solution     . Cholecalciferol (VITAMIN D3) 2000 units capsule Take 1 capsule (2,000 Units total) daily by mouth. 100 capsule 3  . diltiazem (CARDIZEM) 30 MG tablet Take 1 tablet (30 mg total) by mouth 2 (two) times daily. 360 tablet 1  . Empagliflozin-metFORMIN HCl (SYNJARDY) 05-998 MG TABS Take 1 tablet by mouth 2 (two) times daily. 360 tablet 1  . famotidine (PEPCID) 40 MG tablet Take 1 tablet (40 mg total) by mouth daily. 180 tablet 1  . LEVEMIR FLEXTOUCH 100 UNIT/ML FlexPen INJECT 50 UNITS EVERY MORNING AND 5 UNITS EACH EVENING 15 mL 11  . levothyroxine (SYNTHROID) 50 MCG tablet Take 1 tablet (50 mcg total) by mouth daily. 180 tablet 1  . losartan (COZAAR) 50 MG tablet Take 1 tablet (50 mg total) by mouth daily. 180 tablet 1  . Multiple Vitamins-Minerals (ICAPS AREDS 2 PO) Take 1 capsule by mouth 2 (two) times daily.    . simvastatin (ZOCOR) 10 MG tablet Take 1 tablet (10 mg total) by mouth at bedtime. 90 tablet 1  . vitamin B-12 (CYANOCOBALAMIN) 1000 MCG tablet Take 1 tablet (1,000 mcg total) by mouth daily. 100 tablet 3   No facility-administered medications prior to visit.    ROS: Review of Systems  Constitutional: Negative for activity change, appetite change, chills, fatigue and unexpected weight change.  HENT: Negative for congestion, mouth sores and sinus pressure.   Eyes: Negative for visual disturbance.  Respiratory: Negative for cough and chest tightness.   Gastrointestinal: Negative for abdominal pain and nausea.  Genitourinary: Negative for difficulty urinating, frequency and vaginal pain.   Musculoskeletal: Negative for back pain and gait problem.  Skin: Negative for pallor and rash.  Neurological: Negative for dizziness, tremors, weakness, numbness and headaches.  Psychiatric/Behavioral: Negative for confusion, sleep disturbance and suicidal ideas.    Objective:  BP 118/68 (BP Location: Left Arm, Patient Position: Sitting, Cuff Size: Large)   Pulse 67   Temp 97.9 F (36.6 C) (Oral)   Ht 5\' 2"  (1.575 m)   Wt 142 lb (64.4 kg)   SpO2 96%   BMI 25.97 kg/m   BP Readings from Last 3 Encounters:  11/13/19 118/68  04/29/19 115/70  11/04/18 100/68    Wt Readings from Last 3 Encounters:  11/13/19 142 lb (64.4 kg)  04/29/19 136 lb (61.7 kg)  11/04/18 139 lb 9.6 oz (63.3 kg)    Physical Exam Constitutional:      General: She is not in acute distress.    Appearance: She is well-developed. She is obese.  HENT:     Head: Normocephalic.     Right Ear: External ear normal.     Left Ear: External ear normal.     Nose: Nose normal.  Eyes:     General:        Right eye: No discharge.        Left eye: No discharge.     Conjunctiva/sclera: Conjunctivae normal.     Pupils: Pupils are equal, round, and reactive to light.  Neck:     Thyroid:  No thyromegaly.     Vascular: No JVD.     Trachea: No tracheal deviation.  Cardiovascular:     Rate and Rhythm: Normal rate and regular rhythm.     Heart sounds: Normal heart sounds.  Pulmonary:     Effort: No respiratory distress.     Breath sounds: No stridor. No wheezing.  Abdominal:     General: Bowel sounds are normal. There is no distension.     Palpations: Abdomen is soft. There is no mass.     Tenderness: There is no abdominal tenderness. There is no guarding or rebound.  Musculoskeletal:        General: No tenderness.     Cervical back: Normal range of motion and neck supple.  Lymphadenopathy:     Cervical: No cervical adenopathy.  Skin:    Findings: No erythema or rash.  Neurological:     Cranial Nerves: No  cranial nerve deficit.     Motor: No abnormal muscle tone.     Coordination: Coordination normal.     Deep Tendon Reflexes: Reflexes normal.  Psychiatric:        Behavior: Behavior normal.        Thought Content: Thought content normal.        Judgment: Judgment normal.     Lab Results  Component Value Date   WBC 6.9 04/29/2019   HGB 12.2 04/29/2019   HCT 37.1 04/29/2019   PLT 269.0 04/29/2019   GLUCOSE 130 (H) 04/29/2019   CHOL 153 04/29/2019   TRIG 349.0 (H) 04/29/2019   HDL 29.60 (L) 04/29/2019   LDLDIRECT 86.0 04/29/2019   LDLCALC 81 04/10/2017   ALT 10 04/29/2019   AST 12 04/29/2019   NA 136 04/29/2019   K 4.0 04/29/2019   CL 102 04/29/2019   CREATININE 1.17 04/29/2019   BUN 15 04/29/2019   CO2 28 04/29/2019   TSH 3.20 04/29/2019   HGBA1C 7.8 (H) 04/29/2019   MICROALBUR <0.7 04/10/2017    No results found.  Assessment & Plan:   There are no diagnoses linked to this encounter.   No orders of the defined types were placed in this encounter.    Follow-up: No follow-ups on file.  Sonda Primes, MD

## 2019-11-13 NOTE — Assessment & Plan Note (Signed)
On B12 

## 2019-11-13 NOTE — Assessment & Plan Note (Signed)
Mammogram q 12 year

## 2019-11-21 ENCOUNTER — Other Ambulatory Visit (INDEPENDENT_AMBULATORY_CARE_PROVIDER_SITE_OTHER): Payer: Medicare Other

## 2019-11-21 DIAGNOSIS — Z794 Long term (current) use of insulin: Secondary | ICD-10-CM | POA: Diagnosis not present

## 2019-11-21 DIAGNOSIS — E039 Hypothyroidism, unspecified: Secondary | ICD-10-CM

## 2019-11-21 DIAGNOSIS — E1142 Type 2 diabetes mellitus with diabetic polyneuropathy: Secondary | ICD-10-CM

## 2019-11-21 DIAGNOSIS — I10 Essential (primary) hypertension: Secondary | ICD-10-CM

## 2019-11-21 LAB — URINALYSIS, ROUTINE W REFLEX MICROSCOPIC
Bilirubin Urine: NEGATIVE
Hgb urine dipstick: NEGATIVE
Ketones, ur: NEGATIVE
Nitrite: NEGATIVE
RBC / HPF: NONE SEEN (ref 0–?)
Specific Gravity, Urine: 1.015 (ref 1.000–1.030)
Total Protein, Urine: NEGATIVE
Urine Glucose: >=1000 — AB
Urobilinogen, UA: 0.2 (ref 0.0–1.0)
pH: 5 (ref 5.0–8.0)

## 2019-11-21 LAB — LIPID PANEL
Cholesterol: 124 mg/dL (ref 0–200)
HDL: 29.1 mg/dL — ABNORMAL LOW (ref >39.00)
LDL Cholesterol: 63 mg/dL (ref 0–99)
NonHDL: 94.62
Total CHOL/HDL Ratio: 4
Triglycerides: 159 mg/dL — ABNORMAL HIGH (ref 0.0–149.0)
VLDL: 31.8 mg/dL (ref 0.0–40.0)

## 2019-11-21 LAB — COMPREHENSIVE METABOLIC PANEL
ALT: 12 U/L (ref 0–35)
AST: 15 U/L (ref 0–37)
Albumin: 4.2 g/dL (ref 3.5–5.2)
Alkaline Phosphatase: 44 U/L (ref 39–117)
BUN: 20 mg/dL (ref 6–23)
CO2: 27 mEq/L (ref 19–32)
Calcium: 9.3 mg/dL (ref 8.4–10.5)
Chloride: 103 mEq/L (ref 96–112)
Creatinine, Ser: 1.07 mg/dL (ref 0.40–1.20)
GFR: 53.07 mL/min — ABNORMAL LOW (ref >60.00)
Glucose, Bld: 97 mg/dL (ref 70–99)
Potassium: 4 mEq/L (ref 3.5–5.1)
Sodium: 139 mEq/L (ref 135–145)
Total Bilirubin: 0.4 mg/dL (ref 0.2–1.2)
Total Protein: 7.1 g/dL (ref 6.0–8.3)

## 2019-11-21 LAB — MICROALBUMIN / CREATININE URINE RATIO
Creatinine,U: 65 mg/dL
Microalb Creat Ratio: 1.1 mg/g (ref 0.0–30.0)
Microalb, Ur: 0.7 mg/dL (ref 0.0–1.9)

## 2019-11-21 LAB — CBC WITH DIFFERENTIAL/PLATELET
Basophils Absolute: 0 10*3/uL (ref 0.0–0.1)
Basophils Relative: 0.7 % (ref 0.0–3.0)
Eosinophils Absolute: 0.2 10*3/uL (ref 0.0–0.7)
Eosinophils Relative: 3.8 % (ref 0.0–5.0)
HCT: 37 % (ref 36.0–46.0)
Hemoglobin: 11.8 g/dL — ABNORMAL LOW (ref 12.0–15.0)
Lymphocytes Relative: 33.3 % (ref 12.0–46.0)
Lymphs Abs: 1.8 10*3/uL (ref 0.7–4.0)
MCHC: 31.9 g/dL (ref 30.0–36.0)
MCV: 75.2 fl — ABNORMAL LOW (ref 78.0–100.0)
Monocytes Absolute: 0.5 10*3/uL (ref 0.1–1.0)
Monocytes Relative: 9 % (ref 3.0–12.0)
Neutro Abs: 2.9 10*3/uL (ref 1.4–7.7)
Neutrophils Relative %: 53.2 % (ref 43.0–77.0)
Platelets: 255 10*3/uL (ref 150.0–400.0)
RBC: 4.92 Mil/uL (ref 3.87–5.11)
RDW: 15 % (ref 11.5–15.5)
WBC: 5.4 10*3/uL (ref 4.0–10.5)

## 2019-11-21 LAB — TSH: TSH: 4.47 u[IU]/mL (ref 0.35–4.50)

## 2019-11-27 ENCOUNTER — Other Ambulatory Visit: Payer: Self-pay

## 2019-11-27 ENCOUNTER — Encounter: Payer: Self-pay | Admitting: Endocrinology

## 2019-11-27 ENCOUNTER — Ambulatory Visit (INDEPENDENT_AMBULATORY_CARE_PROVIDER_SITE_OTHER): Payer: Medicare Other | Admitting: Endocrinology

## 2019-11-27 VITALS — BP 138/62 | HR 78 | Ht 62.0 in | Wt 144.0 lb

## 2019-11-27 DIAGNOSIS — E1142 Type 2 diabetes mellitus with diabetic polyneuropathy: Secondary | ICD-10-CM

## 2019-11-27 DIAGNOSIS — Z794 Long term (current) use of insulin: Secondary | ICD-10-CM

## 2019-11-27 LAB — POCT GLYCOSYLATED HEMOGLOBIN (HGB A1C): Hemoglobin A1C: 7.2 % — AB (ref 4.0–5.6)

## 2019-11-27 NOTE — Progress Notes (Signed)
Subjective:    Patient ID: [facility], female    DOB: 23-Jun-1950, 69 y.o.   MRN: [id number]  HPI Pt returns for f/u of diabetes mellitus: DM type: Insulin-requiring type 2 Dx'ed: 1998 Complications: PN Therapy: insulin since 2003, and 2 oral meds.   GDM: 1976 DKA: never Severe hypoglycemia: never.  Pancreatitis: never.   Pancreatic imaging: normal on 2017 CT.   Other: she declines multiple daily injections.   Interval history: no cbg record, but pt says cbg's vary from 86-130.  There is no trend throughout the day.  She never misses the insulin.  pt states she feels well in general.  She will soon go to [place] x 5 months.  Past Medical History:  Diagnosis Date  . Arthritis   . Chicken pox 1967  . Diabetes mellitus without complication (HCC)   . GERD (gastroesophageal reflux disease)   . Headache   . Hyperlipidemia   . Hypertension   . Mass of left breast   . Polyneuropathy   . Thyroid disease     Past Surgical History:  Procedure Laterality Date  . ABDOMINAL HYSTERECTOMY  05/2010  . APPENDECTOMY  1984  . BREAST LUMPECTOMY WITH RADIOACTIVE SEED LOCALIZATION Left 03/16/2017   Procedure: LEFT BREAST LUMPECTOMY WITH RADIOACTIVE SEED LOCALIZATION ERAS PATHWAY;  Surgeon: Claud Kelp, MD;  Location: [facility] OR;  Service: General;  Laterality: Left;  . CATARACT EXTRACTION W/ INTRAOCULAR LENS  IMPLANT, BILATERAL    . CHOLECYSTECTOMY  2011    Social History   Socioeconomic History  . Marital status: Widowed    Spouse name: Not on file  . Number of children: Not on file  . Years of education: Not on file  . Highest education level: Not on file  Occupational History  . Not on file  Tobacco Use  . Smoking status: Never Smoker  . Smokeless tobacco: Never Used  Vaping Use  . Vaping Use: Never used  Substance and Sexual Activity  . Alcohol use: No  . Drug use: No  . Sexual activity: Not Currently  Other Topics Concern  . Not on file  Social History Narrative  . Not  on file   Social Determinants of Health   Financial Resource Strain:   . Difficulty of Paying Living Expenses: Not on file  Food Insecurity:   . Worried About Programme researcher, broadcasting/film/video in the Last Year: Not on file  . Ran Out of Food in the Last Year: Not on file  Transportation Needs:   . Lack of Transportation (Medical): Not on file  . Lack of Transportation (Non-Medical): Not on file  Physical Activity:   . Days of Exercise per Week: Not on file  . Minutes of Exercise per Session: Not on file  Stress:   . Feeling of Stress : Not on file  Social Connections:   . Frequency of Communication with Friends and Family: Not on file  . Frequency of Social Gatherings with Friends and Family: Not on file  . Attends Religious Services: Not on file  . Active Member of Clubs or Organizations: Not on file  . Attends Banker Meetings: Not on file  . Marital Status: Not on file  Intimate Partner Violence:   . Fear of Current or Ex-Partner: Not on file  . Emotionally Abused: Not on file  . Physically Abused: Not on file  . Sexually Abused: Not on file    Current Outpatient Medications on File Prior to Visit  Medication Sig  Dispense Refill  . aspirin 81 MG chewable tablet Chew 1 tablet (81 mg total) by mouth daily. 90 tablet 3  . chlorhexidine (PERIDEX) 0.12 % solution     . Cholecalciferol (VITAMIN D3) 50 MCG (2000 UT) capsule Take 1 capsule (2,000 Units total) by mouth daily. 100 capsule 3  . diltiazem (CARDIZEM) 30 MG tablet Take 1 tablet (30 mg total) by mouth 2 (two) times daily. 360 tablet 1  . Empagliflozin-metFORMIN HCl (SYNJARDY) 05-998 MG TABS Take 1 tablet by mouth 2 (two) times daily. 360 tablet 1  . famotidine (PEPCID) 40 MG tablet Take 1 tablet (40 mg total) by mouth daily. 180 tablet 1  . glucose blood (RELION GLUCOSE TEST STRIPS) test strip Use as instructed qid ac and hs 300 each 3  . insulin detemir (LEVEMIR FLEXTOUCH) 100 UNIT/ML FlexPen 32 u in am and 24 u at hs 15  mL 11  . Insulin Pen Needle 31G X 5 MM MISC 1 Units by Does not apply route 4 (four) times daily. 300 each 3  . levothyroxine (SYNTHROID) 50 MCG tablet Take 1 tablet (50 mcg total) by mouth daily. 180 tablet 1  . LORazepam (ATIVAN) 0.5 MG tablet Take 1 tablet (0.5 mg total) by mouth 2 (two) times daily as needed (anxiety or tremor). 60 tablet 2  . losartan (COZAAR) 50 MG tablet Take 1 tablet (50 mg total) by mouth daily. 180 tablet 1  . ReliOn Lancet Devices 30G MISC As directed 1 each 1  . ReliOn Lancets Standard 21G MISC As directed 200 each 3  . simvastatin (ZOCOR) 10 MG tablet Take 1 tablet (10 mg total) by mouth at bedtime. 180 tablet 1  . vitamin B-12 (CYANOCOBALAMIN) 1000 MCG tablet Take 1 tablet (1,000 mcg total) by mouth daily. 180 tablet 3   No current facility-administered medications on file prior to visit.    Allergies  Allergen Reactions  . Penicillins Swelling and Other (See Comments)    Pt collapsed 1972-  Fasting month ?vaso-vagal after blood was taken. No rash Has patient had a PCN reaction causing immediate rash, facial/tongue/throat swelling, SOB or lightheadedness with hypotension:  Has patient had a PCN reaction causing severe rash involving mucus membranes or skin necrosis:  Has patient had a PCN reaction that required hospitalization:  Has patient had a PCN reaction occurring within the last 10 years:  If all of the above answers are "NO", then may proceed with Cephalospor  . Sulfa Antibiotics Itching    Family History  Problem Relation Age of Onset  . Stroke Father   . Diabetes Father   . Heart disease Father 75       with MI  . Hypertension Father   . Hyperlipidemia Father     BP 138/62   Pulse 78   Ht 5\' 2"  (1.575 m)   Wt 144 lb (65.3 kg)   SpO2 99%   BMI 26.34 kg/m    Review of Systems She denies hypoglycemia.      Objective:   Physical Exam VITAL SIGNS:  See vs page GENERAL: no distress Pulses: dorsalis pedis intact bilat.   MSK: no  deformity of the feet CV: no leg edema Skin:  no ulcer on the feet.  normal color and temp on the feet. Neuro: sensation is intact to touch on the feet  Lab Results  Component Value Date   HGBA1C 7.2 (A) 11/27/2019   Lab Results  Component Value Date   CREATININE 1.07 11/21/2019  BUN 20 11/21/2019   NA 139 11/21/2019   K 4.0 11/21/2019   CL 103 11/21/2019   CO2 27 11/21/2019      Assessment & Plan:  Insulin-requiring type 2 DM, with PN: this is the best control this pt should aim for, given this regimen, which does match insulin to her changing needs throughout the day.  We discussed addition of GLP, but she declines to adjust medication now.    Patient Instructions  check your blood sugar twice a day.  vary the time of day when you check, between before the 3 meals, and at bedtime.  also check if you have symptoms of your blood sugar being too high or too low.  please keep a record of the readings and bring it to your next appointment here (or you can bring the meter itself).  You can write it on any piece of paper.  please call us sooner if your blood sugar goes below 70, or if you have a lot of readings over 200.   Please continue the same insulin.    Please continue the same synjardy.   Please come back for a follow-up appointment in 3-4 months.  ???? ????? ????? ????? ?????? ??????? ????? ????? ???? ????????? ??? ??? ???? ??????? ????, 3 ??????? ??? ??? ????? ????? ????? ????? ?????? ??? ???? ?? ??? ?? ?????? ????? ?????? ???? ??? ??????? ????? ??????? ??? ????????? ???? ?????? ????? ??? ??? ????? ??????? ????????????????? ????? ???? (???? ???? ????? ??????? ???? ?????)? ???? ??? ?? ???? ????? ????? ?????? ????? ????? ????? 70-?? ???? ??? ???? ?? 200-?? ???? ????? ???? ????? ????? ??????? ??? ?????????? ?????? ?? ????? ??????? ??? ??? ??????? ??????? ???? ??????? ??? ??? ????????? ??????? ???? ??????? ??? 3-4 ????? ????? ???? ???-?? ?????????????????? ???? ???? ?????     Diabetic DME Suppliers:  [email or web address] [phone] Fax [phone]  [email or web address] [phone] Fax [phone]  [email or web address] [phone] ext [phone] Fax [phone]  [email or web address] [phone] Fax [phone]  [facility] [email or web address] [phone] Fax [phone]  [email or web address] [phone] Fax [phone]  [email or web address] [phone] Fax [phone]  [email or web address] [phone] Fax [phone]  [email or web address] [phone] option #1 Fax [phone]  [email or web address] [phone] Fax [phone]  [place] [facility] [phone] Fax [phone]  [place] [email or web address] [phone] Fax [phone]

## 2019-11-27 NOTE — Patient Instructions (Addendum)
check your blood sugar twice a day.  vary the time of day when you check, between before the 3 meals, and at bedtime.  also check if you have symptoms of your blood sugar being too high or too low.  please keep a record of the readings and bring it to your next appointment here (or you can bring the meter itself).  You can write it on any piece of paper.  please call us sooner if your blood sugar goes below 70, or if you have a lot of readings over 200.   Please continue the same insulin.    Please continue the same synjardy.   Please come back for a follow-up appointment in 3-4 months.  ???? ????? ????? ????? ?????? ??????? ????? ????? ???? ????????? ??? ??? ???? ??????? ????, 3 ??????? ??? ??? ????? ????? ????? ????? ?????? ??? ???? ?? ??? ?? ?????? ????? ?????? ???? ??? ??????? ????? ??????? ??? ????????? ???? ?????? ????? ??? ??? ????? ??????? ????????????????? ????? ???? (???? ???? ????? ??????? ???? ?????)? ???? ??? ?? ???? ????? ????? ?????? ????? ????? ????? 70-?? ???? ??? ???? ?? 200-?? ???? ????? ???? ????? ????? ??????? ??? ?????????? ?????? ?? ????? ??????? ??? ??? ??????? ??????? ???? ??????? ??? ??? ????????? ??????? ???? ??????? ??? 3-4 ????? ????? ???? ???-?? ?????????????????? ???? ???? ?????    Diabetic DME Suppliers:  [email or web address] [phone] Fax [phone]  [email or web address] [phone] Fax [phone]  [email or web address] [phone] ext [phone] Fax [phone]  [email or web address] [phone] Fax [phone]  [email or web address] [phone] Fax [phone]  [email or web address] [phone] Fax [phone]  [facility] [email or web address] [phone] Fax [phone]  [email or web address] [phone] Fax [phone]  [email or web address] [phone] option #1 Fax [phone]  [email or web address] [phone] Fax [phone]  [place] [facility] [phone] Fax [phone]  [place] [email or web address] [phone] Fax [phone]

## 2019-12-09 ENCOUNTER — Telehealth: Payer: Self-pay

## 2019-12-09 NOTE — Telephone Encounter (Signed)
[facility], can you find this form?

## 2019-12-09 NOTE — Telephone Encounter (Signed)
I do not have the form on my desk

## 2019-12-09 NOTE — Telephone Encounter (Signed)
Please see below.

## 2019-12-09 NOTE — Telephone Encounter (Signed)
New message    [facility] calling to check on status of form sent 11.9.2021 .    Please advise

## 2019-12-12 NOTE — Telephone Encounter (Signed)
[facility] - ph# [phone]  Fax #[phone]

## 2019-12-12 NOTE — Telephone Encounter (Signed)
[facility],   Have you seen this? I could not find it on Tuesday or it is just me?

## 2019-12-16 NOTE — Telephone Encounter (Signed)
Called and notified them that we did not see the form- but to refax it again to the back fax- fax number was given.   I advised that we would work on getting this form filled out.  Left call back number as well.

## 2020-01-06 DIAGNOSIS — Z23 Encounter for immunization: Secondary | ICD-10-CM | POA: Diagnosis not present

## 2020-01-28 ENCOUNTER — Encounter: Payer: Self-pay | Admitting: Internal Medicine

## 2020-01-28 ENCOUNTER — Other Ambulatory Visit: Payer: Self-pay | Admitting: Internal Medicine

## 2020-01-28 DIAGNOSIS — Z794 Long term (current) use of insulin: Secondary | ICD-10-CM

## 2020-01-28 DIAGNOSIS — E039 Hypothyroidism, unspecified: Secondary | ICD-10-CM

## 2020-01-28 DIAGNOSIS — I10 Essential (primary) hypertension: Secondary | ICD-10-CM

## 2020-01-28 DIAGNOSIS — E1142 Type 2 diabetes mellitus with diabetic polyneuropathy: Secondary | ICD-10-CM

## 2020-01-28 NOTE — Telephone Encounter (Signed)
error 

## 2020-04-26 ENCOUNTER — Other Ambulatory Visit: Payer: Self-pay | Admitting: Internal Medicine

## 2020-04-26 DIAGNOSIS — Z794 Long term (current) use of insulin: Secondary | ICD-10-CM

## 2020-04-26 DIAGNOSIS — E1142 Type 2 diabetes mellitus with diabetic polyneuropathy: Secondary | ICD-10-CM

## 2020-07-12 ENCOUNTER — Encounter: Payer: Self-pay | Admitting: Internal Medicine

## 2020-07-12 ENCOUNTER — Other Ambulatory Visit: Payer: Self-pay

## 2020-07-12 ENCOUNTER — Ambulatory Visit (INDEPENDENT_AMBULATORY_CARE_PROVIDER_SITE_OTHER): Payer: Medicare Other | Admitting: Internal Medicine

## 2020-07-12 VITALS — BP 120/60 | HR 66 | Temp 98.1°F | Ht 62.0 in | Wt 139.0 lb

## 2020-07-12 DIAGNOSIS — E1142 Type 2 diabetes mellitus with diabetic polyneuropathy: Secondary | ICD-10-CM | POA: Diagnosis not present

## 2020-07-12 DIAGNOSIS — E538 Deficiency of other specified B group vitamins: Secondary | ICD-10-CM | POA: Diagnosis not present

## 2020-07-12 DIAGNOSIS — E785 Hyperlipidemia, unspecified: Secondary | ICD-10-CM

## 2020-07-12 DIAGNOSIS — H6121 Impacted cerumen, right ear: Secondary | ICD-10-CM | POA: Diagnosis not present

## 2020-07-12 DIAGNOSIS — F419 Anxiety disorder, unspecified: Secondary | ICD-10-CM

## 2020-07-12 DIAGNOSIS — Z794 Long term (current) use of insulin: Secondary | ICD-10-CM

## 2020-07-12 DIAGNOSIS — I10 Essential (primary) hypertension: Secondary | ICD-10-CM

## 2020-07-12 DIAGNOSIS — H6120 Impacted cerumen, unspecified ear: Secondary | ICD-10-CM | POA: Insufficient documentation

## 2020-07-12 LAB — COMPREHENSIVE METABOLIC PANEL
ALT: 13 U/L (ref 0–35)
AST: 16 U/L (ref 0–37)
Albumin: 4.1 g/dL (ref 3.5–5.2)
Alkaline Phosphatase: 43 U/L (ref 39–117)
BUN: 22 mg/dL (ref 6–23)
CO2: 27 mEq/L (ref 19–32)
Calcium: 9.4 mg/dL (ref 8.4–10.5)
Chloride: 103 mEq/L (ref 96–112)
Creatinine, Ser: 1.14 mg/dL (ref 0.40–1.20)
GFR: 48.96 mL/min — ABNORMAL LOW (ref >60.00)
Glucose, Bld: 110 mg/dL — ABNORMAL HIGH (ref 70–99)
Potassium: 4 mEq/L (ref 3.5–5.1)
Sodium: 138 mEq/L (ref 135–145)
Total Bilirubin: 0.3 mg/dL (ref 0.2–1.2)
Total Protein: 6.9 g/dL (ref 6.0–8.3)

## 2020-07-12 LAB — URINALYSIS
Bilirubin Urine: NEGATIVE
Hgb urine dipstick: NEGATIVE
Ketones, ur: NEGATIVE
Leukocytes,Ua: NEGATIVE
Nitrite: NEGATIVE
Specific Gravity, Urine: 1.01 (ref 1.000–1.030)
Total Protein, Urine: NEGATIVE
Urine Glucose: >=1000 — AB
Urobilinogen, UA: 0.2 (ref 0.0–1.0)
pH: 5.5 (ref 5.0–8.0)

## 2020-07-12 LAB — MICROALBUMIN / CREATININE URINE RATIO
Creatinine,U: 52.3 mg/dL
Microalb Creat Ratio: 1.3 mg/g (ref 0.0–30.0)
Microalb, Ur: <0.7 mg/dL (ref 0.0–1.9)

## 2020-07-12 LAB — LIPID PANEL
Cholesterol: 115 mg/dL (ref 0–200)
HDL: 30.8 mg/dL — ABNORMAL LOW (ref >39.00)
LDL Cholesterol: 52 mg/dL (ref 0–99)
NonHDL: 83.81
Total CHOL/HDL Ratio: 4
Triglycerides: 161 mg/dL — ABNORMAL HIGH (ref 0.0–149.0)
VLDL: 32.2 mg/dL (ref 0.0–40.0)

## 2020-07-12 LAB — VITAMIN B12: Vitamin B-12: 520 pg/mL (ref 211–911)

## 2020-07-12 LAB — TSH: TSH: 2.25 u[IU]/mL (ref 0.35–4.50)

## 2020-07-12 LAB — HEMOGLOBIN A1C: Hgb A1c MFr Bld: 8 % — ABNORMAL HIGH (ref 4.6–6.5)

## 2020-07-12 NOTE — Assessment & Plan Note (Signed)
R -  use a wax removing kit RTC if needs a procedure 

## 2020-07-12 NOTE — Addendum Note (Signed)
Addended by: Madelon Lips on: 07/12/2020 09:49 AM   Modules accepted: Orders

## 2020-07-12 NOTE — Progress Notes (Signed)
Subjective:  Patient ID: Barbara White, female    DOB: [birth year]  Age: 70 y.o. MRN: [id number]  CC: Follow-up (4-5 month f/u)   HPI Barbara White presents for HTN, DM, GERD, dyslipidemia. Pt came back from [place] 1 wk ago.  Outpatient Medications Prior to Visit  Medication Sig Dispense Refill   aspirin 81 MG chewable tablet Chew 1 tablet (81 mg total) by mouth daily. 90 tablet 3   chlorhexidine (PERIDEX) 0.12 % solution      Cholecalciferol (VITAMIN D3) 50 MCG (2000 UT) capsule Take 1 capsule (2,000 Units total) by mouth daily. 100 capsule 3   diltiazem (CARDIZEM) 30 MG tablet TAKE ONE TABLET BY MOUTH TWICE A DAY 180 tablet 1   famotidine (PEPCID) 40 MG tablet Take 1 tablet (40 mg total) by mouth daily. 180 tablet 1   glucose blood (RELION GLUCOSE TEST STRIPS) test strip Use as instructed qid ac and hs 300 each 3   insulin detemir (LEVEMIR FLEXTOUCH) 100 UNIT/ML FlexPen INJECT UNDER THE SKIN 50 UNITS EVERY MORNING AND 5 UNITS EVERY EVENING 45 mL 2   Insulin Pen Needle 31G X 5 MM MISC 1 Units by Does not apply route 4 (four) times daily. 300 each 3   levothyroxine (SYNTHROID) 50 MCG tablet TAKE ONE TABLET BY MOUTH DAILY 90 tablet 1   LORazepam (ATIVAN) 0.5 MG tablet Take 1 tablet (0.5 mg total) by mouth 2 (two) times daily as needed (anxiety or tremor). 60 tablet 2   losartan (COZAAR) 50 MG tablet TAKE ONE TABLET BY MOUTH DAILY 90 tablet 1   ReliOn Lancet Devices 30G MISC As directed 1 each 1   ReliOn Lancets Standard 21G MISC As directed 200 each 3   simvastatin (ZOCOR) 10 MG tablet Take 1 tablet (10 mg total) by mouth at bedtime. 180 tablet 1   simvastatin (ZOCOR) 20 MG tablet TAKE 1 TABLET BY MOUTH EVERY EVENING AT 6 IN THE EVENING 90 tablet 2   SYNJARDY 05-998 MG TABS TAKE 1 TABLET BY MOUTH TWO TIMES A DAY 180 tablet 1   vitamin B-12 (CYANOCOBALAMIN) 1000 MCG tablet Take 1 tablet (1,000 mcg total) by mouth daily. 180 tablet 3   No facility-administered medications prior to visit.     ROS: Review of Systems  Constitutional:  Negative for activity change, appetite change, chills, fatigue and unexpected weight change.  HENT:  Negative for congestion, mouth sores and sinus pressure.   Eyes:  Negative for visual disturbance.  Respiratory:  Negative for cough and chest tightness.   Gastrointestinal:  Negative for abdominal pain and nausea.  Genitourinary:  Negative for difficulty urinating, frequency and vaginal pain.  Musculoskeletal:  Negative for back pain and gait problem.  Skin:  Negative for pallor and rash.  Neurological:  Negative for dizziness, tremors, weakness, numbness and headaches.  Psychiatric/Behavioral:  Negative for confusion and sleep disturbance. The patient is not nervous/anxious.    Objective:  BP 120/60 (BP Location: Left Arm)   Pulse 66   Temp 98.1 F (36.7 C) (Oral)   Ht 5\' 2"  (1.575 m)   Wt 139 lb (63 kg)   SpO2 95%   BMI 25.42 kg/m   BP Readings from Last 3 Encounters:  07/12/20 120/60  11/27/19 138/62  11/13/19 118/68    Wt Readings from Last 3 Encounters:  07/12/20 139 lb (63 kg)  11/27/19 144 lb (65.3 kg)  11/13/19 142 lb (64.4 kg)    Physical Exam Constitutional:      General:  She is not in acute distress.    Appearance: Normal appearance. She is well-developed.  HENT:     Head: Normocephalic.     Right Ear: External ear normal.     Left Ear: External ear normal.     Nose: Nose normal.  Eyes:     General:        Right eye: No discharge.        Left eye: No discharge.     Conjunctiva/sclera: Conjunctivae normal.     Pupils: Pupils are equal, round, and reactive to light.  Neck:     Thyroid: No thyromegaly.     Vascular: No JVD.     Trachea: No tracheal deviation.  Cardiovascular:     Rate and Rhythm: Normal rate and regular rhythm.     Heart sounds: Normal heart sounds.  Pulmonary:     Effort: No respiratory distress.     Breath sounds: No stridor. No wheezing.  Abdominal:     General: Bowel sounds are  normal. There is no distension.     Palpations: Abdomen is soft. There is no mass.     Tenderness: There is no abdominal tenderness. There is no guarding or rebound.  Musculoskeletal:        General: No tenderness.     Cervical back: Normal range of motion and neck supple. No rigidity.  Lymphadenopathy:     Cervical: No cervical adenopathy.  Skin:    Findings: No erythema or rash.  Neurological:     Cranial Nerves: No cranial nerve deficit.     Motor: No abnormal muscle tone.     Coordination: Coordination normal.     Deep Tendon Reflexes: Reflexes normal.  Psychiatric:        Behavior: Behavior normal.        Thought Content: Thought content normal.        Judgment: Judgment normal.  R wax  Lab Results  Component Value Date   WBC 5.4 11/21/2019   HGB 11.8 (L) 11/21/2019   HCT 37.0 11/21/2019   PLT 255.0 11/21/2019   GLUCOSE 97 11/21/2019   CHOL 124 11/21/2019   TRIG 159.0 (H) 11/21/2019   HDL 29.10 (L) 11/21/2019   LDLDIRECT 86.0 04/29/2019   LDLCALC 63 11/21/2019   ALT 12 11/21/2019   AST 15 11/21/2019   NA 139 11/21/2019   K 4.0 11/21/2019   CL 103 11/21/2019   CREATININE 1.07 11/21/2019   BUN 20 11/21/2019   CO2 27 11/21/2019   TSH 4.47 11/21/2019   HGBA1C 7.2 (A) 11/27/2019   MICROALBUR 0.7 11/21/2019    No results found.  Assessment & Plan:     Follow-up: No follow-ups on file.  Sonda Primes, MD

## 2020-07-12 NOTE — Assessment & Plan Note (Addendum)
Cont w/Diltiazem, Losartan Check CMET

## 2020-07-12 NOTE — Assessment & Plan Note (Signed)
On B12 Will check the level

## 2020-07-12 NOTE — Assessment & Plan Note (Signed)
Cont w/Levemir and [employer]

## 2020-07-12 NOTE — Assessment & Plan Note (Signed)
Lorazepam prn - not taking now

## 2020-08-11 ENCOUNTER — Ambulatory Visit (INDEPENDENT_AMBULATORY_CARE_PROVIDER_SITE_OTHER): Payer: Medicare Other | Admitting: Endocrinology

## 2020-08-11 ENCOUNTER — Other Ambulatory Visit: Payer: Self-pay

## 2020-08-11 VITALS — BP 110/56 | HR 71 | Ht 62.0 in | Wt 140.0 lb

## 2020-08-11 DIAGNOSIS — E1142 Type 2 diabetes mellitus with diabetic polyneuropathy: Secondary | ICD-10-CM

## 2020-08-11 DIAGNOSIS — Z794 Long term (current) use of insulin: Secondary | ICD-10-CM

## 2020-08-11 LAB — POCT GLYCOSYLATED HEMOGLOBIN (HGB A1C): Hemoglobin A1C: 7.6 % — AB (ref 4.0–5.6)

## 2020-08-11 MED ORDER — RYBELSUS 7 MG PO TABS: 7.0000 mg | ORAL_TABLET | Freq: Every day | ORAL | 3 refills | Status: DC

## 2020-08-11 MED ORDER — METFORMIN HCL ER 500 MG PO TB24: 2000.0000 mg | ORAL_TABLET | Freq: Every day | ORAL | 3 refills | Status: DC

## 2020-08-11 MED ORDER — LEVEMIR FLEXTOUCH 100 UNIT/ML ~~LOC~~ SOPN
PEN_INJECTOR | SUBCUTANEOUS | 2 refills | Status: DC
Start: 2020-08-11 — End: 2020-12-13

## 2020-08-11 MED ORDER — ONETOUCH VERIO VI STRP: 1.0000 | ORAL_STRIP | Freq: Two times a day (BID) | 12 refills | Status: DC

## 2020-08-11 NOTE — Progress Notes (Signed)
Subjective:    Patient ID: Barbara White, female    DOB: Dec 02, 1950, 70 y.o.   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   GDM: 1976 DKA: never Severe hypoglycemia: never.  Pancreatitis: never.   Pancreatic imaging: normal on 2017 CT.   Other: she declines multiple daily injections.   Interval history: no cbg record, but pt says cbg's vary from 87-180. It is in general higher as the day goes on. She never misses the insulin.  pt states she feels well in general.  She takes 30 units qam and 25 units qpm.   Past Medical History:  Diagnosis Date   Arthritis    Chicken pox 1967   Diabetes mellitus without complication (HCC)    GERD (gastroesophageal reflux disease)    Headache    Hyperlipidemia    Hypertension    Mass of left breast    Polyneuropathy    Thyroid disease     Past Surgical History:  Procedure Laterality Date   ABDOMINAL HYSTERECTOMY  05/2010   APPENDECTOMY  1984   BREAST LUMPECTOMY WITH RADIOACTIVE SEED LOCALIZATION Left 03/16/2017   Procedure: LEFT BREAST LUMPECTOMY WITH RADIOACTIVE SEED LOCALIZATION ERAS PATHWAY;  Surgeon: Claud Kelp, MD;  Location: [facility] OR;  Service: General;  Laterality: Left;   CATARACT EXTRACTION W/ INTRAOCULAR LENS  IMPLANT, BILATERAL     CHOLECYSTECTOMY  2011    Social History   Socioeconomic History   Marital status: Widowed    Spouse name: Not on file   Number of children: Not on file   Years of education: Not on file   Highest education level: Not on file  Occupational History   Not on file  Tobacco Use   Smoking status: Never   Smokeless tobacco: Never  Vaping Use   Vaping Use: Never used  Substance and Sexual Activity   Alcohol use: No   Drug use: No   Sexual activity: Not Currently  Other Topics Concern   Not on file  Social History Narrative   Not on file   Social Determinants of  Health   Financial Resource Strain: Not on file  Food Insecurity: Not on file  Transportation Needs: Not on file  Physical Activity: Not on file  Stress: Not on file  Social Connections: Not on file  Intimate Partner Violence: Not on file    Current Outpatient Medications on File Prior to Visit  Medication Sig Dispense Refill   aspirin 81 MG chewable tablet Chew 1 tablet (81 mg total) by mouth daily. 90 tablet 3   chlorhexidine (PERIDEX) 0.12 % solution      Cholecalciferol (VITAMIN D3) 50 MCG (2000 UT) capsule Take 1 capsule (2,000 Units total) by mouth daily. 100 capsule 3   diltiazem (CARDIZEM) 30 MG tablet TAKE ONE TABLET BY MOUTH TWICE A DAY 180 tablet 1   famotidine (PEPCID) 40 MG tablet Take 1 tablet (40 mg total) by mouth daily. 180 tablet 1   Insulin Pen Needle 31G X 5 MM MISC 1 Units by Does not apply route 4 (four) times daily. 300 each 3   levothyroxine (SYNTHROID) 50 MCG tablet TAKE ONE TABLET BY MOUTH DAILY 90 tablet 1   LORazepam (ATIVAN) 0.5 MG tablet Take 1 tablet (0.5 mg total) by mouth 2 (two) times daily as needed (anxiety or tremor). 60 tablet 2   losartan (COZAAR) 50 MG  tablet TAKE ONE TABLET BY MOUTH DAILY 90 tablet 1   simvastatin (ZOCOR) 20 MG tablet TAKE 1 TABLET BY MOUTH EVERY EVENING AT 6 IN THE EVENING 90 tablet 2   vitamin B-12 (CYANOCOBALAMIN) 1000 MCG tablet Take 1 tablet (1,000 mcg total) by mouth daily. 180 tablet 3   No current facility-administered medications on file prior to visit.    Allergies  Allergen Reactions   Penicillins Swelling and Other (See Comments)    Pt collapsed 1972-  Fasting month ?vaso-vagal after blood was taken. No rash Has patient had a PCN reaction causing immediate rash, facial/tongue/throat swelling, SOB or lightheadedness with hypotension:  Has patient had a PCN reaction causing severe rash involving mucus membranes or skin necrosis:  Has patient had a PCN reaction that required hospitalization:  Has patient had a PCN  reaction occurring within the last 10 years:  If all of the above answers are "NO", then may proceed with Cephalospor   Sulfa Antibiotics Itching    Family History  Problem Relation Age of Onset   Stroke Father    Diabetes Father    Heart disease Father 42       with MI   Hypertension Father    Hyperlipidemia Father     BP (!) 110/56 (BP Location: Right Arm, Patient Position: Sitting, Cuff Size: Normal)   Pulse 71   Ht 5\' 2"  (1.575 m)   Wt 140 lb (63.5 kg)   SpO2 94%   BMI 25.61 kg/m    Review of Systems     Objective:   Physical Exam Pulses: dorsalis pedis intact bilat.   MSK: no deformity of the feet CV: no leg edema Skin:  no ulcer on the feet.  normal color and temp on the feet.   Neuro: sensation is intact to touch on the feet.    Lab Results  Component Value Date   CREATININE 1.14 07/12/2020   BUN 22 07/12/2020   NA 138 07/12/2020   K 4.0 07/12/2020   CL 103 07/12/2020   CO2 27 07/12/2020    A1c=7.6%    Assessment & Plan:  Insulin-requiring type 2 DM: uncontrolled.    Patient Instructions  check your blood sugar twice a day.  vary the time of day when you check, between before the 3 meals, and at bedtime.  also check if you have symptoms of your blood sugar being too high or too low.  please keep a record of the readings and bring it to your next appointment here (or you can bring the meter itself).  You can write it on any piece of paper.  please call 07/14/2020 sooner if your blood sugar goes below 70, or if you have a lot of readings over 200.   When you run out of synjardy, please change to the diabetes meds as below.    Please come back for a follow-up appointment in 4 months.   ???? ????? ????? ????? ?????? ??????? ????? ????? ???? ????????? ??? ??? ???? ??????? ????, 3 ??????? ??? ??? ????? ????? ????? ????? ?????? ??? ???? ?? ??? ?? ?????? ????? ?????? ???? ??? ??????? ????? ??????? ??? ????????? ???? ?????? ????? ??? ??? ????? ??????? ?????????????????  ????? ???? (???? ???? ????? ??????? ???? ?????)? ???? ??? ?? ???? ????? ????? ?????? ????? ????? ????? 70-?? ???? ??? ???? ?? 200-?? ???? ????? ???? ????? ????? ??????? ??? ?????????? ?????? ?? ????? ????? ????????? ??????? ????, ??????? ??? ????? ?? ?????????? ????? ???????? ????? ??????? ???  4 ????? ????? ???? ???-?? ?????????????????? ???? ???? ?????   

## 2020-08-11 NOTE — Patient Instructions (Addendum)
check your blood sugar twice a day.  vary the time of day when you check, between before the 3 meals, and at bedtime.  also check if you have symptoms of your blood sugar being too high or too low.  please keep a record of the readings and bring it to your next appointment here (or you can bring the meter itself).  You can write it on any piece of paper.  please call us sooner if your blood sugar goes below 70, or if you have a lot of readings over 200.   When you run out of synjardy, please change to the diabetes meds as below.    Please come back for a follow-up appointment in 4 months.   ???? ????? ????? ????? ?????? ??????? ????? ????? ???? ????????? ??? ??? ???? ??????? ????, 3 ??????? ??? ??? ????? ????? ????? ????? ?????? ??? ???? ?? ??? ?? ?????? ????? ?????? ???? ??? ??????? ????? ??????? ??? ????????? ???? ?????? ????? ??? ??? ????? ??????? ????????????????? ????? ???? (???? ???? ????? ??????? ???? ?????)? ???? ??? ?? ???? ????? ????? ?????? ????? ????? ????? 70-?? ???? ??? ???? ?? 200-?? ???? ????? ???? ????? ????? ??????? ??? ?????????? ?????? ?? ????? ????? ????????? ??????? ????, ??????? ??? ????? ?? ?????????? ????? ???????? ????? ??????? ??? 4 ????? ????? ???? ???-?? ?????????????????? ???? ???? ?????

## 2020-09-07 ENCOUNTER — Other Ambulatory Visit: Payer: Self-pay | Admitting: Internal Medicine

## 2020-09-07 DIAGNOSIS — I10 Essential (primary) hypertension: Secondary | ICD-10-CM

## 2020-09-07 DIAGNOSIS — E039 Hypothyroidism, unspecified: Secondary | ICD-10-CM

## 2020-09-15 ENCOUNTER — Other Ambulatory Visit: Payer: Self-pay | Admitting: Internal Medicine

## 2020-09-15 ENCOUNTER — Telehealth: Payer: Self-pay | Admitting: Endocrinology

## 2020-09-15 NOTE — Telephone Encounter (Signed)
Christy from [facility] [employer] is needing the diagnosis code for patient so she can get her test strips filled   [phone]

## 2020-09-16 DIAGNOSIS — E119 Type 2 diabetes mellitus without complications: Secondary | ICD-10-CM | POA: Diagnosis not present

## 2020-09-16 DIAGNOSIS — H353132 Nonexudative age-related macular degeneration, bilateral, intermediate dry stage: Secondary | ICD-10-CM | POA: Diagnosis not present

## 2020-09-16 DIAGNOSIS — H40013 Open angle with borderline findings, low risk, bilateral: Secondary | ICD-10-CM | POA: Diagnosis not present

## 2020-09-16 NOTE — Telephone Encounter (Signed)
Spoke to [employer] (pharmacy) to verified Dx code for test strips.

## 2020-09-20 ENCOUNTER — Telehealth: Payer: Self-pay | Admitting: Endocrinology

## 2020-09-20 NOTE — Telephone Encounter (Signed)
Pt's sister calling in stating that she would like to talk to the nurse regarding her sisters medications. Would like a call back at [phone]

## 2020-09-22 NOTE — Telephone Encounter (Signed)
Patient has been schedule for virtual appt to address medication questions.

## 2020-09-24 ENCOUNTER — Telehealth (INDEPENDENT_AMBULATORY_CARE_PROVIDER_SITE_OTHER): Payer: Medicare Other | Admitting: Endocrinology

## 2020-09-24 DIAGNOSIS — E1142 Type 2 diabetes mellitus with diabetic polyneuropathy: Secondary | ICD-10-CM | POA: Diagnosis not present

## 2020-09-24 DIAGNOSIS — Z794 Long term (current) use of insulin: Secondary | ICD-10-CM | POA: Diagnosis not present

## 2020-09-24 MED ORDER — ONETOUCH VERIO VI STRP: 1.0000 | ORAL_STRIP | Freq: Two times a day (BID) | 3 refills | Status: DC

## 2020-09-24 MED ORDER — RYBELSUS 7 MG PO TABS: 3.5000 mg | ORAL_TABLET | Freq: Every day | ORAL | 3 refills | Status: DC

## 2020-09-24 NOTE — Progress Notes (Signed)
Subjective:    Patient ID: Barbara White, female    DOB: 04-11-1950, 70 y.o.   MRN: [id number]  HPI telehealth visit today via video visit.  Alternatives to telehealth are presented to this patient, and the patient agrees to the telehealth visit. Pt is advised of the cost of the visit, and agrees to this, also.   Patient is at home, and I am at the office.   Persons attending the telehealth visit: the patient, her son, and I Pt returns for f/u of diabetes mellitus: DM type: Insulin-requiring type 2 Dx'ed: 1998 Complications: PN Therapy: insulin since 2003, and 3 oral meds.   GDM: 1976 DKA: never Severe hypoglycemia: never.  Pancreatitis: never.   Pancreatic imaging: normal on 2017 CT.   Other: she declines multiple daily injections.   Interval history: As of now, she takes levemir (30 qam and 25 units qpm), and synjardy.  She does not take the Rybelsus.  Pt says cbg varies from 110-187.  It is in general higher as the day goes on.   Past Medical History:  Diagnosis Date   Arthritis    Chicken pox 1967   Diabetes mellitus without complication (HCC)    GERD (gastroesophageal reflux disease)    Headache    Hyperlipidemia    Hypertension    Mass of left breast    Polyneuropathy    Thyroid disease     Past Surgical History:  Procedure Laterality Date   ABDOMINAL HYSTERECTOMY  05/2010   APPENDECTOMY  1984   BREAST LUMPECTOMY WITH RADIOACTIVE SEED LOCALIZATION Left 03/16/2017   Procedure: LEFT BREAST LUMPECTOMY WITH RADIOACTIVE SEED LOCALIZATION ERAS PATHWAY;  Surgeon: Claud Kelp, MD;  Location: [facility] OR;  Service: General;  Laterality: Left;   CATARACT EXTRACTION W/ INTRAOCULAR LENS  IMPLANT, BILATERAL     CHOLECYSTECTOMY  2011    Social History   Socioeconomic History   Marital status: Widowed    Spouse name: Not on file   Number of children: Not on file   Years of education: Not on file   Highest education level: Not on file  Occupational History   Not on file   Tobacco Use   Smoking status: Never   Smokeless tobacco: Never  Vaping Use   Vaping Use: Never used  Substance and Sexual Activity   Alcohol use: No   Drug use: No   Sexual activity: Not Currently  Other Topics Concern   Not on file  Social History Narrative   Not on file   Social Determinants of Health   Financial Resource Strain: Not on file  Food Insecurity: Not on file  Transportation Needs: Not on file  Physical Activity: Not on file  Stress: Not on file  Social Connections: Not on file  Intimate Partner Violence: Not on file    Current Outpatient Medications on File Prior to Visit  Medication Sig Dispense Refill   aspirin 81 MG chewable tablet Chew 1 tablet (81 mg total) by mouth daily. 90 tablet 3   chlorhexidine (PERIDEX) 0.12 % solution      Cholecalciferol (VITAMIN D3) 50 MCG (2000 UT) capsule Take 1 capsule (2,000 Units total) by mouth daily. 100 capsule 3   diltiazem (CARDIZEM) 30 MG tablet TAKE ONE TABLET BY MOUTH TWICE A DAY 180 tablet 2   Empagliflozin-metFORMIN HCl (SYNJARDY) 05-998 MG TABS TAKE ONE TABLET BY MOUTH TWICE A DAY 180 tablet 2   famotidine (PEPCID) 40 MG tablet TAKE ONE TABLET BY MOUTH DAILY 90 tablet  3   insulin detemir (LEVEMIR FLEXTOUCH) 100 UNIT/ML FlexPen 30 units each morning and 10 units each evening 45 mL 2   Insulin Pen Needle 31G X 5 MM MISC 1 Units by Does not apply route 4 (four) times daily. 300 each 3   levothyroxine (SYNTHROID) 50 MCG tablet TAKE ONE TABLET BY MOUTH DAILY 90 tablet 2   LORazepam (ATIVAN) 0.5 MG tablet Take 1 tablet (0.5 mg total) by mouth 2 (two) times daily as needed (anxiety or tremor). 60 tablet 2   losartan (COZAAR) 50 MG tablet TAKE ONE TABLET BY MOUTH DAILY 90 tablet 2   simvastatin (ZOCOR) 20 MG tablet TAKE 1 TABLET BY MOUTH EVERY EVENING AT 6 IN THE EVENING 90 tablet 2   vitamin B-12 (CYANOCOBALAMIN) 1000 MCG tablet Take 1 tablet (1,000 mcg total) by mouth daily. 180 tablet 3   No current  facility-administered medications on file prior to visit.    Allergies  Allergen Reactions   Penicillins Swelling and Other (See Comments)    Pt collapsed 1972-  Fasting month ?vaso-vagal after blood was taken. No rash Has patient had a PCN reaction causing immediate rash, facial/tongue/throat swelling, SOB or lightheadedness with hypotension:  Has patient had a PCN reaction causing severe rash involving mucus membranes or skin necrosis:  Has patient had a PCN reaction that required hospitalization:  Has patient had a PCN reaction occurring within the last 10 years:  If all of the above answers are "NO", then may proceed with Cephalospor   Sulfa Antibiotics Itching    Family History  Problem Relation Age of Onset   Stroke Father    Diabetes Father    Heart disease Father 84       with MI   Hypertension Father    Hyperlipidemia Father     There were no vitals taken for this visit.   Review of Systems Denies n/v.  GERD is well-controlled    Objective:   Physical Exam   Lab Results  Component Value Date   HGBA1C 7.6 (A) 08/11/2020       Assessment & Plan:  Insulin-requiring type 2 DM: uncontrolled.  To safely improve glycemic control, we'll try to deemphasize insulin.  Patient Instructions  check your blood sugar twice a day.  vary the time of day when you check, between before the 3 meals, and at bedtime.  also check if you have symptoms of your blood sugar being too high or too low.  please keep a record of the readings and bring it to your next appointment here (or you can bring the meter itself).  You can write it on any piece of paper.  please call us sooner if your blood sugar goes below 70, or if you have a lot of readings over 200.   We will need to take this complex situation in stages For now, please reduce the insulin to 25 AM and 10PM, and start rybelsus 1/2 of 7 mg qam.   Please continue the same synjardy.   Please come back for a follow-up appointment in 6  weeks.

## 2020-09-24 NOTE — Patient Instructions (Addendum)
check your blood sugar twice a day.  vary the time of day when you check, between before the 3 meals, and at bedtime.  also check if you have symptoms of your blood sugar being too high or too low.  please keep a record of the readings and bring it to your next appointment here (or you can bring the meter itself).  You can write it on any piece of paper.  please call us sooner if your blood sugar goes below 70, or if you have a lot of readings over 200.   We will need to take this complex situation in stages For now, please reduce the insulin to 25 AM and 10PM, and start rybelsus 1/2 of 7 mg qam.   Please continue the same synjardy.   Please come back for a follow-up appointment in 6 weeks.

## 2020-11-11 ENCOUNTER — Ambulatory Visit: Payer: Medicare Other | Admitting: Internal Medicine

## 2020-11-16 ENCOUNTER — Telehealth (INDEPENDENT_AMBULATORY_CARE_PROVIDER_SITE_OTHER): Payer: Medicare Other | Admitting: Internal Medicine

## 2020-11-16 DIAGNOSIS — I10 Essential (primary) hypertension: Secondary | ICD-10-CM | POA: Diagnosis not present

## 2020-11-16 DIAGNOSIS — E1142 Type 2 diabetes mellitus with diabetic polyneuropathy: Secondary | ICD-10-CM | POA: Diagnosis not present

## 2020-11-16 DIAGNOSIS — Z794 Long term (current) use of insulin: Secondary | ICD-10-CM | POA: Diagnosis not present

## 2020-11-16 NOTE — Progress Notes (Signed)
Virtual Visit via Video Note  I connected with Barbara White on 11/21/20 at  2:40 PM EDT by a video enabled telemedicine application and verified that I am speaking with the correct person using two identifiers.   I discussed the limitations of evaluation and management by telemedicine and the availability of in person appointments. The patient expressed understanding and agreed to proceed.  I was located at our [facility] office. The patient was at home. There was her son present in the visit.  No chief complaint on file.    History of Present Illness:  Follow-up on diabetes, hypertension  Review of Systems  Constitutional:  Negative for weight loss.  Respiratory:  Negative for cough.   Cardiovascular:  Negative for chest pain and palpitations.  Gastrointestinal:  Negative for abdominal pain.    Observations/Objective: The patient appears to be in no acute distress  Assessment and Plan:  Problem List Items Addressed This Visit     DM (diabetes mellitus) (HCC)    Continue  Levemir, Synjardy and Rybelsus Titrate down Levemir if blood sugars get below 100      HTN (hypertension), benign    Continue with diltiazem, losartan        No orders of the defined types were placed in this encounter.    Follow Up Instructions:    I discussed the assessment and treatment plan with the patient. The patient was provided an opportunity to ask questions and all were answered. The patient agreed with the plan and demonstrated an understanding of the instructions.   The patient was advised to call back or seek an in-person evaluation if the symptoms worsen or if the condition fails to improve as anticipated.  I provided face-to-face time during this encounter. We were at different locations.   Sonda Primes, MD

## 2020-11-21 ENCOUNTER — Encounter: Payer: Self-pay | Admitting: Internal Medicine

## 2020-11-21 NOTE — Assessment & Plan Note (Signed)
Continue  Levemir, Synjardy and Rybelsus Titrate down Levemir if blood sugars get below 100

## 2020-11-21 NOTE — Assessment & Plan Note (Signed)
Continue with diltiazem, losartan

## 2020-12-13 ENCOUNTER — Telehealth: Payer: Medicare Other | Admitting: Endocrinology

## 2020-12-13 ENCOUNTER — Ambulatory Visit: Payer: Medicare Other | Admitting: Endocrinology

## 2020-12-13 ENCOUNTER — Other Ambulatory Visit: Payer: Self-pay

## 2020-12-13 ENCOUNTER — Telehealth (INDEPENDENT_AMBULATORY_CARE_PROVIDER_SITE_OTHER): Payer: Medicare Other | Admitting: Endocrinology

## 2020-12-13 DIAGNOSIS — Z794 Long term (current) use of insulin: Secondary | ICD-10-CM

## 2020-12-13 DIAGNOSIS — E1142 Type 2 diabetes mellitus with diabetic polyneuropathy: Secondary | ICD-10-CM

## 2020-12-13 MED ORDER — RYBELSUS 7 MG PO TABS: 7.0000 mg | ORAL_TABLET | Freq: Every day | ORAL | 3 refills | Status: DC

## 2020-12-13 MED ORDER — LEVEMIR FLEXTOUCH 100 UNIT/ML ~~LOC~~ SOPN: 30.0000 [IU] | PEN_INJECTOR | SUBCUTANEOUS | 3 refills | Status: DC

## 2020-12-13 MED ORDER — LEVEMIR FLEXTOUCH 100 UNIT/ML ~~LOC~~ SOPN: 30.0000 [IU] | PEN_INJECTOR | Freq: Every morning | SUBCUTANEOUS | 3 refills | Status: DC

## 2020-12-13 NOTE — Progress Notes (Signed)
Subjective:    Patient ID: Barbara White, female    DOB: 05/28/1950, 70 y.o.   MRN: [id number]  HPI telehealth visit today via video visit.  Alternatives to telehealth are presented to this patient, and the patient agrees to the telehealth visit. Pt is advised of the cost of the visit, and agrees to this, also.   Patient is at relatives' home, in [place], and I am at the office.   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   GDM: 1976 DKA: never Severe hypoglycemia: never.  Pancreatitis: never.   Pancreatic imaging: normal on 2017 CT.   Other: she declines multiple daily injections.   Interval history: As of now, she takes levemir (30AM, 10PM), and synjardy.  She does not take the Rybelsus.  Pt says cbg varies from 98-145.  It is in general higher as the day goes on.   Past Medical History:  Diagnosis Date   Arthritis    Chicken pox 1967   Diabetes mellitus without complication (HCC)    GERD (gastroesophageal reflux disease)    Headache    Hyperlipidemia    Hypertension    Mass of left breast    Polyneuropathy    Thyroid disease     Past Surgical History:  Procedure Laterality Date   ABDOMINAL HYSTERECTOMY  05/2010   APPENDECTOMY  1984   BREAST LUMPECTOMY WITH RADIOACTIVE SEED LOCALIZATION Left 03/16/2017   Procedure: LEFT BREAST LUMPECTOMY WITH RADIOACTIVE SEED LOCALIZATION ERAS PATHWAY;  Surgeon: Claud Kelp, MD;  Location: [facility] OR;  Service: General;  Laterality: Left;   CATARACT EXTRACTION W/ INTRAOCULAR LENS  IMPLANT, BILATERAL     CHOLECYSTECTOMY  2011    Social History   Socioeconomic History   Marital status: Widowed    Spouse name: Not on file   Number of children: Not on file   Years of education: Not on file   Highest education level: Not on file  Occupational History   Not  on file  Tobacco Use   Smoking status: Never   Smokeless tobacco: Never  Vaping Use   Vaping Use: Never used  Substance and Sexual Activity   Alcohol use: No   Drug use: No   Sexual activity: Not Currently  Other Topics Concern   Not on file  Social History Narrative   Not on file   Social Determinants of Health   Financial Resource Strain: Not on file  Food Insecurity: Not on file  Transportation Needs: Not on file  Physical Activity: Not on file  Stress: Not on file  Social Connections: Not on file  Intimate Partner Violence: Not on file    Current Outpatient Medications on File Prior to Visit  Medication Sig Dispense Refill   aspirin 81 MG chewable tablet Chew 1 tablet (81 mg total) by mouth daily. 90 tablet 3   chlorhexidine (PERIDEX) 0.12 % solution      Cholecalciferol (VITAMIN D3) 50 MCG (2000 UT) capsule Take 1 capsule (2,000 Units total) by mouth daily. 100 capsule 3   diltiazem (CARDIZEM) 30 MG tablet TAKE ONE TABLET BY MOUTH TWICE A DAY 180 tablet 2   Empagliflozin-metFORMIN HCl (SYNJARDY) 05-998 MG TABS TAKE ONE TABLET BY MOUTH TWICE A DAY 180 tablet 2   famotidine (PEPCID) 40 MG tablet TAKE ONE TABLET BY MOUTH DAILY 90 tablet 3  glucose blood (ONETOUCH VERIO) test strip 1 each by Other route 2 (two) times daily. And lancets 2/day 200 each 3   Insulin Pen Needle 31G X 5 MM MISC 1 Units by Does not apply route 4 (four) times daily. 300 each 3   levothyroxine (SYNTHROID) 50 MCG tablet TAKE ONE TABLET BY MOUTH DAILY 90 tablet 2   LORazepam (ATIVAN) 0.5 MG tablet Take 1 tablet (0.5 mg total) by mouth 2 (two) times daily as needed (anxiety or tremor). 60 tablet 2   losartan (COZAAR) 50 MG tablet TAKE ONE TABLET BY MOUTH DAILY 90 tablet 2   simvastatin (ZOCOR) 20 MG tablet TAKE 1 TABLET BY MOUTH EVERY EVENING AT 6 IN THE EVENING 90 tablet 2   No current facility-administered medications on file prior to visit.     Family History  Problem Relation Age of Onset    Stroke Father    Diabetes Father    Heart disease Father 63       with MI   Hypertension Father    Hyperlipidemia Father     Ht 5\' 2"  (1.575 m)   Wt 139 lb (63 kg)   BMI 25.42 kg/m    Review of Systems She denies hypoglycemia.  Nausea is resolved.      Objective:   Physical Exam       Assessment & Plan:  Insulin-requiring type 2 DM: uncontrolled  Patient Instructions  check your blood sugar twice a day.  vary the time of day when you check, between before the 3 meals, and at bedtime.  also check if you have symptoms of your blood sugar being too high or too low.  please keep a record of the readings and bring it to your next appointment here (or you can bring the meter itself).  You can write it on any piece of paper.  please call sooner if your blood sugar goes below 70, or if you have a lot of readings over 200.    We will need to take this complex situation in stages For now, please reduce the insulin to 30 AM and none in the evening.      Please continue the same Synjardy.  I have sent a prescription to your pharmacy, to increase the Rybelsus to 1 pill per day.    Please come back for a follow-up appointment in 2-3 months.

## 2020-12-13 NOTE — Patient Instructions (Addendum)
check your blood sugar twice a day.  vary the time of day when you check, between before the 3 meals, and at bedtime.  also check if you have symptoms of your blood sugar being too high or too low.  please keep a record of the readings and bring it to your next appointment here (or you can bring the meter itself).  You can write it on any piece of paper.  please call us sooner if your blood sugar goes below 70, or if you have a lot of readings over 200.    We will need to take this complex situation in stages For now, please reduce the insulin to 30 AM and none in the evening.      Please continue the same Synjardy.  I have sent a prescription to your pharmacy, to increase the Rybelsus to 1 pill per day.    Please come back for a follow-up appointment in 2-3 months.

## 2021-01-29 DIAGNOSIS — Z23 Encounter for immunization: Secondary | ICD-10-CM | POA: Diagnosis not present

## 2021-02-07 ENCOUNTER — Encounter: Payer: Self-pay | Admitting: Internal Medicine

## 2021-02-07 ENCOUNTER — Ambulatory Visit (INDEPENDENT_AMBULATORY_CARE_PROVIDER_SITE_OTHER): Payer: Medicare Other | Admitting: Internal Medicine

## 2021-02-07 ENCOUNTER — Other Ambulatory Visit: Payer: Self-pay

## 2021-02-07 DIAGNOSIS — K660 Peritoneal adhesions (postprocedural) (postinfection): Secondary | ICD-10-CM | POA: Diagnosis not present

## 2021-02-07 DIAGNOSIS — Z794 Long term (current) use of insulin: Secondary | ICD-10-CM

## 2021-02-07 DIAGNOSIS — E538 Deficiency of other specified B group vitamins: Secondary | ICD-10-CM

## 2021-02-07 DIAGNOSIS — K5790 Diverticulosis of intestine, part unspecified, without perforation or abscess without bleeding: Secondary | ICD-10-CM | POA: Diagnosis not present

## 2021-02-07 DIAGNOSIS — E1142 Type 2 diabetes mellitus with diabetic polyneuropathy: Secondary | ICD-10-CM

## 2021-02-07 DIAGNOSIS — I10 Essential (primary) hypertension: Secondary | ICD-10-CM | POA: Diagnosis not present

## 2021-02-07 DIAGNOSIS — E039 Hypothyroidism, unspecified: Secondary | ICD-10-CM

## 2021-02-07 LAB — LIPID PANEL
Cholesterol: 127 mg/dL (ref 0–200)
HDL: 32.6 mg/dL — ABNORMAL LOW (ref >39.00)
LDL Cholesterol: 62 mg/dL (ref 0–99)
NonHDL: 94.18
Total CHOL/HDL Ratio: 4
Triglycerides: 159 mg/dL — ABNORMAL HIGH (ref 0.0–149.0)
VLDL: 31.8 mg/dL (ref 0.0–40.0)

## 2021-02-07 LAB — URINALYSIS
Bilirubin Urine: NEGATIVE
Hgb urine dipstick: NEGATIVE
Leukocytes,Ua: NEGATIVE
Nitrite: NEGATIVE
Specific Gravity, Urine: 1.01 (ref 1.000–1.030)
Total Protein, Urine: NEGATIVE
Urine Glucose: >=1000 — AB
Urobilinogen, UA: 0.2 (ref 0.0–1.0)
pH: 5.5 (ref 5.0–8.0)

## 2021-02-07 LAB — COMPREHENSIVE METABOLIC PANEL
ALT: 9 U/L (ref 0–35)
AST: 15 U/L (ref 0–37)
Albumin: 4.4 g/dL (ref 3.5–5.2)
Alkaline Phosphatase: 50 U/L (ref 39–117)
BUN: 16 mg/dL (ref 6–23)
CO2: 29 mEq/L (ref 19–32)
Calcium: 9.7 mg/dL (ref 8.4–10.5)
Chloride: 97 mEq/L (ref 96–112)
Creatinine, Ser: 1.1 mg/dL (ref 0.40–1.20)
GFR: 50.9 mL/min — ABNORMAL LOW (ref >60.00)
Glucose, Bld: 111 mg/dL — ABNORMAL HIGH (ref 70–99)
Potassium: 4.2 mEq/L (ref 3.5–5.1)
Sodium: 134 mEq/L — ABNORMAL LOW (ref 135–145)
Total Bilirubin: 0.7 mg/dL (ref 0.2–1.2)
Total Protein: 7.8 g/dL (ref 6.0–8.3)

## 2021-02-07 LAB — TSH: TSH: 1.51 u[IU]/mL (ref 0.35–5.50)

## 2021-02-07 LAB — HEMOGLOBIN A1C: Hgb A1c MFr Bld: 7.6 % — ABNORMAL HIGH (ref 4.6–6.5)

## 2021-02-07 MED ORDER — LEVOTHYROXINE SODIUM 50 MCG PO TABS: 50.0000 ug | ORAL_TABLET | Freq: Every day | ORAL | 3 refills | Status: DC

## 2021-02-07 MED ORDER — DILTIAZEM HCL 30 MG PO TABS: 30.0000 mg | ORAL_TABLET | Freq: Two times a day (BID) | ORAL | 3 refills | Status: DC

## 2021-02-07 MED ORDER — INSULIN PEN NEEDLE 31G X 5 MM MISC: 1.0000 [IU] | Freq: Four times a day (QID) | 3 refills | Status: DC

## 2021-02-07 MED ORDER — ONETOUCH VERIO VI STRP: 1.0000 | ORAL_STRIP | Freq: Two times a day (BID) | 3 refills | Status: DC

## 2021-02-07 MED ORDER — SYNJARDY 5-1000 MG PO TABS: 1.0000 | ORAL_TABLET | Freq: Two times a day (BID) | ORAL | 3 refills | Status: DC

## 2021-02-07 MED ORDER — SIMVASTATIN 20 MG PO TABS: ORAL_TABLET | ORAL | 3 refills | Status: DC

## 2021-02-07 MED ORDER — LOSARTAN POTASSIUM 50 MG PO TABS: 50.0000 mg | ORAL_TABLET | Freq: Every day | ORAL | 3 refills | Status: DC

## 2021-02-07 MED ORDER — FAMOTIDINE 40 MG PO TABS: 40.0000 mg | ORAL_TABLET | Freq: Every day | ORAL | 3 refills | Status: DC

## 2021-02-07 MED ORDER — CIPROFLOXACIN HCL 500 MG PO TABS: 500.0000 mg | ORAL_TABLET | Freq: Two times a day (BID) | ORAL | 0 refills | Status: AC

## 2021-02-07 MED ORDER — INSULIN PEN NEEDLE 31G X 5 MM MISC: 1.0000 [IU] | Freq: Four times a day (QID) | 3 refills | Status: AC

## 2021-02-07 MED ORDER — LEVEMIR FLEXTOUCH 100 UNIT/ML ~~LOC~~ SOPN: 30.0000 [IU] | PEN_INJECTOR | SUBCUTANEOUS | 3 refills | Status: DC

## 2021-02-07 MED ORDER — RYBELSUS 7 MG PO TABS
7.0000 mg | ORAL_TABLET | Freq: Every day | ORAL | 3 refills | Status: DC
Start: 2021-02-07 — End: 2022-06-01

## 2021-02-07 NOTE — Assessment & Plan Note (Signed)
She had EXPLORATORY LAPAROTOMY, SMALL BOWEL RESECTION X 1 by Dr. Eustace Quail on 07/19/2019 at [facility].  Discussed

## 2021-02-07 NOTE — Assessment & Plan Note (Addendum)
BP Readings from Last 3 Encounters:  02/07/21 118/66  08/11/20 (!) 110/56  07/12/20 120/60   Better w/Wt loss

## 2021-02-07 NOTE — Assessment & Plan Note (Signed)
W/possible infection Cipro 500 po bid for travel

## 2021-02-07 NOTE — Patient Instructions (Addendum)
She had EXPLORATORY LAPAROTOMY, SMALL BOWEL RESECTION X 1 by Dr. Eustace Quail on 6/26      Diverticulosis Diverticulosis is a condition that develops when small pouches (diverticula) form in the wall of the large intestine (colon). The colon is where water is absorbed and stool (feces) is formed. The pouches form when the inside layer of the colon pushes through weak spots in the outer layers of the colon. You may have a few pouches or many of them. The pouches usually do not cause problems unless they become inflamed or infected. When this happens, the condition is called diverticulitis. What are the causes? The cause of this condition is not known. What increases the risk? The following factors may make you more likely to develop this condition: Being older than age 73. Your risk for this condition increases with age. Diverticulosis is rare among people younger than age 75. By age 42, many people have it. Eating a low-fiber diet. Having frequent constipation. Being overweight. Not getting enough exercise. Smoking. Taking over-the-counter pain medicines, like aspirin and ibuprofen. Having a family history of diverticulosis. What are the signs or symptoms? In most people, there are no symptoms of this condition. If you do have symptoms, they may include: Bloating. Cramps in the abdomen. Constipation or diarrhea. Pain in the lower left side of the abdomen. How is this diagnosed? Because diverticulosis usually has no symptoms, it is most often diagnosed during an exam for other colon problems. The condition may be diagnosed by: Using a flexible scope to examine the colon (colonoscopy). Taking an X-ray of the colon after dye has been put into the colon (barium enema). Having a CT scan. How is this treated? You may not need treatment for this condition. Your health care provider may recommend treatment to prevent problems. You may need treatment if you have symptoms or if you previously had  diverticulitis. Treatment may include: Eating a high-fiber diet. Taking a fiber supplement. Taking a live bacteria supplement (probiotic). Taking medicine to relax your colon. Follow these instructions at home: Medicines Take over-the-counter and prescription medicines only as told by your health care provider. If told by your health care provider, take a fiber supplement or probiotic. Constipation prevention Your condition may cause constipation. To prevent or treat constipation, you may need to: Drink enough fluid to keep your urine pale yellow. Take over-the-counter or prescription medicines. Eat foods that are high in fiber, such as beans, whole grains, and fresh fruits and vegetables. Limit foods that are high in fat and processed sugars, such as fried or sweet foods.  General instructions Try not to strain when you have a bowel movement. Keep all follow-up visits as told by your health care provider. This is important. Contact a health care provider if you: Have pain in your abdomen. Have bloating. Have cramps. Have not had a bowel movement in 3 days. Get help right away if: Your pain gets worse. Your bloating becomes very bad. You have a fever or chills, and your symptoms suddenly get worse. You vomit. You have bowel movements that are bloody or black. You have bleeding from your rectum. Summary Diverticulosis is a condition that develops when small pouches (diverticula) form in the wall of the large intestine (colon). You may have a few pouches or many of them. This condition is most often diagnosed during an exam for other colon problems. Treatment may include increasing the fiber in your diet, taking supplements, or taking medicines. This information is not intended to replace  advice given to you by your health care provider. Make sure you discuss any questions you have with your health care provider. Document Revised: 08/08/2018 Document Reviewed: 08/08/2018 Elsevier  Patient Education  2022 Elsevier Inc. /2021 at [facility]

## 2021-02-07 NOTE — Assessment & Plan Note (Signed)
On Vit B12 

## 2021-02-07 NOTE — Assessment & Plan Note (Addendum)
Check A1c On Levemir, Synjardy and Rybelsus Titrate down Levemir if blood sugars get below 100

## 2021-02-15 ENCOUNTER — Other Ambulatory Visit: Payer: Self-pay | Admitting: *Deleted

## 2021-02-15 MED ORDER — ONETOUCH VERIO VI STRP: 1.0000 | ORAL_STRIP | Freq: Two times a day (BID) | 3 refills | Status: DC

## 2021-02-28 NOTE — Progress Notes (Signed)
Subjective:  Patient ID: Barbara White, female    DOB: 07-04-1950  Age: 71 y.o. MRN: [id number]   CC: Abdominal Pain (Daughter states she been having pain on the (R) side 4-6 weeks)     HPI Sheema Heery presents for DM, HTN,  Pt had abd surgery in [place] in June 2021 for  - She had EXPLORATORY LAPAROTOMY, SMALL BOWEL RESECTION X 1 by Dr. Dellis Anes on 07/19/2019 at [facility].          Outpatient Medications Prior to Visit  Medication Sig Dispense Refill   aspirin 81 MG chewable tablet Chew 1 tablet (81 mg total) by mouth daily. 90 tablet 3   chlorhexidine (PERIDEX) 0.12 % solution         Cholecalciferol (VITAMIN D3) 50 MCG (2000 UT) capsule Take 1 capsule (2,000 Units total) by mouth daily. 100 capsule 3   LORazepam (ATIVAN) 0.5 MG tablet Take 1 tablet (0.5 mg total) by mouth 2 (two) times daily as needed (anxiety or tremor). 60 tablet 2   diltiazem (CARDIZEM) 30 MG tablet TAKE ONE TABLET BY MOUTH TWICE A DAY 180 tablet 2   Empagliflozin-metFORMIN HCl (SYNJARDY) 05-998 MG TABS TAKE ONE TABLET BY MOUTH TWICE A DAY 180 tablet 2   famotidine (PEPCID) 40 MG tablet TAKE ONE TABLET BY MOUTH DAILY 90 tablet 3   glucose blood (ONETOUCH VERIO) test strip 1 each by Other route 2 (two) times daily. And lancets 2/day 200 each 3   insulin detemir (LEVEMIR FLEXTOUCH) 100 UNIT/ML FlexPen Inject 30 Units into the skin every morning. 30 mL 3   Insulin Pen Needle 31G X 5 MM MISC 1 Units by Does not apply route 4 (four) times daily. 300 each 3   levothyroxine (SYNTHROID) 50 MCG tablet TAKE ONE TABLET BY MOUTH DAILY 90 tablet 2   losartan (COZAAR) 50 MG tablet TAKE ONE TABLET BY MOUTH DAILY 90 tablet 2   Semaglutide (RYBELSUS) 7 MG TABS Take 7 mg by mouth daily. 90 tablet 3   simvastatin (ZOCOR) 20 MG tablet TAKE 1 TABLET BY MOUTH EVERY EVENING AT 6 IN THE EVENING 90 tablet 2    No facility-administered medications prior to visit.      ROS: Review of Systems  Constitutional:  Negative for activity change,  appetite change, chills, fatigue and unexpected weight change.  HENT:  Negative for congestion, mouth sores and sinus pressure.   Eyes:  Negative for visual disturbance.  Respiratory:  Negative for cough and chest tightness.   Gastrointestinal:  Negative for abdominal pain and nausea.  Genitourinary:  Negative for difficulty urinating, frequency and vaginal pain.  Musculoskeletal:  Positive for arthralgias. Negative for back pain and gait problem.  Skin:  Negative for pallor and rash.  Neurological:  Negative for dizziness, tremors, weakness, numbness and headaches.  Psychiatric/Behavioral:  Negative for confusion and sleep disturbance. The patient is not nervous/anxious.     Objective:  BP 118/66 (BP Location: Left Arm)    Pulse 78    Temp 98 F (36.7 C) (Oral)    Ht 5\' 2"  (1.575 m)    Wt 126 lb 12.8 oz (57.5 kg)    SpO2 96%    BMI 23.19 kg/m       BP Readings from Last 3 Encounters:  02/07/21 118/66  08/11/20 (!) 110/56  07/12/20 120/60         Wt Readings from Last 3 Encounters:  02/07/21 126 lb 12.8 oz (57.5 kg)  12/13/20 139 lb (63 kg)  08/11/20 140 lb (63.5 kg)      Physical Exam Constitutional:      General: She is not in acute distress.    Appearance: She is well-developed.  HENT:     Head: Normocephalic.     Right Ear: External ear normal.     Left Ear: External ear normal.     Nose: Nose normal.  Eyes:     General:        Right eye: No discharge.        Left eye: No discharge.     Conjunctiva/sclera: Conjunctivae normal.     Pupils: Pupils are equal, round, and reactive to light.  Neck:     Thyroid: No thyromegaly.     Vascular: No JVD.     Trachea: No tracheal deviation.  Cardiovascular:     Rate and Rhythm: Normal rate and regular rhythm.     Heart sounds: Normal heart sounds.  Pulmonary:     Effort: No respiratory distress.     Breath sounds: No stridor. No wheezing.  Abdominal:     General: Bowel sounds are normal. There is no distension.      Palpations: Abdomen is soft. There is no mass.     Tenderness: There is no abdominal tenderness. There is no guarding or rebound.  Musculoskeletal:        General: No tenderness.     Cervical back: Normal range of motion and neck supple. No rigidity.  Lymphadenopathy:     Cervical: No cervical adenopathy.  Skin:    Findings: No erythema or rash.  Neurological:     Cranial Nerves: No cranial nerve deficit.     Motor: No abnormal muscle tone.     Coordination: Coordination normal.     Deep Tendon Reflexes: Reflexes normal.  Psychiatric:        Behavior: Behavior normal.        Thought Content: Thought content normal.        Judgment: Judgment normal.       A total time of 46 minutes was spent preparing to see the patient, reviewing tests, x-rays, operative reports and other medical records.  Also, obtaining history and performing comprehensive physical exam.  Additionally, counseling the patient regarding the above listed issues.   Finally, documenting clinical information in the health records, coordination of care, educating the patient. It is a complex case.   Recent Labs       Lab Results  Component Value Date    WBC 5.4 11/21/2019    HGB 11.8 (L) 11/21/2019    HCT 37.0 11/21/2019    PLT 255.0 11/21/2019    GLUCOSE 110 (H) 07/12/2020    CHOL 115 07/12/2020    TRIG 161.0 (H) 07/12/2020    HDL 30.80 (L) 07/12/2020    LDLDIRECT 86.0 04/29/2019    LDLCALC 52 07/12/2020    ALT 13 07/12/2020    AST 16 07/12/2020    NA 138 07/12/2020    K 4.0 07/12/2020    CL 103 07/12/2020    CREATININE 1.14 07/12/2020    BUN 22 07/12/2020    CO2 27 07/12/2020    TSH 2.25 07/12/2020    HGBA1C 7.6 (A) 08/11/2020    MICROALBUR <0.7 07/12/2020        No results found.   Assessment & Plan:    Problem List Items Addressed This Visit       Abdominal adhesions      She had EXPLORATORY LAPAROTOMY, SMALL  BOWEL RESECTION X 1 by Dr. Eustace Quail on 07/19/2019 at [facility].  Discussed        B12  deficiency      On Vit B12        Diverticulosis      W/possible infection Cipro 500 po bid for travel        DM (diabetes mellitus) (HCC)      Check A1c On Levemir, Synjardy and Rybelsus Titrate down Levemir if blood sugars get below 100        Relevant Medications    Semaglutide (RYBELSUS) 7 MG TABS    Empagliflozin-metFORMIN HCl (SYNJARDY) 05-998 MG TABS    losartan (COZAAR) 50 MG tablet    simvastatin (ZOCOR) 20 MG tablet    insulin detemir (LEVEMIR FLEXTOUCH) 100 UNIT/ML FlexPen    Other Relevant Orders    Comprehensive metabolic panel    TSH    Hemoglobin A1c    Urinalysis    Lipid panel    HTN (hypertension), benign         BP Readings from Last 3 Encounters:  02/07/21 118/66  08/11/20 (!) 110/56  07/12/20 120/60  Better w/Wt loss        Relevant Medications    diltiazem (CARDIZEM) 30 MG tablet    losartan (COZAAR) 50 MG tablet    simvastatin (ZOCOR) 20 MG tablet    Other Relevant Orders    Comprehensive metabolic panel    TSH    Hemoglobin A1c    Urinalysis    Lipid panel    Hypothyroidism    Relevant Medications    levothyroxine (SYNTHROID) 50 MCG tablet            Meds ordered this encounter  Medications   Semaglutide (RYBELSUS) 7 MG TABS      Sig: Take 7 mg by mouth daily.      Dispense:  90 tablet      Refill:  3   diltiazem (CARDIZEM) 30 MG tablet      Sig: Take 1 tablet (30 mg total) by mouth 2 (two) times daily.      Dispense:  180 tablet      Refill:  3   Empagliflozin-metFORMIN HCl (SYNJARDY) 05-998 MG TABS      Sig: Take 1 tablet by mouth 2 (two) times daily.      Dispense:  180 tablet      Refill:  3   famotidine (PEPCID) 40 MG tablet      Sig: Take 1 tablet (40 mg total) by mouth daily.      Dispense:  90 tablet      Refill:  3   levothyroxine (SYNTHROID) 50 MCG tablet      Sig: Take 1 tablet (50 mcg total) by mouth daily.      Dispense:  90 tablet      Refill:  3   losartan (COZAAR) 50 MG tablet      Sig: Take 1 tablet  (50 mg total) by mouth daily.      Dispense:  90 tablet      Refill:  3   simvastatin (ZOCOR) 20 MG tablet      Sig: TAKE 1 TABLET BY MOUTH EVERY EVENING AT 6 IN THE EVENING      Dispense:  90 tablet      Refill:  3   insulin detemir (LEVEMIR FLEXTOUCH) 100 UNIT/ML FlexPen      Sig: Inject 30 Units into the skin every morning.  Dispense:  30 mL      Refill:  3   Insulin Pen Needle 31G X 5 MM MISC      Sig: 1 Units by Does not apply route 4 (four) times daily.      Dispense:  300 each      Refill:  3   glucose blood (ONETOUCH VERIO) test strip      Sig: 1 each by Other route 2 (two) times daily. And lancets 2/day      Dispense:  200 each      Refill:  3   ciprofloxacin (CIPRO) 500 MG tablet      Sig: Take 1 tablet (500 mg total) by mouth 2 (two) times daily for 10 days.      Dispense:  20 tablet      Refill:  0        Follow-up: No follow-ups on file.   Walker Kehr, MD

## 2021-12-14 ENCOUNTER — Encounter: Payer: Self-pay | Admitting: Internal Medicine

## 2021-12-14 ENCOUNTER — Telehealth: Payer: Self-pay | Admitting: Internal Medicine

## 2021-12-14 ENCOUNTER — Telehealth (INDEPENDENT_AMBULATORY_CARE_PROVIDER_SITE_OTHER): Payer: Medicare Other | Admitting: Internal Medicine

## 2021-12-14 DIAGNOSIS — K219 Gastro-esophageal reflux disease without esophagitis: Secondary | ICD-10-CM | POA: Diagnosis not present

## 2021-12-14 DIAGNOSIS — E1142 Type 2 diabetes mellitus with diabetic polyneuropathy: Secondary | ICD-10-CM | POA: Diagnosis not present

## 2021-12-14 DIAGNOSIS — Z794 Long term (current) use of insulin: Secondary | ICD-10-CM | POA: Diagnosis not present

## 2021-12-14 MED ORDER — FAMOTIDINE 40 MG PO TABS: 40.0000 mg | ORAL_TABLET | Freq: Every day | ORAL | 3 refills | Status: AC

## 2021-12-14 MED ORDER — LEVEMIR FLEXTOUCH 100 UNIT/ML ~~LOC~~ SOPN: 30.0000 [IU] | PEN_INJECTOR | SUBCUTANEOUS | 0 refills | Status: DC

## 2021-12-14 MED ORDER — LEVEMIR FLEXTOUCH 100 UNIT/ML ~~LOC~~ SOPN: 30.0000 [IU] | PEN_INJECTOR | SUBCUTANEOUS | 3 refills | Status: DC

## 2021-12-14 NOTE — Assessment & Plan Note (Signed)
Doing well.  Continue with famotidine 40 mg daily

## 2021-12-14 NOTE — Progress Notes (Signed)
Virtual Visit via Video Note  I connected with Barbara White on 12/14/21 at  2:00 PM EST by a video enabled telemedicine application and verified that I am speaking with the correct person using two identifiers.   I discussed the limitations of evaluation and management by telemedicine and the availability of in person appointments. The patient expressed understanding and agreed to proceed.  I was located at our [facility] office. The patient was at home. Her son was present in the visit.  No chief complaint on file.    History of Present Illness:  Follow-up on diabetes and GERD.  Doing well.  CBGs are in low 100s Review of Systems  Constitutional:  Negative for malaise/fatigue.  Respiratory:  Negative for shortness of breath.   Cardiovascular:  Negative for chest pain and leg swelling.  Musculoskeletal:  Negative for back pain.     Observations/Objective: The patient appears to be in no acute distress  Assessment and Plan:  Problem List Items Addressed This Visit     GERD (gastroesophageal reflux disease)    Doing well.  Continue with famotidine 40 mg daily      Relevant Medications   famotidine (PEPCID) 40 MG tablet   DM (diabetes mellitus) (HCC) - Primary    Doing well.  We renewed Levemir      Relevant Medications   insulin detemir (LEVEMIR FLEXTOUCH) 100 UNIT/ML FlexPen     Meds ordered this encounter  Medications   famotidine (PEPCID) 40 MG tablet    Sig: Take 1 tablet (40 mg total) by mouth daily.    Dispense:  90 tablet    Refill:  3   insulin detemir (LEVEMIR FLEXTOUCH) 100 UNIT/ML FlexPen    Sig: Inject 30 Units into the skin every morning. Follow-up appt is due must see MD for future refills    Dispense:  30 mL    Refill:  3     Follow Up Instructions:    I discussed the assessment and treatment plan with the patient. The patient was provided an opportunity to ask questions and all were answered. The patient agreed with the plan and  demonstrated an understanding of the instructions.   The patient was advised to call back or seek an in-person evaluation if the symptoms worsen or if the condition fails to improve as anticipated.  I provided face-to-face time during this encounter. We were at different locations.   Sonda Primes, MD

## 2021-12-14 NOTE — Assessment & Plan Note (Signed)
Doing well.  We renewed Levemir

## 2021-12-14 NOTE — Telephone Encounter (Signed)
Caller: [facility]  Call Back Number:  Date of Last Office Visit: 02/07/21  Date of Next Office Visit: TODAY 12/14/21  Medication(s) to be Refilled:   Tommye Standard  Last Written: 02/07/21   [facility]

## 2021-12-14 NOTE — Telephone Encounter (Signed)
Rx sent to [facility].Marland KitchenRaechel Chute

## 2021-12-17 DIAGNOSIS — Z23 Encounter for immunization: Secondary | ICD-10-CM | POA: Diagnosis not present

## 2022-01-20 ENCOUNTER — Telehealth: Payer: Self-pay | Admitting: Internal Medicine

## 2022-01-20 NOTE — Telephone Encounter (Signed)
Left message for patient to call back and schedule the Medicare Annual Wellness Visit (AWV) virtually or by telephone.  Last AWV 07/23/16  Please schedule at anytime with [facility] Advisor.  45 minute appointment  Any questions, please call me at [phone]

## 2022-05-31 ENCOUNTER — Other Ambulatory Visit: Payer: Self-pay | Admitting: Internal Medicine

## 2022-05-31 DIAGNOSIS — E039 Hypothyroidism, unspecified: Secondary | ICD-10-CM

## 2022-05-31 DIAGNOSIS — I10 Essential (primary) hypertension: Secondary | ICD-10-CM

## 2022-06-01 ENCOUNTER — Telehealth: Payer: Self-pay | Admitting: Internal Medicine

## 2022-06-01 ENCOUNTER — Telehealth: Payer: Self-pay | Admitting: *Deleted

## 2022-06-01 NOTE — Telephone Encounter (Signed)
Rec'd call from pharmacist she wanting to get diagnosis code on her testing strips. Gave her diabetes Dx code E11.42.Marland KitchenRaechel Chute

## 2022-06-01 NOTE — Telephone Encounter (Signed)
Called pharmacy to see what is the alternative. Jessi states [facility] doctor quick pen or [place].Marland KitchenRaechel Chute

## 2022-06-01 NOTE — Telephone Encounter (Signed)
A pharmacist called from [facility] and said the patient's insurance no longer covers insulin detemir (LEVEMIR FLEXTOUCH) 100 UNIT/ML FlexPen . They would like to know if PCP would like to change that do a different option. Best callback for pharmacy is [phone].

## 2022-06-05 NOTE — Telephone Encounter (Signed)
What alternative is covered? Thank you 

## 2022-06-05 NOTE — Telephone Encounter (Signed)
Basaglar quick pen or [place].Marland KitchenRaechel Chute

## 2022-06-06 MED ORDER — TRESIBA FLEXTOUCH 100 UNIT/ML ~~LOC~~ SOPN: 30.0000 [IU] | PEN_INJECTOR | Freq: Every day | SUBCUTANEOUS | 5 refills | Status: AC

## 2022-06-06 NOTE — Addendum Note (Signed)
Addended by: Tresa Garter on: 06/06/2022 12:33 AM   Modules accepted: Orders

## 2022-06-06 NOTE — Telephone Encounter (Signed)
Ellin Mayhew prescription emailed to [facility] on [address].  Thanks

## 2022-08-04 ENCOUNTER — Other Ambulatory Visit: Payer: Self-pay | Admitting: Internal Medicine

## 2022-08-04 DIAGNOSIS — I10 Essential (primary) hypertension: Secondary | ICD-10-CM

## 2022-08-04 DIAGNOSIS — E039 Hypothyroidism, unspecified: Secondary | ICD-10-CM

## 2022-08-31 ENCOUNTER — Encounter: Payer: Self-pay | Admitting: Internal Medicine

## 2022-08-31 ENCOUNTER — Telehealth (INDEPENDENT_AMBULATORY_CARE_PROVIDER_SITE_OTHER): Payer: Medicare Other | Admitting: Internal Medicine

## 2022-08-31 DIAGNOSIS — N939 Abnormal uterine and vaginal bleeding, unspecified: Secondary | ICD-10-CM

## 2022-08-31 DIAGNOSIS — C7880 Secondary malignant neoplasm of unspecified digestive organ: Secondary | ICD-10-CM | POA: Insufficient documentation

## 2022-08-31 DIAGNOSIS — Z794 Long term (current) use of insulin: Secondary | ICD-10-CM | POA: Diagnosis not present

## 2022-08-31 DIAGNOSIS — C52 Malignant neoplasm of vagina: Secondary | ICD-10-CM

## 2022-08-31 DIAGNOSIS — E1142 Type 2 diabetes mellitus with diabetic polyneuropathy: Secondary | ICD-10-CM | POA: Diagnosis not present

## 2022-08-31 HISTORY — DX: Malignant neoplasm of vagina: C52

## 2022-08-31 NOTE — Assessment & Plan Note (Addendum)
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  He took her to see his doctor.  A workup was performed.  The patient is status post a remote hysterectomy.  Abdomen/pelvis CT was done on 08/09/2022: Malignant vaginal mass (I do not have a full report).  MRI of the pelvis was performed on August 20, 2022: She was found to have a malignant mass centered on the vagina with involvement of the sigmoid colon and multiple pelvic nodes consistent with a T4 lesion.  If there was a subtotal hysterectomy this could represent a primary cervical carcinoma or a vaginal primary.  The biopsy of the vaginal mass showed fragments of tissue with features of squamous cell carcinoma.  The tumor was arranged in sheets and irregular islands with an acute inflammatory infiltrate in the background.  There were no glandular elements identified in this material.  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 DVT prophylaxis with aspirin, compression socks, mobility discussed.  No vaginal bleeding now. Will refer to integrative oncology team when arrived.

## 2022-08-31 NOTE — Assessment & Plan Note (Signed)
The patient was visiting with her son's family in [place] in July when she developed a vaginal bleeding.  He took her to see his doctor.  A workup was performed.  The patient is status post a remote hysterectomy.  Abdomen/pelvis CT was done on 08/09/2022: Malignant vaginal mass (I do not have a full report).  MRI of the pelvis was performed on August 20, 2022: She was found to have a malignant mass centered on the vagina with involvement of the sigmoid colon and multiple pelvic nodes consistent with a T4 lesion.  If there was a subtotal hysterectomy this could represent a primary cervical carcinoma or a vaginal primary.  The biopsy of the vaginal mass showed fragments of tissue with features of squamous cell carcinoma.  The tumor was arranged in sheets and irregular islands with an acute inflammatory infiltrate in the background.  There were no glandular elements identified in this material.  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 DVT prophylaxis with aspirin, compression socks, mobility discussed.  No vaginal bleeding now. Will refer to integrative oncology team when arrived.

## 2022-08-31 NOTE — Progress Notes (Signed)
Virtual Visit via Video Note  I connected with Barbara White on 08/31/22 at  1:20 PM EDT by a video enabled telemedicine application and verified that I am speaking with the correct person using two identifiers.   I discussed the limitations of evaluation and management by telemedicine and the availability of in person appointments. The patient expressed understanding and agreed to proceed.  I was located at our [facility] office. The patient was in [place] There was her eldest son Barbara White present in the visit.  Chief Complaint  Patient presents with   Medication Refill     History of Present Illness: The patient was visiting with her son's family in [place] in July when she developed a vaginal bleeding.  He took her to see his doctor.  A workup was performed.  The patient is status post a remote hysterectomy.  Abdomen/pelvis CT was done on 08/09/2022: Malignant vaginal mass (I do not have a full report).  MRI of the pelvis was performed on August 20, 2022: She was found to have a malignant mass centered on the vagina with involvement of the sigmoid colon and multiple pelvic nodes consistent with a T4 lesion.  If there was a subtotal hysterectomy this could represent a primary cervical carcinoma or a vaginal primary.  The biopsy of the vaginal mass showed fragments of tissue with features of squamous cell carcinoma.  The tumor was arranged in sheets and irregular islands with an acute inflammatory infiltrate in the background.  There were no glandular elements identified in this material.  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 Negative for fever.   Respiratory:  Negative for cough, hemoptysis, sputum production and shortness of breath.   Cardiovascular:  Negative for chest pain, palpitations, orthopnea and leg swelling.  Gastrointestinal:  Negative for abdominal pain and constipation.  Genitourinary:  Negative for flank pain and urgency.  Musculoskeletal:  Negative for back pain and myalgias.  Neurological:  Negative for focal weakness.  Psychiatric/Behavioral:  Negative for memory loss.      Observations/Objective: The patient appears to be in no acute distress.  She is feeling normal now.  Her sugars are good.    A total time of 45 minutes was spent  reviewing tests, x-rays, operative reports and other medical records.  Also, obtaining history and performing comprehensive physical exam.  Additionally, counseling the patient regarding the above listed issues.   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  It is a complex case.   Assessment and Plan:  Problem List Items Addressed This Visit     DM (diabetes mellitus) (HCC)    Reportedly her sugars are well-controlled.      Vaginal cancer [facility]) - Primary    The patient was visiting with her son's family in [place] in July when she developed a vaginal bleeding.  He took her to see his doctor.  A workup was performed.  The patient is status post a remote hysterectomy.  Abdomen/pelvis CT was done on 08/09/2022: Malignant vaginal mass (I do not have a full report).  MRI of the pelvis was performed on August 20, 2022: She was  found to have a malignant mass centered on the vagina with involvement of the sigmoid colon and multiple pelvic nodes consistent with a T4 lesion.  If there was a subtotal hysterectomy this could represent a primary cervical carcinoma or a vaginal primary.  The biopsy of the vaginal mass showed fragments of tissue with features of squamous cell carcinoma.  The  tumor was arranged in sheets and irregular islands with an acute inflammatory infiltrate in the background.  There were no glandular elements identified in this material.  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 DVT prophylaxis with aspirin, compression socks, mobility discussed.  No vaginal bleeding now. Will refer to integrative oncology team when arrived.      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  He took her to see his doctor.  A workup was performed.  The patient is status post a remote hysterectomy.  Abdomen/pelvis CT was done on 08/09/2022: Malignant vaginal mass (I do not have a full report).  MRI of the pelvis was performed on August 20, 2022: She was found to have a malignant mass centered on the vagina with involvement of the sigmoid colon and multiple pelvic nodes consistent with a T4 lesion.  If there was a subtotal hysterectomy this could represent a primary cervical carcinoma or a vaginal primary.  The biopsy of the vaginal mass showed fragments of tissue with features of squamous cell carcinoma.  The tumor was arranged in sheets and irregular islands with an acute inflammatory infiltrate in the background.  There were no glandular elements identified in this material.  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 DVT prophylaxis with aspirin, compression socks, mobility discussed.  No vaginal bleeding now. Will refer to integrative oncology team when arrived.        No orders of the defined types were placed in this encounter.    Follow Up Instructions:    I discussed the assessment and treatment plan with the patient. The patient was provided an opportunity to ask questions and all were answered. The patient agreed with the plan and demonstrated an understanding of the instructions.   The patient was advised to call back or seek an in-person evaluation if the symptoms worsen or if the condition fails to improve as anticipated.  I provided face-to-face time during this encounter. We were at different locations.   Sonda Primes, MD

## 2022-08-31 NOTE — Assessment & Plan Note (Signed)
The patient was visiting with her son's family in [place] in July when she developed a vaginal bleeding.  He took her to see his doctor.  A workup was performed.  The patient is status post a remote hysterectomy.  Abdomen/pelvis CT was done on 08/09/2022: Malignant vaginal mass (I do not have a full report).  MRI of the pelvis was performed on August 20, 2022: She was found to have a malignant mass centered on the vagina with involvement of the sigmoid colon and multiple pelvic nodes consistent with a T4 lesion.  If there was a subtotal hysterectomy this could represent a primary cervical carcinoma or a vaginal primary.  The biopsy of the vaginal mass showed fragments of tissue with features of squamous cell carcinoma.  The tumor was arranged in sheets and irregular islands with an acute inflammatory infiltrate in the background.  There were no glandular elements identified in this material. New. 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  We discussed DVT prophylaxis with aspirin/compression knee-high's/mobility. Will refer to oncology integrative team upon arrival.

## 2022-08-31 NOTE — Assessment & Plan Note (Signed)
Reportedly her sugars are well-controlled.

## 2022-09-03 ENCOUNTER — Other Ambulatory Visit: Payer: Self-pay | Admitting: Internal Medicine

## 2022-12-14 ENCOUNTER — Emergency Department: Payer: Medicare Other

## 2022-12-14 ENCOUNTER — Other Ambulatory Visit: Payer: Self-pay

## 2022-12-14 ENCOUNTER — Emergency Department
Admission: EM | Admit: 2022-12-14 | Discharge: 2022-12-14 | Discharge status: Against Medical Advice | Payer: Medicare Other | Attending: Emergency Medicine | Admitting: Emergency Medicine

## 2022-12-14 DIAGNOSIS — N938 Other specified abnormal uterine and vaginal bleeding: Secondary | ICD-10-CM | POA: Insufficient documentation

## 2022-12-14 DIAGNOSIS — R1900 Intra-abdominal and pelvic swelling, mass and lump, unspecified site: Secondary | ICD-10-CM | POA: Insufficient documentation

## 2022-12-14 DIAGNOSIS — N939 Abnormal uterine and vaginal bleeding, unspecified: Secondary | ICD-10-CM

## 2022-12-14 HISTORY — DX: Malignant neoplasm of unspecified ovary: C56.9

## 2022-12-14 LAB — BASIC METABOLIC PANEL
Anion Gap: 9 (ref 5.0–15.0)
BUN: 19 mg/dL (ref 7–21)
CO2: 25 meq/L (ref 17–29)
Calcium: 9.8 mg/dL (ref 7.9–10.2)
Chloride: 104 meq/L (ref 99–111)
Creatinine: 1.1 mg/dL — ABNORMAL HIGH (ref 0.4–1.0)
GFR: 52.8 mL/min/{1.73_m2} — ABNORMAL LOW (ref >=60.0)
Glucose: 107 mg/dL — ABNORMAL HIGH (ref 70–100)
Potassium: 4 meq/L (ref 3.5–5.3)
Sodium: 138 meq/L (ref 135–145)

## 2022-12-14 LAB — LAB USE ONLY - CBC WITH DIFFERENTIAL
Absolute Basophils: 0.04 10*3/uL (ref 0.00–0.08)
Absolute Eosinophils: 0.36 10*3/uL (ref 0.00–0.44)
Absolute Immature Granulocytes: 0.04 10*3/uL (ref 0.00–0.07)
Absolute Lymphocytes: 2.25 10*3/uL (ref 0.42–3.22)
Absolute Monocytes: 0.54 10*3/uL (ref 0.21–0.85)
Absolute Neutrophils: 3.96 10*3/uL (ref 1.10–6.33)
Absolute nRBC: 0 10*3/uL (ref <=0.00)
Basophils %: 0.6 %
Eosinophils %: 5 %
Hematocrit: 36.7 % (ref 34.7–43.7)
Hemoglobin: 11.4 g/dL (ref 11.4–14.8)
Immature Granulocytes %: 0.6 %
Lymphocytes %: 31.3 %
MCH: 25 pg — ABNORMAL LOW (ref 25.1–33.5)
MCHC: 31.1 g/dL — ABNORMAL LOW (ref 31.5–35.8)
MCV: 80.5 fL (ref 78.0–96.0)
MPV: 8.7 fL — ABNORMAL LOW (ref 8.9–12.5)
Monocytes %: 7.5 %
Neutrophils %: 55 %
Platelet Count: 354 10*3/uL — ABNORMAL HIGH (ref 142–346)
Preliminary Absolute Neutrophil Count: 3.96 10*3/uL (ref 1.10–6.33)
RBC: 4.56 10*6/uL (ref 3.90–5.10)
RDW: 13 % (ref 11–15)
WBC: 7.19 10*3/uL (ref 3.10–9.50)
nRBC %: 0 /100{WBCs} (ref <=0.0)

## 2022-12-14 LAB — TYPE AND SCREEN
ABO Rh: A POS
Antibody Screen: NEGATIVE

## 2022-12-14 LAB — WHOLE BLOOD GLUCOSE POCT: Whole Blood Glucose POCT: 88 mg/dL (ref 70–100)

## 2022-12-14 NOTE — ED Provider Notes (Signed)
 [facility] EMERGENCY DEPARTMENT H&P      Visit date: 12/14/2022      CLINICAL SUMMARY           Diagnosis:    .     Final diagnoses:   Pelvic mass   Vaginal bleeding         MDM Notes:      Medical Decision Making  Patient p

## 2022-12-14 NOTE — Discharge Instr - AVS First Page (Signed)
 Return to ED if soaking more than 1 pad per hour. Having dizziness, lightheadedness, palpations, chest pain.

## 2022-12-14 NOTE — ED Notes (Signed)
Pt requesting to leave AMA, follow-up information given my provider. Pt encouraged to follow-up/return should symptoms continue/worsen.

## 2022-12-14 NOTE — Consults (Signed)
 GYNECOLOGY ONCOLOGY HISTORY AND PHYSICAL     Name Number  Hours    1st Georgina Pillion ONC NP [facility] [phone] OR [id number]  M-F [phone]   2nd CALL Resident [facility] [phone]  Everyday [phone]    3rd CALL GYN ONC Fellow [facility] [phone]  [phone]

## 2022-12-14 NOTE — ED Triage Notes (Signed)
 Pt reports to ED with c/o bright red vaginal bleeding following pap smear at doctors, pt recently diagnosed with ovarian cancer and has received no treatment for recent dx. Pt report mild pain. Per family bleeding subsided greatly since leaving doctors off

## 2023-01-12 DIAGNOSIS — E119 Type 2 diabetes mellitus without complications: Secondary | ICD-10-CM | POA: Diagnosis not present

## 2023-01-12 DIAGNOSIS — C52 Malignant neoplasm of vagina: Secondary | ICD-10-CM | POA: Diagnosis not present

## 2023-01-12 DIAGNOSIS — I10 Essential (primary) hypertension: Secondary | ICD-10-CM | POA: Diagnosis not present

## 2023-01-12 DIAGNOSIS — Z1331 Encounter for screening for depression: Secondary | ICD-10-CM | POA: Diagnosis not present

## 2023-01-12 DIAGNOSIS — E039 Hypothyroidism, unspecified: Secondary | ICD-10-CM | POA: Diagnosis not present

## 2023-01-12 DIAGNOSIS — G252 Other specified forms of tremor: Secondary | ICD-10-CM | POA: Diagnosis not present

## 2023-01-12 DIAGNOSIS — G250 Essential tremor: Secondary | ICD-10-CM | POA: Diagnosis not present

## 2023-01-12 DIAGNOSIS — E782 Mixed hyperlipidemia: Secondary | ICD-10-CM | POA: Diagnosis not present

## 2023-01-13 DIAGNOSIS — E782 Mixed hyperlipidemia: Secondary | ICD-10-CM | POA: Diagnosis not present

## 2023-01-13 DIAGNOSIS — I10 Essential (primary) hypertension: Secondary | ICD-10-CM | POA: Diagnosis not present

## 2023-01-13 DIAGNOSIS — E119 Type 2 diabetes mellitus without complications: Secondary | ICD-10-CM | POA: Diagnosis not present

## 2023-01-13 DIAGNOSIS — E039 Hypothyroidism, unspecified: Secondary | ICD-10-CM | POA: Diagnosis not present

## 2023-01-13 DIAGNOSIS — C52 Malignant neoplasm of vagina: Secondary | ICD-10-CM | POA: Diagnosis not present

## 2023-01-18 ENCOUNTER — Other Ambulatory Visit: Payer: Self-pay | Admitting: Internal Medicine

## 2023-01-18 DIAGNOSIS — E782 Mixed hyperlipidemia: Secondary | ICD-10-CM | POA: Diagnosis not present

## 2023-01-18 DIAGNOSIS — N899 Noninflammatory disorder of vagina, unspecified: Secondary | ICD-10-CM | POA: Diagnosis not present

## 2023-01-18 DIAGNOSIS — G252 Other specified forms of tremor: Secondary | ICD-10-CM | POA: Diagnosis not present

## 2023-01-18 DIAGNOSIS — C786 Secondary malignant neoplasm of retroperitoneum and peritoneum: Secondary | ICD-10-CM | POA: Diagnosis not present

## 2023-01-18 DIAGNOSIS — N1831 Chronic kidney disease, stage 3a: Secondary | ICD-10-CM | POA: Diagnosis not present

## 2023-01-18 DIAGNOSIS — C52 Malignant neoplasm of vagina: Secondary | ICD-10-CM | POA: Diagnosis not present

## 2023-01-18 DIAGNOSIS — I10 Essential (primary) hypertension: Secondary | ICD-10-CM | POA: Diagnosis not present

## 2023-01-18 DIAGNOSIS — K654 Sclerosing mesenteritis: Secondary | ICD-10-CM | POA: Diagnosis not present

## 2023-01-18 DIAGNOSIS — E039 Hypothyroidism, unspecified: Secondary | ICD-10-CM | POA: Diagnosis not present

## 2023-01-18 DIAGNOSIS — E119 Type 2 diabetes mellitus without complications: Secondary | ICD-10-CM | POA: Diagnosis not present

## 2023-01-25 DIAGNOSIS — H40013 Open angle with borderline findings, low risk, bilateral: Secondary | ICD-10-CM | POA: Diagnosis not present

## 2023-01-25 DIAGNOSIS — H353132 Nonexudative age-related macular degeneration, bilateral, intermediate dry stage: Secondary | ICD-10-CM | POA: Diagnosis not present

## 2023-01-25 DIAGNOSIS — H04123 Dry eye syndrome of bilateral lacrimal glands: Secondary | ICD-10-CM | POA: Diagnosis not present

## 2023-01-31 DIAGNOSIS — C52 Malignant neoplasm of vagina: Secondary | ICD-10-CM | POA: Diagnosis not present

## 2023-01-31 DIAGNOSIS — I10 Essential (primary) hypertension: Secondary | ICD-10-CM | POA: Diagnosis not present

## 2023-01-31 DIAGNOSIS — E039 Hypothyroidism, unspecified: Secondary | ICD-10-CM | POA: Diagnosis not present

## 2023-01-31 DIAGNOSIS — E782 Mixed hyperlipidemia: Secondary | ICD-10-CM | POA: Diagnosis not present

## 2023-01-31 DIAGNOSIS — E119 Type 2 diabetes mellitus without complications: Secondary | ICD-10-CM | POA: Diagnosis not present

## 2023-01-31 DIAGNOSIS — C786 Secondary malignant neoplasm of retroperitoneum and peritoneum: Secondary | ICD-10-CM | POA: Diagnosis not present
